# Patient Record
Sex: Female | Born: 1945 | Race: White | Hispanic: No | Marital: Married | Smoking: Never smoker
Health system: Southern US, Community
[De-identification: ages and names within clinical notes are randomized; demographics above are authoritative.]

## PROBLEM LIST (undated history)

## (undated) DIAGNOSIS — M1990 Unspecified osteoarthritis, unspecified site: Secondary | ICD-10-CM

## (undated) DIAGNOSIS — E049 Nontoxic goiter, unspecified: Secondary | ICD-10-CM

## (undated) DIAGNOSIS — E039 Hypothyroidism, unspecified: Secondary | ICD-10-CM

## (undated) DIAGNOSIS — E7800 Pure hypercholesterolemia, unspecified: Secondary | ICD-10-CM

## (undated) DIAGNOSIS — C4491 Basal cell carcinoma of skin, unspecified: Secondary | ICD-10-CM

## (undated) DIAGNOSIS — F32A Depression, unspecified: Secondary | ICD-10-CM

## (undated) DIAGNOSIS — M8580 Other specified disorders of bone density and structure, unspecified site: Secondary | ICD-10-CM

## (undated) DIAGNOSIS — F319 Bipolar disorder, unspecified: Secondary | ICD-10-CM

## (undated) DIAGNOSIS — F329 Major depressive disorder, single episode, unspecified: Secondary | ICD-10-CM

## (undated) HISTORY — DX: Basal cell carcinoma of skin, unspecified: C44.91

## (undated) HISTORY — DX: Bipolar disorder, unspecified: F31.9

## (undated) HISTORY — DX: Nontoxic goiter, unspecified: E04.9

## (undated) HISTORY — PX: TUBAL LIGATION: SHX77

## (undated) HISTORY — DX: Depression, unspecified: F32.A

## (undated) HISTORY — PX: JOINT REPLACEMENT: SHX530

## (undated) HISTORY — DX: Other specified disorders of bone density and structure, unspecified site: M85.80

## (undated) HISTORY — DX: Pure hypercholesterolemia, unspecified: E78.00

## (undated) HISTORY — DX: Major depressive disorder, single episode, unspecified: F32.9

---

## 2004-01-22 ENCOUNTER — Ambulatory Visit: Payer: Self-pay | Admitting: Family Medicine

## 2005-03-28 ENCOUNTER — Ambulatory Visit: Payer: Self-pay | Admitting: Family Medicine

## 2006-04-03 ENCOUNTER — Ambulatory Visit: Payer: Self-pay | Admitting: Family Medicine

## 2006-04-10 ENCOUNTER — Ambulatory Visit: Payer: Self-pay | Admitting: Urology

## 2007-05-06 ENCOUNTER — Ambulatory Visit: Payer: Self-pay

## 2008-01-18 ENCOUNTER — Ambulatory Visit: Payer: Self-pay | Admitting: Otolaryngology

## 2008-03-17 HISTORY — PX: THYROID SURGERY: SHX805

## 2008-05-08 ENCOUNTER — Ambulatory Visit: Payer: Self-pay | Admitting: Family Medicine

## 2008-08-03 ENCOUNTER — Ambulatory Visit: Payer: Self-pay | Admitting: Otolaryngology

## 2009-05-09 ENCOUNTER — Ambulatory Visit: Payer: Self-pay | Admitting: Family Medicine

## 2009-08-21 ENCOUNTER — Ambulatory Visit: Payer: Self-pay | Admitting: Otolaryngology

## 2009-09-10 ENCOUNTER — Ambulatory Visit: Payer: Self-pay | Admitting: Otolaryngology

## 2009-09-13 ENCOUNTER — Ambulatory Visit: Payer: Self-pay | Admitting: Otolaryngology

## 2009-09-24 ENCOUNTER — Ambulatory Visit: Payer: Self-pay | Admitting: Otolaryngology

## 2009-09-25 LAB — PATHOLOGY REPORT

## 2010-05-10 ENCOUNTER — Ambulatory Visit: Payer: Self-pay | Admitting: Family Medicine

## 2011-06-06 ENCOUNTER — Ambulatory Visit: Payer: Self-pay | Admitting: Family Medicine

## 2012-06-07 ENCOUNTER — Ambulatory Visit: Payer: Self-pay | Admitting: Family Medicine

## 2012-06-08 ENCOUNTER — Ambulatory Visit: Payer: Self-pay | Admitting: Family Medicine

## 2013-05-17 ENCOUNTER — Ambulatory Visit: Payer: Self-pay | Admitting: Family Medicine

## 2013-06-08 ENCOUNTER — Ambulatory Visit: Payer: Self-pay | Admitting: Family Medicine

## 2014-03-29 ENCOUNTER — Ambulatory Visit: Payer: Self-pay | Admitting: Psychology

## 2014-03-31 ENCOUNTER — Ambulatory Visit: Payer: Self-pay | Admitting: Family Medicine

## 2014-03-31 DIAGNOSIS — J449 Chronic obstructive pulmonary disease, unspecified: Secondary | ICD-10-CM | POA: Diagnosis not present

## 2014-03-31 DIAGNOSIS — J439 Emphysema, unspecified: Secondary | ICD-10-CM | POA: Diagnosis not present

## 2014-03-31 DIAGNOSIS — J40 Bronchitis, not specified as acute or chronic: Secondary | ICD-10-CM | POA: Diagnosis not present

## 2014-03-31 DIAGNOSIS — J309 Allergic rhinitis, unspecified: Secondary | ICD-10-CM | POA: Diagnosis not present

## 2014-03-31 DIAGNOSIS — R05 Cough: Secondary | ICD-10-CM | POA: Diagnosis not present

## 2014-03-31 DIAGNOSIS — J4520 Mild intermittent asthma, uncomplicated: Secondary | ICD-10-CM | POA: Diagnosis not present

## 2014-04-28 DIAGNOSIS — F319 Bipolar disorder, unspecified: Secondary | ICD-10-CM | POA: Diagnosis not present

## 2014-04-28 DIAGNOSIS — Z0000 Encounter for general adult medical examination without abnormal findings: Secondary | ICD-10-CM | POA: Diagnosis not present

## 2014-04-28 DIAGNOSIS — E780 Pure hypercholesterolemia: Secondary | ICD-10-CM | POA: Diagnosis not present

## 2014-05-04 ENCOUNTER — Ambulatory Visit: Payer: Self-pay | Admitting: Family Medicine

## 2014-05-04 DIAGNOSIS — J449 Chronic obstructive pulmonary disease, unspecified: Secondary | ICD-10-CM | POA: Diagnosis not present

## 2014-05-04 DIAGNOSIS — F319 Bipolar disorder, unspecified: Secondary | ICD-10-CM | POA: Diagnosis not present

## 2014-05-04 DIAGNOSIS — R05 Cough: Secondary | ICD-10-CM | POA: Diagnosis not present

## 2014-05-04 DIAGNOSIS — E780 Pure hypercholesterolemia: Secondary | ICD-10-CM | POA: Diagnosis not present

## 2014-05-09 DIAGNOSIS — Z0000 Encounter for general adult medical examination without abnormal findings: Secondary | ICD-10-CM | POA: Diagnosis not present

## 2014-05-09 DIAGNOSIS — F319 Bipolar disorder, unspecified: Secondary | ICD-10-CM | POA: Diagnosis not present

## 2014-05-09 DIAGNOSIS — R05 Cough: Secondary | ICD-10-CM | POA: Diagnosis not present

## 2014-05-23 ENCOUNTER — Encounter: Payer: Self-pay | Admitting: Internal Medicine

## 2014-05-23 ENCOUNTER — Ambulatory Visit (INDEPENDENT_AMBULATORY_CARE_PROVIDER_SITE_OTHER): Payer: Commercial Managed Care - HMO | Admitting: Internal Medicine

## 2014-05-23 VITALS — BP 132/74 | HR 74 | Temp 98.2°F | Ht 66.5 in | Wt 166.0 lb

## 2014-05-23 DIAGNOSIS — R05 Cough: Secondary | ICD-10-CM

## 2014-05-23 DIAGNOSIS — J449 Chronic obstructive pulmonary disease, unspecified: Secondary | ICD-10-CM | POA: Insufficient documentation

## 2014-05-23 DIAGNOSIS — R059 Cough, unspecified: Secondary | ICD-10-CM | POA: Insufficient documentation

## 2014-05-23 DIAGNOSIS — J439 Emphysema, unspecified: Secondary | ICD-10-CM | POA: Diagnosis not present

## 2014-05-23 NOTE — Patient Instructions (Signed)
Follow up with Dr. Stevenson Clinch in 1 month - pulmonary function testing and 6 minute walk test prior to follow up - continue with flonase and singulair.

## 2014-05-24 ENCOUNTER — Encounter: Payer: Self-pay | Admitting: Internal Medicine

## 2014-05-24 NOTE — Progress Notes (Signed)
Date: 05/24/2014  MRN# [id number] Felicia Ross [birth year]  Referring Physician: Dr. Margarita Rana  Felicia Ross is a 69 y.o. old female seen in consultation for cough  CC: "cough for the past 1-2 years."  Chief Complaint  Patient presents with  . Advice Only    pt has a longstanding cough. She gets choked frequently after thyroid surgery (2011). Pt denies chest tightness, wheezing or mucus.    HPI:  Patient is a pleasant 69 year old female past medical history of cough, hematuria, hypercholesterolemia, thyroid goiter status post resection being evaluated for a cough over the past 23 years. Patient describes the cough as "tickle in throat" it's been more prominent over the past one year. She saw her primary care physician who obtained chest x-rays that showed hyperinflation and findings consistent with COPD. Over the past one to 2 months she's been started on Flonase, Singulair, and states that this has provided relief. Patient states that she has to chew hard candy (peppermint) which alleviates the cough and Susan throat. She cannot identify any factors that exacerbate her cough will make her cough better besides the peppermint. She does not endorse any cough relation to seasonal changes or environmental allergens. She is a never smoker, but does have secondhand smoke smoker for 19 years. 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 Cough is nonproductive he occur 3-5 minutes at a time and is intermittent. She states that after the thyroid surgery in 2011 the cough went away for a few months but then returned. No recent sick contacts or illnesses, no recent hospitalizations for bronchitis or any recurrent upper respiratory tract infections. Only symptoms patient endorses are cough, runny nose, and nasal congestion. She denies any dyspnea, wheezing, chills, fever, sore throat, pleuritic chest pain, vomiting, nausea.   PMHX:   Past Medical History  Diagnosis Date  . Allergic  bronchitis   . Bipolar disorder   . Depression   . Cough   . Edema   . Hematuria   . Hypercholesteremia   . Osteopenia   . SOB (shortness of breath)   . Thyroid goiter   . Allergic rhinitis    Surgical Hx:  Past Surgical History  Procedure Laterality Date  . Tubal ligation    . Thyroid surgery     Family Hx:  No family history on file. Social Hx:   History  Substance Use Topics  . Smoking status: Never Smoker   . Smokeless tobacco: Never Used  . Alcohol Use: No   Medication:   Current Outpatient Rx  Name  Route  Sig  Dispense  Refill  . cholecalciferol (VITAMIN D) 1000 UNITS tablet   Oral   Take 1,000 Units by mouth daily.         . fluticasone (FLONASE) 50 MCG/ACT nasal spray   Each Nare   Place 2 sprays into both nostrils daily.         Marland Kitchen levothyroxine (SYNTHROID, LEVOTHROID) 112 MCG tablet   Oral   Take 112 mcg by mouth daily.      1   . lithium carbonate (LITHOBID) 300 MG CR tablet   Oral   Take 300 mg by mouth 2 (two) times daily.         . mirtazapine (REMERON) 15 MG tablet   Oral   Take 15 mg by mouth at bedtime.         . montelukast (SINGULAIR) 10 MG tablet   Oral   Take 10 mg  by mouth daily.         . Multiple Vitamin (MULTIVITAMIN) tablet   Oral   Take 1 tablet by mouth daily.         . Omega-3 Fatty Acids (FISH OIL) 1200 MG CAPS   Oral   Take 1,200 mg by mouth daily.             Allergies:  Review of patient's allergies indicates no known allergies.  Review of Systems: Gen:  Denies  fever, sweats, chills HEENT: Denies blurred vision, double vision, ear pain, eye pain, hearing loss.  Admit to nasal congestion and runny nose Cvc:  No dizziness, chest pain or heaviness Resp:   cough Gi: Denies swallowing difficulty, stomach pain, nausea or vomiting, diarrhea, constipation, bowel incontinence Gu:  Denies bladder incontinence, burning urine Ext:   No Joint pain, stiffness or swelling Skin: No skin rash, easy bruising  or bleeding or hives Endoc:  No polyuria, polydipsia , polyphagia or weight change Psych: No depression, insomnia or hallucinations  Other:  All other systems negative  Physical Examination:   VS: BP 132/74 mmHg  Pulse 74  Temp(Src) 98.2 F (36.8 C) (Oral)  Ht 5' 6.5" (1.689 m)  Wt 166 lb (75.297 kg)  BMI 26.39 kg/m2  SpO2 97%  General Appearance: No distress  Neuro:without focal findings, mental status, speech normal, alert and oriented, cranial nerves 2-12 intact, reflexes normal and symmetric, sensation grossly normal  HEENT: PERRLA, EOM intact, no ptosis, no other lesions noticed; Mallampati  Pulmonary: normal breath sounds., diaphragmatic excursion normal.No wheezing, No rales;   Sputum Production:  None CardiovascularNormal S1,S2.  No m/r/g.  Abdominal aorta pulsation normal.    Abdomen: Benign, Soft, non-tender, No masses, hepatosplenomegaly, No lymphadenopathy Renal:  No costovertebral tenderness  GU:  No performed at this time. Endoc: No evident thyromegaly, no signs of acromegaly or Cushing features Skin:   warm, no rashes, no ecchymosis  Extremities: normal, no cyanosis, clubbing, no edema, warm with normal capillary refill. Other findings:   Rad results: (The following images and results were reviewed by Dr. Stevenson Clinch). CXR 05/04/14 CLINICAL DATA: History of COPD and chronic nonproductive cough for the past year  EXAM: CHEST 2 VIEW  COMPARISON: PA and lateral chest x-ray of March 31, 2014  FINDINGS: The lungs remain hyperinflated with mild hemidiaphragm flattening. There is no focal infiltrate. There is stable apical pleural thickening bilaterally. There is no pleural effusion. The heart and pulmonary vascularity are normal. The mediastinum is normal in width. There is gentle S-shaped thoracic scoliosis with mild degenerative disc disease at multiple levels.  IMPRESSION: COPD. There is no pneumonia nor other active cardiopulmonary disease.  03/31/14 CHEST 2  VIEW  COMPARISON: None.  FINDINGS: The lungs are hyperinflated. There is peribronchial thickening. No infiltrates or effusions. Slight irregular apical pleural thickening. Heart size and pulmonary vascularity are normal. No acute osseous abnormality.  IMPRESSION: Bronchitic changes with emphysema.     Assessment and Plan:69 year old female past medical history of hypothyroidism being evaluated for cough. Cough The standardized cough guidelines published in Chest by Lissa Morales in 2006 are still the best available and consist of a multiple step process (up to 12!) , not a single office visit,  and are intended  to address this problem logically,  with an alogrithm dependent on response to empiric treatment at  each progressive step  to determine a specific diagnosis with  minimal addtional testing needed. Therefore if adherence is an issue or can't be accurately  verified,  it's very unlikely the standard evaluation and treatment will be successful here.    Furthermore, response to therapy (other than acute cough suppression, which should only be used short term with avoidance of narcotic containing cough syrups if possible), can be a gradual process for which the patient may perceive immediate benefit.  Patient cough is likely secondary to allergies and COPD.  Plan: - See plan for COPD - continue with Flonase and Singulair    COPD (chronic obstructive pulmonary disease) Clinically only symptom is cough, however by imaging patient does have hyperinflated lungs and flattening of the diaphragms which will commonly seen in COPD patients. Patient does not have a smoking history only secondhand smoke exposure. While smoking is a common predisposition for COPD other factors include woodburning fuel, noxious airborne substances, secondhand smoke exposure.   Currently cough is not causing significant decrease in quality of life with daily activities therefore would not place patient on  maintenance daily therapy at this time.  Plan: -Pulmonary function testing and 6 minute walk test. -Continue supportive care.     Updated Medication List Outpatient Encounter Prescriptions as of 05/23/2014  Medication Sig  . cholecalciferol (VITAMIN D) 1000 UNITS tablet Take 1,000 Units by mouth daily.  . fluticasone (FLONASE) 50 MCG/ACT nasal spray Place 2 sprays into both nostrils daily.  Marland Kitchen levothyroxine (SYNTHROID, LEVOTHROID) 112 MCG tablet Take 112 mcg by mouth daily.  Marland Kitchen lithium carbonate (LITHOBID) 300 MG CR tablet Take 300 mg by mouth 2 (two) times daily.  . mirtazapine (REMERON) 15 MG tablet Take 15 mg by mouth at bedtime.  . montelukast (SINGULAIR) 10 MG tablet Take 10 mg by mouth daily.  . Multiple Vitamin (MULTIVITAMIN) tablet Take 1 tablet by mouth daily.  . Omega-3 Fatty Acids (FISH OIL) 1200 MG CAPS Take 1,200 mg by mouth daily.    Orders for this visit: Orders Placed This Encounter  Procedures  . Pulmonary function test    Standing Status: Future     Number of Occurrences:      Standing Expiration Date: 05/23/2015    Scheduling Instructions:     This has been scheduled for 06/27/14.    Order Specific Question:  Where should this test be performed?    Answer:  [facility]    Order Specific Question:  Full PFT: includes the following: basic spirometry, spirometry pre & post bronchodilator, diffusion capacity (DLCO), lung volumes    Answer:  Full PFT    Order Specific Question:  MIP/MEP    Answer:  No    Order Specific Question:  6 minute walk    Answer:  Yes    Order Specific Question:  ABG    Answer:  No    Order Specific Question:  Diffusion capacity (DLCO)    Answer:  Yes    Order Specific Question:  Lung volumes    Answer:  Yes    Order Specific Question:  Methacholine challenge    Answer:  No     Thank  you for the consultation and for allowing [facility] to assist in the care of your patient. Our recommendations are noted  above.  Please contact us if we can be of further service.   Vilinda Boehringer, MD [facility] Office Number: [phone]

## 2014-05-24 NOTE — Assessment & Plan Note (Signed)
Clinically only symptom is cough, however by imaging patient does have hyperinflated lungs and flattening of the diaphragms which will commonly seen in COPD patients. Patient does not have a smoking history only secondhand smoke exposure. While smoking is a common predisposition for COPD other factors include woodburning fuel, noxious airborne substances, secondhand smoke exposure.   Currently cough is not causing significant decrease in quality of life with daily activities therefore would not place patient on maintenance daily therapy at this time.  Plan: -Pulmonary function testing and 6 minute walk test. -Continue supportive care.

## 2014-05-24 NOTE — Assessment & Plan Note (Addendum)
The standardized cough guidelines published in Chest by Lissa Morales in 2006 are still the best available and consist of a multiple step process (up to 12!) , not a single office visit,  and are intended  to address this problem logically,  with an alogrithm dependent on response to empiric treatment at  each progressive step  to determine a specific diagnosis with  minimal addtional testing needed. Therefore if adherence is an issue or can't be accurately verified,  it's very unlikely the standard evaluation and treatment will be successful here.    Furthermore, response to therapy (other than acute cough suppression, which should only be used short term with avoidance of narcotic containing cough syrups if possible), can be a gradual process for which the patient may perceive immediate benefit.  Patient cough is likely secondary to allergies and COPD.  Plan: - See plan for COPD - continue with Flonase and Singulair

## 2014-06-12 ENCOUNTER — Ambulatory Visit: Payer: Self-pay | Admitting: Family Medicine

## 2014-06-12 DIAGNOSIS — Z1231 Encounter for screening mammogram for malignant neoplasm of breast: Secondary | ICD-10-CM | POA: Diagnosis not present

## 2014-06-20 DIAGNOSIS — E780 Pure hypercholesterolemia: Secondary | ICD-10-CM | POA: Diagnosis not present

## 2014-06-20 DIAGNOSIS — E049 Nontoxic goiter, unspecified: Secondary | ICD-10-CM | POA: Diagnosis not present

## 2014-06-27 ENCOUNTER — Ambulatory Visit: Payer: Commercial Managed Care - HMO

## 2014-06-28 ENCOUNTER — Ambulatory Visit: Payer: Commercial Managed Care - HMO | Admitting: Internal Medicine

## 2014-07-03 DIAGNOSIS — F319 Bipolar disorder, unspecified: Secondary | ICD-10-CM | POA: Diagnosis not present

## 2014-07-03 DIAGNOSIS — L237 Allergic contact dermatitis due to plants, except food: Secondary | ICD-10-CM | POA: Diagnosis not present

## 2014-07-03 DIAGNOSIS — Z0000 Encounter for general adult medical examination without abnormal findings: Secondary | ICD-10-CM | POA: Diagnosis not present

## 2014-07-04 DIAGNOSIS — L600 Ingrowing nail: Secondary | ICD-10-CM | POA: Diagnosis not present

## 2014-07-04 DIAGNOSIS — B351 Tinea unguium: Secondary | ICD-10-CM | POA: Diagnosis not present

## 2014-07-04 DIAGNOSIS — M79674 Pain in right toe(s): Secondary | ICD-10-CM | POA: Diagnosis not present

## 2014-07-04 DIAGNOSIS — M79675 Pain in left toe(s): Secondary | ICD-10-CM | POA: Diagnosis not present

## 2014-08-08 ENCOUNTER — Ambulatory Visit: Payer: Commercial Managed Care - HMO

## 2014-08-08 ENCOUNTER — Ambulatory Visit: Payer: Commercial Managed Care - HMO | Admitting: Internal Medicine

## 2014-08-09 ENCOUNTER — Ambulatory Visit: Payer: Commercial Managed Care - HMO | Admitting: Internal Medicine

## 2014-08-09 ENCOUNTER — Ambulatory Visit: Payer: Commercial Managed Care - HMO

## 2014-08-21 ENCOUNTER — Telehealth: Payer: Self-pay | Admitting: *Deleted

## 2014-08-21 NOTE — Telephone Encounter (Signed)
Called and spoke to patient's spouse.  Pt needs to come in at 8:30 instead of 9:00. Pts husband says he will relay the message.

## 2014-08-24 ENCOUNTER — Ambulatory Visit (INDEPENDENT_AMBULATORY_CARE_PROVIDER_SITE_OTHER): Payer: Commercial Managed Care - HMO | Admitting: Internal Medicine

## 2014-08-24 ENCOUNTER — Ambulatory Visit: Payer: Commercial Managed Care - HMO | Admitting: Internal Medicine

## 2014-08-24 ENCOUNTER — Encounter: Payer: Self-pay | Admitting: Internal Medicine

## 2014-08-24 VITALS — BP 134/82 | HR 72 | Temp 98.0°F | Ht 67.0 in | Wt 167.0 lb

## 2014-08-24 DIAGNOSIS — R05 Cough: Secondary | ICD-10-CM

## 2014-08-24 DIAGNOSIS — R059 Cough, unspecified: Secondary | ICD-10-CM

## 2014-08-24 DIAGNOSIS — J439 Emphysema, unspecified: Secondary | ICD-10-CM | POA: Diagnosis not present

## 2014-08-24 DIAGNOSIS — J449 Chronic obstructive pulmonary disease, unspecified: Secondary | ICD-10-CM

## 2014-08-24 LAB — PULMONARY FUNCTION TEST
DL/VA % pred: 94 %
DL/VA: 4.86 ml/min/mmHg/L
DLCO unc % pred: 123 %
DLCO unc: 34.98 ml/min/mmHg
FEF 25-75 Post: 1.38 L/sec
FEF 25-75 Pre: 1.09 L/sec
FEF2575-%Change-Post: 26 %
FEF2575-%Pred-Post: 62 %
FEF2575-%Pred-Pre: 49 %
FEV1-%Change-Post: 7 %
FEV1-%Pred-Post: 69 %
FEV1-%Pred-Pre: 65 %
FEV1-Post: 1.84 L
FEV1-Pre: 1.72 L
FEV1FVC-%Change-Post: 5 %
FEV1FVC-%Pred-Pre: 91 %
FEV6-%Change-Post: 1 %
FEV6-%Pred-Post: 75 %
FEV6-%Pred-Pre: 74 %
FEV6-Post: 2.51 L
FEV6-Pre: 2.48 L
FEV6FVC-%Change-Post: 0 %
FEV6FVC-%Pred-Post: 103 %
FEV6FVC-%Pred-Pre: 104 %
FVC-%Change-Post: 1 %
FVC-%Pred-Post: 73 %
FVC-%Pred-Pre: 71 %
FVC-Post: 2.52 L
Post FEV1/FVC ratio: 73 %
Post FEV6/FVC ratio: 100 %
Pre FEV1/FVC ratio: 69 %
Pre FEV6/FVC Ratio: 100 %

## 2014-08-24 MED ORDER — TIOTROPIUM BROMIDE MONOHYDRATE 2.5 MCG/ACT IN AERS: 2.0000 | INHALATION_SPRAY | Freq: Every day | RESPIRATORY_TRACT | Status: DC

## 2014-08-24 NOTE — Progress Notes (Signed)
SMW performed today. 

## 2014-08-24 NOTE — Progress Notes (Signed)
MRN# [id number] Felicia Ross [birth year]   CC: Chief Complaint  Patient presents with  . Follow-up    f/u PFT/SMW. Pt says she a hacking cough, throat gets dry. Pt has sl. chest tightness and sob but denies wheezing.      Brief History: [facility] March 2016 Patient is a pleasant 69 year old female past medical history of cough, hematuria, hypercholesterolemia, thyroid goiter status post resection being evaluated for a cough over the past 23 years. Patient describes the cough as "tickle in throat" it's been more prominent over the past one year. She saw her primary care physician who obtained chest x-rays that showed hyperinflation and findings consistent with COPD. Over the past one to 2 months she's been started on Flonase, Singulair, and states that this has provided relief. Patient states that she has to chew hard candy (peppermint) which alleviates the cough and Susan throat. She cannot identify any factors that exacerbate her cough will make her cough better besides the peppermint. She does not endorse any cough relation to seasonal changes or environmental allergens. She is a never smoker, but does have secondhand smoke smoker for 19 years. 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 Cough is nonproductive he occur 3-5 minutes at a time and is intermittent. She states that after the thyroid surgery in 2011 the cough went away for a few months but then returned. No recent sick contacts or illnesses, no recent hospitalizations for bronchitis or any recurrent upper respiratory tract infections. Only symptoms patient endorses are cough, runny nose, and nasal congestion. She denies any dyspnea, wheezing, chills, fever, sore throat, pleuritic chest pain, vomiting, nausea. Plan: Continue with Flonase and Singulair as directed, pulmonary function testing and 6 minute walk test prior to follow-up visit.  Events since last clinic visit:   Patient presents today for followup visit of her  cough. Her last visit she was advised to continue with Singulair and Flonase, currently she states that she is doing well she has a slight cough. She also had some questions about a lung cancer screening and if she would be an appropriate candidate. She denies any hemoptysis, fever, night sweats, rapid weight loss. Today she also had her pulmonary function testing and her 6 minute walk test performed.   Medication:   Current Outpatient Rx  Name  Route  Sig  Dispense  Refill  . cholecalciferol (VITAMIN D) 1000 UNITS tablet   Oral   Take 1,000 Units by mouth daily.         . fluticasone (FLONASE) 50 MCG/ACT nasal spray   Each Nare   Place 2 sprays into both nostrils daily.         Marland Kitchen levothyroxine (SYNTHROID, LEVOTHROID) 112 MCG tablet   Oral   Take 112 mcg by mouth daily.      1   . lithium carbonate (LITHOBID) 300 MG CR tablet   Oral   Take 300 mg by mouth 2 (two) times daily.         . mirtazapine (REMERON) 15 MG tablet   Oral   Take 15 mg by mouth at bedtime.         . montelukast (SINGULAIR) 10 MG tablet   Oral   Take 10 mg by mouth daily.         . Multiple Vitamin (MULTIVITAMIN) tablet   Oral   Take 1 tablet by mouth daily.         . Omega-3 Fatty Acids (FISH OIL) 1200 MG  CAPS   Oral   Take 1,200 mg by mouth daily.            Review of Systems: Gen:  Denies  fever, sweats, chills HEENT: Denies blurred vision, double vision, ear pain, eye pain, hearing loss, nose bleeds, sore throat Cvc:  No dizziness, chest pain or heaviness Resp:   Admits SH:FWYO cough, nonproductive Gi: Denies swallowing difficulty, stomach pain, nausea or vomiting, diarrhea, constipation, bowel incontinence Gu:  Denies bladder incontinence, burning urine Ext:   No Joint pain, stiffness or swelling Skin: No skin rash, easy bruising or bleeding or hives Endoc:  No polyuria, polydipsia , polyphagia or weight change Other:  All other systems negative  Allergies:  Review of  patient's allergies indicates no known allergies.  Physical Examination:  VS: BP 134/82 mmHg  Pulse 72  Temp(Src) 98 F (36.7 C) (Oral)  Ht 5\' 7"  (1.702 m)  Wt 167 lb (75.751 kg)  BMI 26.15 kg/m2  SpO2 95%  General Appearance: No distress  HEENT: PERRLA, no ptosis, no other lesions noticed Pulmonary:normal breath sounds., diaphragmatic excursion normal.No wheezing, No rales   Cardiovascular:  Normal S1,S2.  No m/r/g.     Abdomen:Exam: Benign, Soft, non-tender, No masses  Skin:   warm, no rashes, no ecchymosis  Extremities: normal, no cyanosis, clubbing, warm with normal capillary refill.     pulmonary function tests 08/24/2014 FEC 71% FEV1 65% FEV1/FVC 69% RV 100% TLC 86% RV/TLC 124% ERV 42% DLCO corrected 123% Impression: Moderate obstructive process, no significant bronchodilator response, significant drop in ERV. 6 minute walk test: 300 cm/1017 feet lowest saturation 95%, highest heart rate 77, no complaints after the test.  Assessment and Plan:69 year old female following up for cough and pulmonary function testing. COPD (chronic obstructive pulmonary disease) Clinically only symptom is cough, however by imaging patient does have hyperinflated lungs and flattening of the diaphragms which will commonly seen in COPD patients. Pulmonary function tests with moderate obstruction, however only symptom is cough. Patient is more likely mild, class B. COPD. Will give Spiriva trial. Patient does not have a smoking history only secondhand smoke exposure, low-risk candidate for lung cancer screening Cough is resolving well at this time.   Plan: -Continue with Flonase and Singulair as directed -Continue to avoid any forms of secondhand smoking -Spiriva sample, this is a trial basis for 2 weeks-Spiriva 2.47mcg (2 puffs in the morning, gargle and rinse after each use)-if she had improvement in cough with Spiriva then will call in a prescription for it.     Updated Medication  List Outpatient Encounter Prescriptions as of 08/24/2014  Medication Sig  . cholecalciferol (VITAMIN D) 1000 UNITS tablet Take 1,000 Units by mouth daily.  . fluticasone (FLONASE) 50 MCG/ACT nasal spray Place 2 sprays into both nostrils daily.  Marland Kitchen levothyroxine (SYNTHROID, LEVOTHROID) 112 MCG tablet Take 112 mcg by mouth daily.  Marland Kitchen lithium carbonate (LITHOBID) 300 MG CR tablet Take 300 mg by mouth 2 (two) times daily.  . mirtazapine (REMERON) 15 MG tablet Take 15 mg by mouth at bedtime.  . montelukast (SINGULAIR) 10 MG tablet Take 10 mg by mouth daily.  . Multiple Vitamin (MULTIVITAMIN) tablet Take 1 tablet by mouth daily.  . Omega-3 Fatty Acids (FISH OIL) 1200 MG CAPS Take 1,200 mg by mouth daily.   No facility-administered encounter medications on file as of 08/24/2014.    Orders for this visit: No orders of the defined types were placed in this encounter.    Thank  you for  the visitation and for allowing  [facility] to assist in the care of your patient. Our recommendations are noted above.  Please contact us if we can be of further service.  Vilinda Boehringer, MD [facility] Office Number: [phone]

## 2014-08-24 NOTE — Progress Notes (Signed)
PFT performed today. 

## 2014-08-24 NOTE — Patient Instructions (Signed)
Follow-up with Dr. Stevenson Clinch in 6 months. -Continue with Flonase and Singulair as directed -Continue to avoid any forms of secondhand smoking -We will give you a Spiriva sample, this is a trial basis for 2 weeks-Spiriva 2.8mcg (2 puffs in the morning, gargle and rinse after each use)-if he have improvement in your cough with Spiriva please inform us, and we will call in a prescription for it.

## 2014-08-25 ENCOUNTER — Telehealth: Payer: Self-pay | Admitting: Internal Medicine

## 2014-08-25 NOTE — Telephone Encounter (Signed)
Patient says that she received the Spiriva Respimat yesterday and she is not sure how to use it.  Walked patient through step by step instructions on how to use Respimat.  Patient says that she thinks she has the hang of it. Nothing further needed.

## 2014-08-30 NOTE — Assessment & Plan Note (Signed)
Clinically only symptom is cough, however by imaging patient does have hyperinflated lungs and flattening of the diaphragms which will commonly seen in COPD patients. Pulmonary function tests with moderate obstruction, however only symptom is cough. Patient is more likely mild, class B. COPD. Will give Spiriva trial. Patient does not have a smoking history only secondhand smoke exposure, low-risk candidate for lung cancer screening Cough is resolving well at this time.   Plan: -Continue with Flonase and Singulair as directed -Continue to avoid any forms of secondhand smoking -Spiriva sample, this is a trial basis for 2 weeks-Spiriva 2.96mcg (2 puffs in the morning, gargle and rinse after each use)-if she had improvement in cough with Spiriva then will call in a prescription for it.

## 2014-09-11 ENCOUNTER — Telehealth: Payer: Self-pay | Admitting: Internal Medicine

## 2014-09-11 NOTE — Telephone Encounter (Signed)
lmtcb

## 2014-09-11 NOTE — Telephone Encounter (Signed)
Advised patient to give Spiriva a 3 full weeks. Also, reiterate proper technique. Her last visit was 08/24/14, therefore I would like her to use spiriva (with proper technique) throughout the first week of July, if no benefit at that time, then stop.

## 2014-09-11 NOTE — Telephone Encounter (Signed)
Called patient back, she is having trouble with the Spiriva Respimat.  Spoke with Davy Pique, advised patient to come by office to have Sonya show her how to use the Respimat properly.  Patient thinks there is something wrong with the sample given.  She will bring it with her so Davy Pique can check on it.   FYI to [employer]

## 2014-09-11 NOTE — Telephone Encounter (Signed)
Spoke with pt. States that she was given Spiriva Respimat at her last OV. This did not help her cough or throat dryness. Was instructed to let us know whether she noticed any improvement or not.  Dr. Stevenson Clinch - please advise. Thanks.

## 2014-09-11 NOTE — Telephone Encounter (Signed)
[phone], pt cb

## 2014-09-11 NOTE — Telephone Encounter (Signed)
[phone], pt cb

## 2014-09-11 NOTE — Telephone Encounter (Signed)
Discussed technique with patient: Apparently patient is having trouble with respimat, she releases the medication before she puts in her mouth.  I went through step by step procedure over the phone advising patient to avoid gray button that dispenses medication.  She finds it hard to avoid pressing the gray button when she turns the device.  Patient has not been getting the medication into her lungs at all, she has been inhaling after medication has been dispensed into the air.  Patient tried again while I was on the phone with her and she said she thinks she has it figured out, she said she will call us back and let us know if it is working in July as per VM's recommendations.  FYI to VM

## 2014-09-14 NOTE — Telephone Encounter (Signed)
Pt stopped by the office to get another demonstration of how the [place] works. Pt was not using inhaler correctly therefore medication was being wasted. I demonstrated how to properly use inhaler and gave her another inhaler.

## 2014-09-14 NOTE — Telephone Encounter (Signed)
Patient reported to Sharyn Lull that she would not be able to come by office until today Thursday 09/14/14. Will leave message open until issue resolved.

## 2014-09-14 NOTE — Telephone Encounter (Signed)
Sonya please advise if this message can be closed.  Thanks!

## 2014-09-15 ENCOUNTER — Ambulatory Visit (INDEPENDENT_AMBULATORY_CARE_PROVIDER_SITE_OTHER): Payer: Commercial Managed Care - HMO | Admitting: Family Medicine

## 2014-09-15 ENCOUNTER — Encounter: Payer: Self-pay | Admitting: Family Medicine

## 2014-09-15 VITALS — BP 128/68 | HR 80 | Temp 98.1°F | Resp 16 | Ht 67.0 in | Wt 167.0 lb

## 2014-09-15 DIAGNOSIS — M8580 Other specified disorders of bone density and structure, unspecified site: Secondary | ICD-10-CM | POA: Insufficient documentation

## 2014-09-15 DIAGNOSIS — M949 Disorder of cartilage, unspecified: Secondary | ICD-10-CM

## 2014-09-15 DIAGNOSIS — R319 Hematuria, unspecified: Secondary | ICD-10-CM | POA: Insufficient documentation

## 2014-09-15 DIAGNOSIS — F329 Major depressive disorder, single episode, unspecified: Secondary | ICD-10-CM | POA: Insufficient documentation

## 2014-09-15 DIAGNOSIS — M899 Disorder of bone, unspecified: Secondary | ICD-10-CM | POA: Insufficient documentation

## 2014-09-15 DIAGNOSIS — L03031 Cellulitis of right toe: Secondary | ICD-10-CM | POA: Insufficient documentation

## 2014-09-15 DIAGNOSIS — E010 Iodine-deficiency related diffuse (endemic) goiter: Secondary | ICD-10-CM | POA: Insufficient documentation

## 2014-09-15 DIAGNOSIS — R609 Edema, unspecified: Secondary | ICD-10-CM | POA: Insufficient documentation

## 2014-09-15 DIAGNOSIS — IMO0002 Reserved for concepts with insufficient information to code with codable children: Secondary | ICD-10-CM | POA: Insufficient documentation

## 2014-09-15 DIAGNOSIS — J309 Allergic rhinitis, unspecified: Secondary | ICD-10-CM | POA: Insufficient documentation

## 2014-09-15 DIAGNOSIS — F32A Depression, unspecified: Secondary | ICD-10-CM | POA: Insufficient documentation

## 2014-09-15 DIAGNOSIS — E7800 Pure hypercholesterolemia, unspecified: Secondary | ICD-10-CM | POA: Insufficient documentation

## 2014-09-15 DIAGNOSIS — F319 Bipolar disorder, unspecified: Secondary | ICD-10-CM | POA: Insufficient documentation

## 2014-09-15 MED ORDER — DOXYCYCLINE HYCLATE 100 MG PO TABS: 100.0000 mg | ORAL_TABLET | Freq: Two times a day (BID) | ORAL | Status: DC

## 2014-09-15 NOTE — Progress Notes (Signed)
Subjective:    Patient ID: Felicia Ross, female    DOB: Jan 07, 1946, 69 y.o.   MRN: [id number]  Toe Pain  The incident occurred more than 1 week ago (Started about three weeks ago.). There was no injury mechanism. The pain is present in the right toes. The quality of the pain is described as stabbing. The pain is at a severity of 10/10. The pain is severe. The pain has been worsening since onset. Pertinent negatives include no inability to bear weight (Can walk; but is very painful), loss of motion, loss of sensation, muscle weakness, numbness or tingling. It is unknown if a foreign body is present. The symptoms are aggravated by weight bearing, palpation and movement. Treatments tried: peroxide and neosporin. The treatment provided no relief.   Patient Active Problem List   Diagnosis Date Noted  . Shadow 09/15/2014  . Affective bipolar disorder 09/15/2014  . Clinical depression 09/15/2014  . Accumulation of fluid in tissues 09/15/2014  . Blood in the urine 09/15/2014  . Hypercholesteremia 09/15/2014  . Osteopenia 09/15/2014  . Big thyroid 09/15/2014  . Allergic rhinitis 09/15/2014  . Paronychia of great toe, right 09/15/2014  . Cough 05/23/2014  . COPD (chronic obstructive pulmonary disease) 05/23/2014   Family History  Problem Relation Age of Onset  . Ovarian cancer Mother   . Hypertension Mother   . Heart attack Father   . Hypertension Brother   . Breast cancer Maternal Aunt   . Lung cancer Paternal Aunt   . Hypertension Other   . Breast cancer Other     Runs on Paternal and Maternal side   History   Social History  . Marital Status: Married    Spouse Name: Lewis   . Number of Children: 3  . Years of Education: College   Occupational History  . Part-time    Social History Main Topics  . Smoking status: Never Smoker   . Smokeless tobacco: Never Used  . Alcohol Use: No  . Drug Use: No  . Sexual Activity: Not on file   Other Topics Concern  . Not on file    Social History Narrative   Past Surgical History  Procedure Laterality Date  . Tubal ligation    . Thyroid surgery     No Known Allergies Previous Medications   CHOLECALCIFEROL (VITAMIN D) 1000 UNITS TABLET    Take 1,000 Units by mouth daily.   FLUTICASONE (FLONASE) 50 MCG/ACT NASAL SPRAY    Place 2 sprays into both nostrils daily.   LEVOTHYROXINE (SYNTHROID, LEVOTHROID) 112 MCG TABLET    Take 112 mcg by mouth daily.   LITHIUM CARBONATE (LITHOBID) 300 MG CR TABLET    Take 300 mg by mouth 2 (two) times daily.   MIRTAZAPINE (REMERON) 15 MG TABLET    Take 15 mg by mouth at bedtime.   MONTELUKAST (SINGULAIR) 10 MG TABLET    Take 10 mg by mouth daily.   MULTIPLE VITAMIN (MULTIVITAMIN) TABLET    Take 1 tablet by mouth daily.   OMEGA-3 FATTY ACIDS (FISH OIL) 1200 MG CAPS    Take 1,200 mg by mouth daily.   TIOTROPIUM BROMIDE MONOHYDRATE (SPIRIVA RESPIMAT) 2.5 MCG/ACT AERS    Inhale 2 puffs into the lungs daily.   BP 128/68 mmHg  Pulse 80  Temp(Src) 98.1 F (36.7 C) (Oral)  Resp 16  Ht 5\' 7"  (1.702 m)  Wt 167 lb (75.751 kg)  BMI 26.15 kg/m2    Review of Systems  Constitutional:  Negative for fever, chills, diaphoresis, activity change, appetite change, fatigue and unexpected weight change.  Musculoskeletal: Positive for myalgias and arthralgias. Negative for back pain, joint swelling, gait problem, neck pain and neck stiffness.  Neurological: Negative for tingling and numbness.       Objective:   Physical Exam  Constitutional: She is oriented to person, place, and time. She appears well-developed and well-nourished.  Neurological: She is alert and oriented to person, place, and time.  Skin: Skin is warm.  Flesh colored firm growth at the top of right great toe, with some erythema and peeling skin around it. Tender.      Assessment & Plan:   1. Paronychia of great toe, right  Worsening Will start medication below and refer to podiatry for more definite treatment. Call if  worsens before sees podiatry, especially redness and fever.  - doxycycline (VIBRA-TABS) 100 MG tablet; Take 1 tablet (100 mg total) by mouth 2 (two) times daily.  Dispense: 20 tablet; Refill: 0 - Ambulatory referral to Podiatry  Margarita Rana, MD

## 2014-09-20 ENCOUNTER — Other Ambulatory Visit: Payer: Self-pay | Admitting: Internal Medicine

## 2014-09-20 MED ORDER — TIOTROPIUM BROMIDE MONOHYDRATE 2.5 MCG/ACT IN AERS: 2.0000 | INHALATION_SPRAY | Freq: Every day | RESPIRATORY_TRACT | Status: DC

## 2014-09-20 NOTE — Telephone Encounter (Signed)
Pt education of Spiriva Respimat 2.5 mcg Pt was not using inhaler correctly. She was twisting while the cap was open therefore medication was expressing. Pt was re-instructed on proper technique and given a 2nd sample. Nothing further needed.

## 2014-09-27 DIAGNOSIS — L03011 Cellulitis of right finger: Secondary | ICD-10-CM | POA: Diagnosis not present

## 2014-09-27 DIAGNOSIS — L600 Ingrowing nail: Secondary | ICD-10-CM | POA: Diagnosis not present

## 2014-10-11 DIAGNOSIS — L03011 Cellulitis of right finger: Secondary | ICD-10-CM | POA: Diagnosis not present

## 2014-10-11 DIAGNOSIS — L600 Ingrowing nail: Secondary | ICD-10-CM | POA: Diagnosis not present

## 2015-01-30 ENCOUNTER — Other Ambulatory Visit: Payer: Self-pay | Admitting: Family Medicine

## 2015-01-30 DIAGNOSIS — F3170 Bipolar disorder, currently in remission, most recent episode unspecified: Secondary | ICD-10-CM

## 2015-01-30 DIAGNOSIS — E039 Hypothyroidism, unspecified: Secondary | ICD-10-CM

## 2015-04-06 ENCOUNTER — Other Ambulatory Visit: Payer: Self-pay | Admitting: Family Medicine

## 2015-04-06 DIAGNOSIS — Z1231 Encounter for screening mammogram for malignant neoplasm of breast: Secondary | ICD-10-CM

## 2015-04-16 ENCOUNTER — Encounter: Payer: Self-pay | Admitting: Internal Medicine

## 2015-04-16 ENCOUNTER — Ambulatory Visit (INDEPENDENT_AMBULATORY_CARE_PROVIDER_SITE_OTHER): Payer: PPO | Admitting: Internal Medicine

## 2015-04-16 VITALS — BP 130/68 | HR 69 | Ht 67.0 in | Wt 164.4 lb

## 2015-04-16 DIAGNOSIS — R05 Cough: Secondary | ICD-10-CM | POA: Diagnosis not present

## 2015-04-16 DIAGNOSIS — R059 Cough, unspecified: Secondary | ICD-10-CM

## 2015-04-16 DIAGNOSIS — J439 Emphysema, unspecified: Secondary | ICD-10-CM | POA: Diagnosis not present

## 2015-04-16 MED ORDER — UMECLIDINIUM BROMIDE 62.5 MCG/INH IN AEPB: 1.0000 | INHALATION_SPRAY | Freq: Every day | RESPIRATORY_TRACT | Status: AC

## 2015-04-16 NOTE — Progress Notes (Signed)
MRN# [id number] Felicia Ross 09-21-1945   CC: Chief Complaint  Patient presents with  . Follow-up    pt. states breathing is baseline. dry cough. denies SOB, wheezing or chest pain/tightness.      Brief History: [facility] March 2016 Patient is a pleasant 70 year old female past medical history of cough, hematuria, hypercholesterolemia, thyroid goiter status post resection being evaluated for a cough over the past 23 years. Patient describes the cough as "tickle in throat" it's been more prominent over the past one year. She saw her primary care physician who obtained chest x-rays that showed hyperinflation and findings consistent with COPD. Over the past one to 2 months she's been started on Flonase, Singulair, and states that this has provided relief. Patient states that she has to chew hard candy (peppermint) which alleviates the cough and Susan throat. She cannot identify any factors that exacerbate her cough will make her cough better besides the peppermint. She does not endorse any cough relation to seasonal changes or environmental allergens. She is a never smoker, but does have secondhand smoke smoker for 19 years. 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 Cough is nonproductive he occur 3-5 minutes at a time and is intermittent. She states that after the thyroid surgery in 2011 the cough went away for a few months but then returned. No recent sick contacts or illnesses, no recent hospitalizations for bronchitis or any recurrent upper respiratory tract infections. Only symptoms patient endorses are cough, runny nose, and nasal congestion. She denies any dyspnea, wheezing, chills, fever, sore throat, pleuritic chest pain, vomiting, nausea. Plan: Continue with Flonase and Singulair as directed, pulmonary function testing and 6 minute walk test prior to follow-up visit.  ROV 08/24/14:  Patient presents today for followup visit of her cough. Her last visit she was advised to continue  with Singulair and Flonase, currently she states that she is doing well she has a slight cough. She also had some questions about a lung cancer screening and if she would be an appropriate candidate. She denies any hemoptysis, fever, night sweats, rapid weight loss. Today she also had her pulmonary function testing and her 6 minute walk test performed. Plan: -Continue with Flonase and Singulair as directed -Continue to avoid any forms of secondhand smoking -Spiriva sample, this is a trial basis for 2 weeks-Spiriva 2.107mcg (2 puffs in the morning, gargle and rinse after each use)-if she had improvement in cough with Spiriva then will call in a prescription for it.  Events since last clinic visit: She presents today for follow-up visit of her cough. At her last visit she was started on a trial of Spiriva, she stated provided no benefit and she stopped the medication. She still having dry intermittent cough but no significant shortness of breath or dyspnea on exertion. She denies any fever, chills, or malaise.  Medication:   Current Outpatient Rx  Name  Route  Sig  Dispense  Refill  . cholecalciferol (VITAMIN D) 1000 UNITS tablet   Oral   Take 1,000 Units by mouth daily.         . fluticasone (FLONASE) 50 MCG/ACT nasal spray   Each Nare   Place 2 sprays into both nostrils daily.         Marland Kitchen levothyroxine (SYNTHROID, LEVOTHROID) 112 MCG tablet      TAKE 1 (ONE) TABLET DAILY   90 tablet   3   . lithium carbonate (LITHOBID) 300 MG CR tablet  TAKE 1 TABLET TWICE DAILY   180 tablet   3   . mirtazapine (REMERON) 15 MG tablet   Oral   Take 15 mg by mouth at bedtime.         . montelukast (SINGULAIR) 10 MG tablet   Oral   Take 10 mg by mouth daily.         . Multiple Vitamin (MULTIVITAMIN) tablet   Oral   Take 1 tablet by mouth daily.         . Omega-3 Fatty Acids (FISH OIL) 1200 MG CAPS   Oral   Take 1,200 mg by mouth daily.         Marland Kitchen Umeclidinium Bromide  (INCRUSE ELLIPTA) 62.5 MCG/INH AEPB   Inhalation   Inhale 1 puff into the lungs daily.   14 each   0      Review of Systems: Gen:  Denies  fever, sweats, chills HEENT: Denies blurred vision, double vision, ear pain, eye pain, hearing loss, nose bleeds, sore throat Cvc:  No dizziness, chest pain or heaviness Resp:   Admits [id number] cough, nonproductive Gi: Denies swallowing difficulty, stomach pain, nausea or vomiting, diarrhea, constipation, bowel incontinence Gu:  Denies bladder incontinence, burning urine Ext:   No Joint pain, stiffness or swelling Skin: No skin rash, easy bruising or bleeding or hives Endoc:  No polyuria, polydipsia , polyphagia or weight change Other:  All other systems negative  Allergies:  Review of patient's allergies indicates no known allergies.  Physical Examination:  VS: BP 130/68 mmHg  Pulse 69  Ht 5\' 7"  (1.702 m)  Wt 164 lb 6.4 oz (74.571 kg)  BMI 25.74 kg/m2  SpO2 96%  General Appearance: No distress  HEENT: PERRLA, no ptosis, no other lesions noticed Pulmonary:normal breath sounds., diaphragmatic excursion normal.No wheezing, No rales   Cardiovascular:  Normal S1,S2.  No m/r/g.     Abdomen:Exam: Benign, Soft, non-tender, No masses  Skin:   warm, no rashes, no ecchymosis  Extremities: normal, no cyanosis, clubbing, warm with normal capillary refill.     pulmonary function tests 08/24/2014 FEC 71% FEV1 65% FEV1/FVC 69% RV 100% TLC 86% RV/TLC 124% ERV 42% DLCO corrected 123% Impression: Moderate obstructive process, no significant bronchodilator response, significant drop in ERV. 6 minute walk test: 300 cm/1017 feet lowest saturation 95%, highest heart rate 77, no complaints after the test.  Assessment and Plan:30 70 year old female moderate obstructive pulmonary process, presenting for follow-up of chronic cough.  COPD (chronic obstructive pulmonary disease) Clinically only symptom is cough, however by imaging patient does have  hyperinflated lungs and flattening of the diaphragms which will commonly seen in COPD patients. Pulmonary function tests with moderate obstruction, however only symptom is cough. Patient is more likely mild, class B. COPD. No benefit from Spiriva trial in the past. Will give 2 week trial of Incruse.  Patient does not have a smoking history only secondhand smoke exposure, low-risk candidate for lung cancer screening Cough is stable at this time.    Plan: -Continue with Flonase and Singulair as directed -Continue to avoid any forms of secondhand smoking -Incruse sample, this is a trial basis for 2 weeks-if she has improvement in cough with Incruse then will call in a prescription for it.     Cough The standardized cough guidelines published in Chest by Lissa Morales in 2006 are still the best available and consist of a multiple step process (up to 12!) , not a single office visit,  and are intended  to address this problem logically,  with an alogrithm dependent on response to empiric treatment at  each progressive step  to determine a specific diagnosis with  minimal addtional testing needed. Therefore if adherence is an issue or can't be accurately verified,  it's very unlikely the standard evaluation and treatment will be successful here.    Furthermore, response to therapy (other than acute cough suppression, which should only be used short term with avoidance of narcotic containing cough syrups if possible), can be a gradual process for which the patient may perceive immediate benefit.  Patient cough is likely secondary to allergies and COPD.  Plan: - See plan for COPD - continue with Flonase and Singulair       Updated Medication List Outpatient Encounter Prescriptions as of 04/16/2015  Medication Sig  . cholecalciferol (VITAMIN D) 1000 UNITS tablet Take 1,000 Units by mouth daily.  . fluticasone (FLONASE) 50 MCG/ACT nasal spray Place 2 sprays into both nostrils daily.  Marland Kitchen  levothyroxine (SYNTHROID, LEVOTHROID) 112 MCG tablet TAKE 1 (ONE) TABLET DAILY  . lithium carbonate (LITHOBID) 300 MG CR tablet TAKE 1 TABLET TWICE DAILY  . mirtazapine (REMERON) 15 MG tablet Take 15 mg by mouth at bedtime.  . montelukast (SINGULAIR) 10 MG tablet Take 10 mg by mouth daily.  . Multiple Vitamin (MULTIVITAMIN) tablet Take 1 tablet by mouth daily.  . Omega-3 Fatty Acids (FISH OIL) 1200 MG CAPS Take 1,200 mg by mouth daily.  Marland Kitchen Umeclidinium Bromide (INCRUSE ELLIPTA) 62.5 MCG/INH AEPB Inhale 1 puff into the lungs daily.  . [DISCONTINUED] doxycycline (VIBRA-TABS) 100 MG tablet Take 1 tablet (100 mg total) by mouth 2 (two) times daily. (Patient not taking: Reported on 04/16/2015)  . [DISCONTINUED] Tiotropium Bromide Monohydrate (SPIRIVA RESPIMAT) 2.5 MCG/ACT AERS Inhale 2 puffs into the lungs daily.   No facility-administered encounter medications on file as of 04/16/2015.    Orders for this visit: No orders of the defined types were placed in this encounter.    Thank  you for the visitation and for allowing  [facility] to assist in the care of your patient. Our recommendations are noted above.  Please contact us if we can be of further service.  Vilinda Boehringer, MD [facility] Office Number: [phone]

## 2015-04-16 NOTE — Assessment & Plan Note (Signed)
Clinically only symptom is cough, however by imaging patient does have hyperinflated lungs and flattening of the diaphragms which will commonly seen in COPD patients. Pulmonary function tests with moderate obstruction, however only symptom is cough. Patient is more likely mild, class B. COPD. No benefit from Spiriva trial in the past. Will give 2 week trial of Incruse.  Patient does not have a smoking history only secondhand smoke exposure, low-risk candidate for lung cancer screening Cough is stable at this time.    Plan: -Continue with Flonase and Singulair as directed -Continue to avoid any forms of secondhand smoking -Incruse sample, this is a trial basis for 2 weeks-if she has improvement in cough with Incruse then will call in a prescription for it.

## 2015-04-16 NOTE — Assessment & Plan Note (Signed)
The standardized cough guidelines published in Chest by Richard Irwin in 2006 are still the best available and consist of a multiple step process (up to 12!) , not a single office visit,  and are intended  to address this problem logically,  with an alogrithm dependent on response to empiric treatment at  each progressive step  to determine a specific diagnosis with  minimal addtional testing needed. Therefore if adherence is an issue or can't be accurately verified,  it's very unlikely the standard evaluation and treatment will be successful here.    Furthermore, response to therapy (other than acute cough suppression, which should only be used short term with avoidance of narcotic containing cough syrups if possible), can be a gradual process for which the patient may perceive immediate benefit.  Patient cough is likely secondary to allergies and COPD.  Plan: - See plan for COPD - continue with Flonase and Singulair  

## 2015-04-16 NOTE — Patient Instructions (Signed)
Follow up with Dr. Stevenson Clinch in: 6 months - stop spiriva - we will give you a trial Incruse  - 1 puff daily for 2 weeks, if you are getting improvement then call us back to get a prescription - cont with allergy avoidance.

## 2015-04-27 ENCOUNTER — Telehealth: Payer: Self-pay

## 2015-04-27 NOTE — Telephone Encounter (Signed)
Pt informed. Nothing further needed. 

## 2015-04-27 NOTE — Telephone Encounter (Signed)
Pt states the trial Incruse did not help. Please call.

## 2015-04-27 NOTE — Telephone Encounter (Signed)
Complete 2 week trial, if no benefit then stop.  Nothing further. Cont with allergy control.  Thank you

## 2015-04-27 NOTE — Telephone Encounter (Signed)
Pt states she doesn't feel the trial of Incruse has made any difference. Please advise on next step.

## 2015-04-27 NOTE — Telephone Encounter (Signed)
Called pt. No answer. Unable to leave message due to no voicemail.

## 2015-05-02 ENCOUNTER — Encounter: Payer: Self-pay | Admitting: Family Medicine

## 2015-05-02 ENCOUNTER — Ambulatory Visit (INDEPENDENT_AMBULATORY_CARE_PROVIDER_SITE_OTHER): Payer: PPO | Admitting: Family Medicine

## 2015-05-02 VITALS — BP 120/72 | HR 64 | Temp 98.1°F | Resp 16 | Ht 66.0 in | Wt 162.0 lb

## 2015-05-02 DIAGNOSIS — E7800 Pure hypercholesterolemia, unspecified: Secondary | ICD-10-CM | POA: Diagnosis not present

## 2015-05-02 DIAGNOSIS — F319 Bipolar disorder, unspecified: Secondary | ICD-10-CM | POA: Diagnosis not present

## 2015-05-02 DIAGNOSIS — Z1211 Encounter for screening for malignant neoplasm of colon: Secondary | ICD-10-CM | POA: Diagnosis not present

## 2015-05-02 DIAGNOSIS — Z8041 Family history of malignant neoplasm of ovary: Secondary | ICD-10-CM | POA: Diagnosis not present

## 2015-05-02 DIAGNOSIS — Z0000 Encounter for general adult medical examination without abnormal findings: Secondary | ICD-10-CM | POA: Diagnosis not present

## 2015-05-02 DIAGNOSIS — E039 Hypothyroidism, unspecified: Secondary | ICD-10-CM

## 2015-05-02 DIAGNOSIS — F3110 Bipolar disorder, current episode manic without psychotic features, unspecified: Secondary | ICD-10-CM

## 2015-05-02 LAB — IFOBT (OCCULT BLOOD): IFOBT: NEGATIVE

## 2015-05-02 NOTE — Progress Notes (Signed)
Patient ID: Felicia Ross, female   DOB: [birth year], 70 y.o.   MRN: [id number]       Patient: Felicia Ross, Female    DOB: 22-Mar-1945, 70 y.o.   MRN: [id number] Visit Date: 05/02/2015  Today's Provider: Margarita Rana, MD   Chief Complaint  Patient presents with  . Medicare Wellness   Subjective:    Annual wellness visit Felicia Ross is a 70 y.o. female. She feels well. She reports exercising 5 days a week. She reports she is sleeping fairly well.  04/28/14 CPE 06/12/14 Mammo 03/30/09 Colon-Normal 05/23/13 BMD-normal -----------------------------------------------------------   Review of Systems  Constitutional: Negative.   HENT: Positive for rhinorrhea.   Eyes: Negative.   Respiratory: Positive for cough.   Cardiovascular: Negative.   Gastrointestinal: Negative.   Endocrine: Negative.   Genitourinary: Negative.   Musculoskeletal: Negative.   Skin: Negative.   Allergic/Immunologic: Negative.   Neurological: Negative.   Hematological: Negative.   Psychiatric/Behavioral: Negative.     Social History   Social History  . Marital Status: Married    Spouse Name: Lewis   . Number of Children: 3  . Years of Education: College   Occupational History  . Part-time    Social History Main Topics  . Smoking status: Never Smoker   . Smokeless tobacco: Never Used  . Alcohol Use: No  . Drug Use: No  . Sexual Activity: Not on file   Other Topics Concern  . Not on file   Social History Narrative    Past Medical History  Diagnosis Date  . Allergic bronchitis   . Bipolar disorder ([place])   . Depression   . Cough   . Edema   . Hematuria   . Hypercholesteremia   . Osteopenia   . SOB (shortness of breath)   . Thyroid goiter   . Allergic rhinitis      Patient Active Problem List   Diagnosis Date Noted  . Hypothyroidism 01/30/2015  . Shadow 09/15/2014  . Affective bipolar disorder ([place]) 09/15/2014  . Clinical depression 09/15/2014  . Accumulation of  fluid in tissues 09/15/2014  . Blood in the urine 09/15/2014  . Hypercholesteremia 09/15/2014  . Osteopenia 09/15/2014  . Big thyroid 09/15/2014  . Allergic rhinitis 09/15/2014  . Paronychia of great toe, right 09/15/2014  . Bone/cartilage disorder 09/15/2014  . Edema 09/15/2014  . Major depressive disorder with single episode ([place]) 09/15/2014  . Cough 05/23/2014  . COPD (chronic obstructive pulmonary disease) ([place]) 05/23/2014    Past Surgical History  Procedure Laterality Date  . Tubal ligation    . Thyroid surgery  2010    Her family history includes Breast cancer in her maternal aunt and other; Heart attack in her father; Hypertension in her brother, mother, and other; Lung cancer in her paternal aunt; Ovarian cancer in her mother.    Previous Medications   CHOLECALCIFEROL (VITAMIN D) 1000 UNITS TABLET    Take 1,000 Units by mouth daily.   FLUTICASONE (FLONASE) 50 MCG/ACT NASAL SPRAY    Place 2 sprays into both nostrils as needed.    LEVOTHYROXINE (SYNTHROID, LEVOTHROID) 112 MCG TABLET    TAKE 1 (ONE) TABLET DAILY   LITHIUM CARBONATE (LITHOBID) 300 MG CR TABLET    TAKE 1 TABLET TWICE DAILY   MIRTAZAPINE (REMERON) 15 MG TABLET    Take 15 mg by mouth at bedtime.   MULTIPLE VITAMIN (MULTIVITAMIN) TABLET    Take 1 tablet by mouth daily.   OMEGA-3 FATTY ACIDS (  FISH OIL) 1200 MG CAPS    Take 1,200 mg by mouth daily.    Patient Care Team: Margarita Rana, MD as PCP - General (Family Medicine)     Objective:   Vitals: BP 120/72 mmHg  Pulse 64  Temp(Src) 98.1 F (36.7 C) (Oral)  Resp 16  Ht 5\' 6"  (1.676 m)  Wt 162 lb (73.483 kg)  BMI 26.16 kg/m2  SpO2 96%  Physical Exam  Constitutional: She is oriented to person, place, and time. She appears well-developed and well-nourished.  HENT:  Head: Normocephalic and atraumatic.  Right Ear: Tympanic membrane, external ear and ear canal normal.  Left Ear: Tympanic membrane, external ear and ear canal normal.  Nose: Nose normal.    Mouth/Throat: Uvula is midline, oropharynx is clear and moist and mucous membranes are normal.  Eyes: Conjunctivae, EOM and lids are normal. Pupils are equal, round, and reactive to light.  Neck: Trachea normal and normal range of motion. Neck supple. Carotid bruit is not present. No thyroid mass and no thyromegaly present.  Cardiovascular: Normal rate, regular rhythm and normal heart sounds.   Pulmonary/Chest: Effort normal and breath sounds normal.  Abdominal: Soft. Normal appearance and bowel sounds are normal. There is no hepatosplenomegaly. There is no tenderness.  Genitourinary: Rectum normal. No breast swelling, tenderness or discharge.  Musculoskeletal: Normal range of motion.  Lymphadenopathy:    She has no cervical adenopathy.    She has no axillary adenopathy.  Neurological: She is alert and oriented to person, place, and time. She has normal strength. No cranial nerve deficit.  Skin: Skin is warm, dry and intact.  Psychiatric: She has a normal mood and affect. Her speech is normal and behavior is normal. Judgment and thought content normal. Cognition and memory are normal.    Activities of Daily Living In your present state of health, do you have any difficulty performing the following activities: 05/02/2015  Hearing? N  Vision? N  Difficulty concentrating or making decisions? N  Walking or climbing stairs? N  Dressing or bathing? N  Doing errands, shopping? N    Fall Risk Assessment Fall Risk  05/02/2015  Falls in the past year? No     Depression Screen PHQ 2/9 Scores 05/02/2015  PHQ - 2 Score 0    Cognitive Testing - 6-CIT  Correct? Score   What year is it? yes 0 0 or 4  What month is it? yes 0 0 or 3  Memorize:    Felicia Ross,  43,  [address]  [place],      What time is it? (within 1 hour) yes 0 0 or 3  Count backwards from 20 yes 0 0, 2, or 4  Name the months of the year yes 0 0, 2, or 4  Repeat name & address above no 4 0, 2, 4, 6, 8, or 10       TOTAL  SCORE  4/28   Interpretation:  Normal  Normal (0-7) Abnormal (8-28)       Assessment & Plan:     Annual Wellness Visit  Reviewed patient's Family Medical History Reviewed and updated list of patient's medical providers Assessment of cognitive impairment was done Assessed patient's functional ability Established a written schedule for health screening [place] Completed and Reviewed  Exercise Activities and Dietary recommendations Goals    None      Immunization History  Administered Date(s) Administered  . Influenza Split 01/16/2015  . Influenza-Unspecified 12/22/2013  .  Pneumococcal Conjugate-13 05/30/2013  . Pneumococcal Polysaccharide-23 04/21/2012  . Pneumococcal-Unspecified 01/05/2013  . Tdap 01/20/2005  . Zoster 04/02/2007       1. Medicare annual wellness visit, subsequent Stable. Patient advised to continue eating healthy and exercise daily. Will check labs and send medication to mail order when labs are back.   2. Hypothyroidism, unspecified hypothyroidism type - CBC with Differential/Platelet - TSH  3. Hypercholesteremia - Comprehensive metabolic panel - Lipid Panel With LDL/HDL Ratio  4. Colon cancer screening - IFOBT POC (occult bld, rslt in office)  5. Bipolar affective disorder, current episode manic, current episode severity unspecified ([place]) - Lithium level     Patient seen and examined by Dr. Jerrell Belfast, and note scribed by Philbert Riser. Dim[place], CMA.  I have reviewed the document for accuracy and completeness and I agree with above. Jerrell Belfast, MD   Margarita Rana, MD   ------------------------------------------------------------------------------------------------------------

## 2015-05-03 ENCOUNTER — Telehealth: Payer: Self-pay

## 2015-05-03 DIAGNOSIS — F3170 Bipolar disorder, currently in remission, most recent episode unspecified: Secondary | ICD-10-CM

## 2015-05-03 DIAGNOSIS — E039 Hypothyroidism, unspecified: Secondary | ICD-10-CM

## 2015-05-03 LAB — COMPREHENSIVE METABOLIC PANEL
ALT: 15 IU/L (ref 0–32)
AST: 17 IU/L (ref 0–40)
Albumin/Globulin Ratio: 1.8 (ref 1.1–2.5)
Albumin: 4.8 g/dL (ref 3.6–4.8)
Alkaline Phosphatase: 98 IU/L (ref 39–117)
BUN/Creatinine Ratio: 23 (ref 11–26)
BUN: 21 mg/dL (ref 8–27)
Bilirubin Total: 0.8 mg/dL (ref 0.0–1.2)
CO2: 26 mmol/L (ref 18–29)
Calcium: 10.3 mg/dL (ref 8.7–10.3)
Chloride: 100 mmol/L (ref 96–106)
Creatinine, Ser: 0.9 mg/dL (ref 0.57–1.00)
GFR calc Af Amer: 76 mL/min/{1.73_m2} (ref >59)
GFR calc non Af Amer: 66 mL/min/{1.73_m2} (ref >59)
Globulin, Total: 2.6 g/dL (ref 1.5–4.5)
Glucose: 102 mg/dL — ABNORMAL HIGH (ref 65–99)
Potassium: 5 mmol/L (ref 3.5–5.2)
Sodium: 142 mmol/L (ref 134–144)
Total Protein: 7.4 g/dL (ref 6.0–8.5)

## 2015-05-03 LAB — CBC WITH DIFFERENTIAL/PLATELET
Basophils Absolute: 0 10*3/uL (ref 0.0–0.2)
Basos: 1 %
EOS (ABSOLUTE): 0.1 10*3/uL (ref 0.0–0.4)
Eos: 1 %
Hematocrit: 44.7 % (ref 34.0–46.6)
Hemoglobin: 14.9 g/dL (ref 11.1–15.9)
Immature Grans (Abs): 0 10*3/uL (ref 0.0–0.1)
Immature Granulocytes: 0 %
Lymphocytes Absolute: 2.5 10*3/uL (ref 0.7–3.1)
Lymphs: 34 %
MCH: 30.1 pg (ref 26.6–33.0)
MCHC: 33.3 g/dL (ref 31.5–35.7)
MCV: 90 fL (ref 79–97)
Monocytes Absolute: 0.5 10*3/uL (ref 0.1–0.9)
Monocytes: 7 %
Neutrophils Absolute: 4.3 10*3/uL (ref 1.4–7.0)
Neutrophils: 57 %
Platelets: 209 10*3/uL (ref 150–379)
RBC: 4.95 x10E6/uL (ref 3.77–5.28)
RDW: 13.7 % (ref 12.3–15.4)
WBC: 7.5 10*3/uL (ref 3.4–10.8)

## 2015-05-03 LAB — LIPID PANEL WITH LDL/HDL RATIO
Cholesterol, Total: 254 mg/dL — ABNORMAL HIGH (ref 100–199)
HDL: 62 mg/dL (ref >39)
LDL Calculated: 160 mg/dL — ABNORMAL HIGH (ref 0–99)
LDl/HDL Ratio: 2.6 ratio units (ref 0.0–3.2)
Triglycerides: 159 mg/dL — ABNORMAL HIGH (ref 0–149)
VLDL Cholesterol Cal: 32 mg/dL (ref 5–40)

## 2015-05-03 LAB — TSH: TSH: 0.248 u[IU]/mL — ABNORMAL LOW (ref 0.450–4.500)

## 2015-05-03 LAB — LITHIUM LEVEL: Lithium Lvl: 0.4 mmol/L — ABNORMAL LOW (ref 0.6–1.4)

## 2015-05-03 MED ORDER — LITHIUM CARBONATE ER 300 MG PO TBCR: 300.0000 mg | EXTENDED_RELEASE_TABLET | Freq: Two times a day (BID) | ORAL | Status: DC

## 2015-05-03 MED ORDER — MIRTAZAPINE 15 MG PO TABS: 15.0000 mg | ORAL_TABLET | Freq: Every day | ORAL | Status: DC

## 2015-05-03 MED ORDER — LEVOTHYROXINE SODIUM 100 MCG PO TABS: ORAL_TABLET | ORAL | Status: DC

## 2015-05-03 NOTE — Telephone Encounter (Signed)
-----   Message from Margarita Rana, MD sent at 05/03/2015  7:12 AM EST ----- Thyroid slightly overcorrected. Would recommend decrease Levothyroxine to 100 mcg and recheck level in 6 weeks.   Cholesterol is elevated.10 year risk of heart disease is at 7 percent. Right below threshold for recommending starting a medication. Can start a medication if has strong family history or concern, or can work on diet and exercise.  Also, glucose is very mildly elevated at 102. Diet and exercise for that and recheck in annually. Thanks.

## 2015-05-03 NOTE — Telephone Encounter (Signed)
Pt advised as below. Patient reports she will work on diet and exercise.

## 2015-05-09 ENCOUNTER — Telehealth: Payer: Self-pay | Admitting: Family Medicine

## 2015-05-09 DIAGNOSIS — F3170 Bipolar disorder, currently in remission, most recent episode unspecified: Secondary | ICD-10-CM

## 2015-05-09 MED ORDER — MIRTAZAPINE 15 MG PO TABS: 15.0000 mg | ORAL_TABLET | Freq: Every day | ORAL | Status: DC

## 2015-05-09 NOTE — Telephone Encounter (Signed)
Reordered Mirtazapine with 3 refills to [facility]. Also, contacted [employer] for pre-authorization form for CA125 lab. Is faxing form. Will complete when fax is sent. Renaldo Fiddler, CMA

## 2015-05-09 NOTE — Telephone Encounter (Signed)
Faxed form. It was faxed back stating the CA 125 test did not need pre-authorization. Informed pt to proceed with lab draw. Renaldo Fiddler, CMA

## 2015-05-09 NOTE — Telephone Encounter (Signed)
Pt called saying there is a mix up on the amounts of her medication that was sent in to [facility]. She had several issues going on with her prescriptions.    Can you please call her back at [phone]  Thanks Con Memos

## 2015-05-15 ENCOUNTER — Telehealth: Payer: Self-pay

## 2015-05-15 LAB — CA 125: CA 125: 9.1 U/mL (ref 0.0–38.1)

## 2015-05-15 NOTE — Telephone Encounter (Signed)
-----   Message from Margarita Rana, MD sent at 05/15/2015  7:05 AM EST ----- CA125 normal. Thanks.

## 2015-05-15 NOTE — Telephone Encounter (Signed)
Advised pt of lab results. Pt verbally acknowledges understanding. Reade Trefz Drozdowski, CMA   

## 2015-06-13 ENCOUNTER — Ambulatory Visit
Admission: RE | Admit: 2015-06-13 | Discharge: 2015-06-13 | Disposition: A | Discharge status: Home / Self Care | Payer: PPO | Source: Ambulatory Visit | Attending: Family Medicine | Admitting: Family Medicine

## 2015-06-13 DIAGNOSIS — Z1231 Encounter for screening mammogram for malignant neoplasm of breast: Secondary | ICD-10-CM

## 2015-06-25 ENCOUNTER — Telehealth: Payer: Self-pay | Admitting: Family Medicine

## 2015-06-25 DIAGNOSIS — E039 Hypothyroidism, unspecified: Secondary | ICD-10-CM

## 2015-06-25 NOTE — Telephone Encounter (Signed)
Ok to order. Thanks.   

## 2015-06-25 NOTE — Telephone Encounter (Signed)
Pt would like to pick up a lab slip tomorrow morning 06/26/15 to have her labs rechecked. Message in pt's chart from 05/03/15 that she was supposed to get labs rechecked in 6 weeks from then. Please advise. Thanks TNP

## 2015-06-25 NOTE — Telephone Encounter (Signed)
Looks like just TSH needs to be checked, please review-aa

## 2015-06-26 ENCOUNTER — Other Ambulatory Visit: Payer: Self-pay

## 2015-06-26 DIAGNOSIS — E039 Hypothyroidism, unspecified: Secondary | ICD-10-CM

## 2015-06-26 MED ORDER — LEVOTHYROXINE SODIUM 100 MCG PO TABS: ORAL_TABLET | ORAL | Status: DC

## 2015-06-26 NOTE — Telephone Encounter (Signed)
Pt advised lab slip up front-aa and RX refilled

## 2015-06-27 ENCOUNTER — Telehealth: Payer: Self-pay

## 2015-06-27 LAB — TSH: TSH: 0.935 u[IU]/mL (ref 0.450–4.500)

## 2015-06-27 NOTE — Telephone Encounter (Signed)
-----   Message from Margarita Rana, MD sent at 06/27/2015  9:13 AM EDT ----- Thyroid stable. Thanks.

## 2015-06-27 NOTE — Telephone Encounter (Signed)
Patient advised as directed below. 

## 2015-10-29 ENCOUNTER — Telehealth: Payer: Self-pay

## 2015-10-29 DIAGNOSIS — E039 Hypothyroidism, unspecified: Secondary | ICD-10-CM

## 2015-10-29 NOTE — Telephone Encounter (Signed)
This is a Dr. [name] with hypothyroidism c/o excessive sweating. Has recently changed pharmacies. Was reading that Levothyroxine could cause the sweating. Is requesting to check TSH. Is this okay to order? Renaldo Fiddler, CMA

## 2015-10-29 NOTE — Telephone Encounter (Signed)
Please advise 

## 2015-10-29 NOTE — Telephone Encounter (Signed)
Left detailed message on pt's vm

## 2015-10-29 NOTE — Telephone Encounter (Signed)
OK to order TSH

## 2015-10-30 ENCOUNTER — Telehealth: Payer: Self-pay

## 2015-10-30 LAB — TSH: TSH: 0.896 u[IU]/mL (ref 0.450–4.500)

## 2015-10-30 NOTE — Telephone Encounter (Signed)
Pt advised.   Thanks,   -Irby Fails  

## 2015-10-30 NOTE — Telephone Encounter (Signed)
-----   Message from Birdie Sons, MD sent at 10/30/2015  8:18 AM EDT ----- tsh is normal. Continue current dose of levothyroxine

## 2016-01-29 ENCOUNTER — Ambulatory Visit (INDEPENDENT_AMBULATORY_CARE_PROVIDER_SITE_OTHER): Payer: PPO | Admitting: Internal Medicine

## 2016-01-29 ENCOUNTER — Encounter: Payer: Self-pay | Admitting: Internal Medicine

## 2016-01-29 DIAGNOSIS — M8589 Other specified disorders of bone density and structure, multiple sites: Secondary | ICD-10-CM

## 2016-01-29 DIAGNOSIS — F319 Bipolar disorder, unspecified: Secondary | ICD-10-CM | POA: Diagnosis not present

## 2016-01-29 DIAGNOSIS — E890 Postprocedural hypothyroidism: Secondary | ICD-10-CM

## 2016-01-29 DIAGNOSIS — F3110 Bipolar disorder, current episode manic without psychotic features, unspecified: Secondary | ICD-10-CM

## 2016-01-29 DIAGNOSIS — E7800 Pure hypercholesterolemia, unspecified: Secondary | ICD-10-CM | POA: Diagnosis not present

## 2016-01-29 NOTE — Assessment & Plan Note (Signed)
Continue current dose of Synthroid Will check TSH and T4 at next visit

## 2016-01-29 NOTE — Assessment & Plan Note (Signed)
Continue Calcium and Vit D for now Repeat bone density in 2020

## 2016-01-29 NOTE — Assessment & Plan Note (Signed)
LDL high She does not want to start cholesterol lowering medication at this time Continue Fish Oil for now Will check CMET and Lipid profile at next visit

## 2016-01-29 NOTE — Assessment & Plan Note (Signed)
Controlled on Lithium and Mirtazapine Will check Lithium level and CMET at next visit

## 2016-01-29 NOTE — Patient Instructions (Signed)
Fat and Cholesterol Restricted Diet Introduction Getting too much fat and cholesterol in your diet may cause health problems. Following this diet helps keep your fat and cholesterol at normal levels. This can keep you from getting sick. What types of fat should I choose?  Choose monosaturated and polyunsaturated fats. These are found in foods such as olive oil, canola oil, flaxseeds, walnuts, almonds, and seeds.  Eat more omega-3 fats. Good choices include salmon, mackerel, sardines, tuna, flaxseed oil, and ground flaxseeds.  Limit saturated fats. These are in animal products such as meats, butter, and cream. They can also be in plant products such as palm oil, palm kernel oil, and coconut oil.  Avoid foods with partially hydrogenated oils in them. These contain trans fats. Examples of foods that have trans fats are stick margarine, some tub margarines, cookies, crackers, and other baked goods. What general guidelines do I need to follow?  Check food labels. Look for the words "trans fat" and "saturated fat."  When preparing a meal:  Fill half of your plate with vegetables and green salads.  Fill one fourth of your plate with whole grains. Look for the word "whole" as the first word in the ingredient list.  Fill one fourth of your plate with lean protein foods.  Eat more foods that have fiber, like apples, carrots, beans, peas, and barley.  Eat more home-cooked foods. Eat less at restaurants and buffets.  Limit or avoid alcohol.  Limit foods high in starch and sugar.  Limit fried foods.  Cook foods without frying them. Baking, boiling, grilling, and broiling are all great options.  Lose weight if you are overweight. Losing even a small amount of weight can help your overall health. It can also help prevent diseases such as diabetes and heart disease. What foods can I eat? Grains  Whole grains, such as whole wheat or whole grain breads, crackers, cereals, and pasta. Unsweetened  oatmeal, bulgur, barley, quinoa, or brown rice. Corn or whole wheat flour tortillas. Vegetables  Fresh or frozen vegetables (raw, steamed, roasted, or grilled). Green salads. Fruits  All fresh, canned (in natural juice), or frozen fruits. Meat and Other Protein Products  Ground beef (85% or leaner), grass-fed beef, or beef trimmed of fat. Skinless chicken or turkey. Ground chicken or turkey. Pork trimmed of fat. All fish and seafood. Eggs. Dried beans, peas, or lentils. Unsalted nuts or seeds. Unsalted canned or dry beans. Dairy  Low-fat dairy products, such as skim or 1% milk, 2% or reduced-fat cheeses, low-fat ricotta or cottage cheese, or plain low-fat yogurt. Fats and Oils  Tub margarines without trans fats. Light or reduced-fat mayonnaise and salad dressings. Avocado. Olive, canola, sesame, or safflower oils. Natural peanut or almond butter (choose ones without added sugar and oil). The items listed above may not be a complete list of recommended foods or beverages. Contact your dietitian for more options.  What foods are not recommended? Grains  White bread. White pasta. White rice. Cornbread. Bagels, pastries, and croissants. Crackers that contain trans fat. Vegetables  White potatoes. Corn. Creamed or fried vegetables. Vegetables in a cheese sauce. Fruits  Dried fruits. Canned fruit in light or heavy syrup. Fruit juice. Meat and Other Protein Products  Fatty cuts of meat. Ribs, chicken wings, bacon, sausage, bologna, salami, chitterlings, fatback, hot dogs, bratwurst, and packaged luncheon meats. Liver and organ meats. Dairy  Whole or 2% milk, cream, half-and-half, and cream cheese. Whole milk cheeses. Whole-fat or sweetened yogurt. Full-fat cheeses. Nondairy creamers and   whipped toppings. Processed cheese, cheese spreads, or cheese curds. Sweets and Desserts  Corn syrup, sugars, honey, and molasses. Candy. Jam and jelly. Syrup. Sweetened cereals. Cookies, pies, cakes, donuts,  muffins, and ice cream. Fats and Oils  Butter, stick margarine, lard, shortening, ghee, or bacon fat. Coconut, palm kernel, or palm oils. Beverages  Alcohol. Sweetened drinks (such as sodas, lemonade, and fruit drinks or punches). The items listed above may not be a complete list of foods and beverages to avoid. Contact your dietitian for more information.  This information is not intended to replace advice given to you by your health care provider. Make sure you discuss any questions you have with your health care provider. Document Released: 09/02/2011 Document Revised: 11/08/2015 Document Reviewed: 06/02/2013  2017 Elsevier  

## 2016-01-29 NOTE — Progress Notes (Signed)
HPI:  Pt presents to the clinic today to establish care and for management of the conditions listed below. She is transferring care from [facility].   Bipolar Depressive Disorder: She does not follow with psychiatry. She is taking Lithium and Mirtazapine as prescribed with good results. She denies SI/HI.  HLD: She does not take any cholesterol lowering medications, but does take Fish Oil daily. She thinks it was last checked 04/2015, but can not remember the results. Per EPIC, LDL was 160, and triglycerides were 159.  Osteopenia: Her last bone density exam was 05/2013. She takes a Calcium and Vit D supplement OTC.  Hypothyroidism: s/p thyroidectomy. She is taking Levothyroxine as prescribed. Her levels were were last checked 10/29/2015- normal.  Flu: 12/19/2015 Tetanus: 01/2005 Pneumovax: 12/2012 Prevnar: 05/2013 Zostovax: 03/2007 Pap Smear: > 5 years ago Mammogram: 05/2015 Bone Density: 05/2013 Colon Screening: 2011 [facility] Screening: as needed Dentist: biannually  Past Medical History:  Diagnosis Date  . Allergic bronchitis   . Allergic rhinitis   . Bipolar disorder ([place])   . Cough   . Depression   . Edema   . Hematuria   . Hypercholesteremia   . Osteopenia   . SOB (shortness of breath)   . Thyroid goiter     Current Outpatient Prescriptions  Medication Sig Dispense Refill  . cholecalciferol (VITAMIN D) 1000 UNITS tablet Take 1,000 Units by mouth daily.    Marland Kitchen levothyroxine (SYNTHROID, LEVOTHROID) 100 MCG tablet TAKE 1 (ONE) TABLET DAILY 90 tablet 3  . lithium carbonate (LITHOBID) 300 MG CR tablet Take 1 tablet (300 mg total) by mouth 2 (two) times daily. 180 tablet 3  . mirtazapine (REMERON) 15 MG tablet Take 1 tablet (15 mg total) by mouth at bedtime. 90 tablet 3  . Multiple Vitamin (MULTIVITAMIN) tablet Take 1 tablet by mouth daily.    . Omega-3 Fatty Acids (FISH OIL) 1200 MG CAPS Take 1,200 mg by mouth 2 (two) times daily.      No current  facility-administered medications for this visit.     Allergies  Allergen Reactions  . Fluzone [Flu Virus Vaccine] Hives    High dose only---hives, itchy    Family History  Problem Relation Age of Onset  . Ovarian cancer Mother   . Hypertension Mother   . Heart attack Father   . Hypertension Brother   . Breast cancer Maternal Aunt   . Lung cancer Paternal Aunt   . Breast cancer Paternal Aunt   . Hypertension Other   . Breast cancer Cousin     Social History   Social History  . Marital status: Married    Spouse name: Lewis   . Number of children: 3  . Years of education: College   Occupational History  . Part-time    Social History Main Topics  . Smoking status: Never Smoker  . Smokeless tobacco: Never Used  . Alcohol use No  . Drug use: No  . Sexual activity: Not on file   Other Topics Concern  . Not on file   Social History Narrative  . No narrative on file    ROS:  Constitutional: Denies fever, malaise, fatigue, headache or abrupt weight changes.  Respiratory: Denies difficulty breathing, shortness of breath, cough or sputum production.   Cardiovascular: Denies chest pain, chest tightness, palpitations or swelling in the hands or feet.  Musculoskeletal: Denies decrease in range of motion, difficulty with gait, muscle pain or joint pain and swelling.  Skin: Denies redness, rashes, lesions  or ulcercations.  Neurological: Denies dizziness, difficulty with memory, difficulty with speech or problems with balance and coordination.  Psych: Pt has history of depression. Denies anxiety, SI/HI.  No other specific complaints in a complete review of systems (except as listed in HPI above).  PE:  BP 122/74   Pulse 68   Temp 98.7 F (37.1 C) (Oral)   Ht 5\' 6"  (1.676 m)   Wt 169 lb (76.7 kg)   SpO2 98%   BMI 27.28 kg/m  Wt Readings from Last 3 Encounters:  01/29/16 169 lb (76.7 kg)  05/02/15 162 lb (73.5 kg)  04/16/15 164 lb 6.4 oz (74.6 kg)    General:  Appears her stated age, well developed, well nourished in NAD. Skin: Dry and intact. Cardiovascular: Normal rate and rhythm. S1,S2 noted.  No murmur, rubs or gallops noted. . Pulmonary/Chest: Normal effort and positive vesicular breath sounds. No respiratory distress. No wheezes, rales or ronchi noted.  Musculoskeletal:  No difficulty with gait.  Neurological: Alert and oriented.  Psychiatric: Mood and affect normal. Behavior is normal. Judgment and thought content normal.     BMET    Component Value Date/Time   NA 142 05/02/2015 1103   K 5.0 05/02/2015 1103   CL 100 05/02/2015 1103   CO2 26 05/02/2015 1103   GLUCOSE 102 (H) 05/02/2015 1103   BUN 21 05/02/2015 1103   CREATININE 0.90 05/02/2015 1103   CALCIUM 10.3 05/02/2015 1103   GFRNONAA 66 05/02/2015 1103   GFRAA 76 05/02/2015 1103    Lipid Panel     Component Value Date/Time   CHOL 254 (H) 05/02/2015 1103   TRIG 159 (H) 05/02/2015 1103   HDL 62 05/02/2015 1103   LDLCALC 160 (H) 05/02/2015 1103    CBC    Component Value Date/Time   WBC 7.5 05/02/2015 1103   RBC 4.95 05/02/2015 1103   HCT 44.7 05/02/2015 1103   PLT 209 05/02/2015 1103   MCV 90 05/02/2015 1103   MCH 30.1 05/02/2015 1103   MCHC 33.3 05/02/2015 1103   RDW 13.7 05/02/2015 1103   LYMPHSABS 2.5 05/02/2015 1103   EOSABS 0.1 05/02/2015 1103   BASOSABS 0.0 05/02/2015 1103    Hgb A1C No results found for: HGBA1C   Assessment and Plan:

## 2016-03-28 ENCOUNTER — Other Ambulatory Visit: Payer: Self-pay

## 2016-03-28 DIAGNOSIS — F3170 Bipolar disorder, currently in remission, most recent episode unspecified: Secondary | ICD-10-CM

## 2016-03-28 MED ORDER — MIRTAZAPINE 15 MG PO TABS: 15.0000 mg | ORAL_TABLET | Freq: Every day | ORAL | 0 refills | Status: DC

## 2016-03-28 MED ORDER — LEVOTHYROXINE SODIUM 100 MCG PO TABS: ORAL_TABLET | ORAL | 0 refills | Status: DC

## 2016-03-28 MED ORDER — LITHIUM CARBONATE ER 300 MG PO TBCR: 300.0000 mg | EXTENDED_RELEASE_TABLET | Freq: Two times a day (BID) | ORAL | 0 refills | Status: DC

## 2016-03-28 NOTE — Telephone Encounter (Signed)
Note reviewed, medications refilled

## 2016-03-28 NOTE — Telephone Encounter (Signed)
Please advise on refill for lithium and mirtazapine..the patient has an upcoming appt next month

## 2016-03-31 ENCOUNTER — Other Ambulatory Visit: Payer: Self-pay | Admitting: Internal Medicine

## 2016-03-31 DIAGNOSIS — Z1231 Encounter for screening mammogram for malignant neoplasm of breast: Secondary | ICD-10-CM

## 2016-05-01 DIAGNOSIS — C44712 Basal cell carcinoma of skin of right lower limb, including hip: Secondary | ICD-10-CM | POA: Diagnosis not present

## 2016-05-05 ENCOUNTER — Other Ambulatory Visit (HOSPITAL_COMMUNITY)
Admission: RE | Admit: 2016-05-05 | Discharge: 2016-05-05 | Disposition: A | Discharge status: Home / Self Care | Payer: Medicare HMO | Source: Ambulatory Visit | Attending: Family Medicine | Admitting: Family Medicine

## 2016-05-05 ENCOUNTER — Encounter: Payer: Self-pay | Admitting: Internal Medicine

## 2016-05-05 ENCOUNTER — Ambulatory Visit (INDEPENDENT_AMBULATORY_CARE_PROVIDER_SITE_OTHER): Payer: Medicare HMO | Admitting: Internal Medicine

## 2016-05-05 VITALS — BP 120/76 | HR 62 | Temp 98.2°F | Ht 66.0 in | Wt 172.8 lb

## 2016-05-05 DIAGNOSIS — E890 Postprocedural hypothyroidism: Secondary | ICD-10-CM | POA: Diagnosis not present

## 2016-05-05 DIAGNOSIS — R8290 Unspecified abnormal findings in urine: Secondary | ICD-10-CM | POA: Diagnosis not present

## 2016-05-05 DIAGNOSIS — F319 Bipolar disorder, unspecified: Secondary | ICD-10-CM | POA: Diagnosis not present

## 2016-05-05 DIAGNOSIS — E7800 Pure hypercholesterolemia, unspecified: Secondary | ICD-10-CM | POA: Diagnosis not present

## 2016-05-05 DIAGNOSIS — M8589 Other specified disorders of bone density and structure, multiple sites: Secondary | ICD-10-CM | POA: Diagnosis not present

## 2016-05-05 DIAGNOSIS — Z0000 Encounter for general adult medical examination without abnormal findings: Secondary | ICD-10-CM | POA: Diagnosis not present

## 2016-05-05 DIAGNOSIS — Z8041 Family history of malignant neoplasm of ovary: Secondary | ICD-10-CM | POA: Diagnosis not present

## 2016-05-05 DIAGNOSIS — Z124 Encounter for screening for malignant neoplasm of cervix: Secondary | ICD-10-CM | POA: Diagnosis not present

## 2016-05-05 DIAGNOSIS — N3000 Acute cystitis without hematuria: Secondary | ICD-10-CM

## 2016-05-05 DIAGNOSIS — F3110 Bipolar disorder, current episode manic without psychotic features, unspecified: Secondary | ICD-10-CM

## 2016-05-05 DIAGNOSIS — Z1159 Encounter for screening for other viral diseases: Secondary | ICD-10-CM

## 2016-05-05 DIAGNOSIS — Z1273 Encounter for screening for malignant neoplasm of ovary: Secondary | ICD-10-CM | POA: Diagnosis not present

## 2016-05-05 LAB — POC URINALSYSI DIPSTICK (AUTOMATED)
Bilirubin, UA: NEGATIVE
Glucose, UA: NEGATIVE
Ketones, UA: NEGATIVE
Protein, UA: NEGATIVE
Spec Grav, UA: 1.02
Urobilinogen, UA: NEGATIVE
pH, UA: 6

## 2016-05-05 LAB — COMPREHENSIVE METABOLIC PANEL
ALT: 14 U/L (ref 0–35)
AST: 15 U/L (ref 0–37)
Albumin: 4.2 g/dL (ref 3.5–5.2)
Alkaline Phosphatase: 74 U/L (ref 39–117)
BUN: 17 mg/dL (ref 6–23)
CO2: 31 mEq/L (ref 19–32)
Calcium: 9.6 mg/dL (ref 8.4–10.5)
Chloride: 104 mEq/L (ref 96–112)
Creatinine, Ser: 0.96 mg/dL (ref 0.40–1.20)
GFR: 61.2 mL/min (ref >60.00)
Glucose, Bld: 101 mg/dL — ABNORMAL HIGH (ref 70–99)
Potassium: 4.4 mEq/L (ref 3.5–5.1)
Sodium: 140 mEq/L (ref 135–145)
Total Bilirubin: 0.7 mg/dL (ref 0.2–1.2)
Total Protein: 7.2 g/dL (ref 6.0–8.3)

## 2016-05-05 LAB — CBC
HCT: 42.6 % (ref 36.0–46.0)
Hemoglobin: 14.1 g/dL (ref 12.0–15.0)
MCHC: 33.1 g/dL (ref 30.0–36.0)
MCV: 89.9 fl (ref 78.0–100.0)
Platelets: 236 10*3/uL (ref 150.0–400.0)
RBC: 4.74 Mil/uL (ref 3.87–5.11)
RDW: 14.1 % (ref 11.5–15.5)
WBC: 6.3 10*3/uL (ref 4.0–10.5)

## 2016-05-05 LAB — VITAMIN D 25 HYDROXY (VIT D DEFICIENCY, FRACTURES): VITD: 43.09 ng/mL (ref 30.00–100.00)

## 2016-05-05 LAB — T4, FREE: Free T4: 0.99 ng/dL (ref 0.60–1.60)

## 2016-05-05 LAB — LIPID PANEL
Cholesterol: 253 mg/dL — ABNORMAL HIGH (ref 0–200)
HDL: 50.7 mg/dL (ref >39.00)
NonHDL: 202.39
Total CHOL/HDL Ratio: 5
Triglycerides: 246 mg/dL — ABNORMAL HIGH (ref 0.0–149.0)
VLDL: 49.2 mg/dL — ABNORMAL HIGH (ref 0.0–40.0)

## 2016-05-05 LAB — LDL CHOLESTEROL, DIRECT: Direct LDL: 150 mg/dL

## 2016-05-05 LAB — TSH: TSH: 5.56 u[IU]/mL — ABNORMAL HIGH (ref 0.35–4.50)

## 2016-05-05 MED ORDER — CIPROFLOXACIN HCL 500 MG PO TABS: 500.0000 mg | ORAL_TABLET | Freq: Two times a day (BID) | ORAL | 0 refills | Status: DC

## 2016-05-05 NOTE — Assessment & Plan Note (Signed)
TSH and T4 today Will adjust Synthroid based on labs She needs a refill, will wait until labs are back, the refill x 1 year

## 2016-05-05 NOTE — Patient Instructions (Signed)

## 2016-05-05 NOTE — Assessment & Plan Note (Addendum)
CMET and Lipid profile today Encouraged her to consume a low fat diet 

## 2016-05-05 NOTE — Assessment & Plan Note (Signed)
Vit D level checked today Encouraged her to get 30 minutes of weight bearing activity daily

## 2016-05-05 NOTE — Addendum Note (Signed)
Addended by: Lurlean Nanny on: 05/05/2016 11:23 AM   Modules accepted: Orders

## 2016-05-05 NOTE — Assessment & Plan Note (Addendum)
Controlled on Lithium, Remeron CMET today Medication refilled x 1 year

## 2016-05-05 NOTE — Progress Notes (Signed)
HPI:  Pt presents to the clinic today for her Medicare Wellness Exam. She is requesting refills of all of her medications today.  Past Medical History:  Diagnosis Date  . Bipolar disorder ([place])   . Depression   . Hypercholesteremia   . Osteopenia   . Thyroid goiter     Current Outpatient Prescriptions  Medication Sig Dispense Refill  . cholecalciferol (VITAMIN D) 1000 UNITS tablet Take 1,000 Units by mouth daily.    Marland Kitchen levothyroxine (SYNTHROID, LEVOTHROID) 100 MCG tablet TAKE 1 (ONE) TABLET DAILY 90 tablet 0  . lithium carbonate (LITHOBID) 300 MG CR tablet Take 1 tablet (300 mg total) by mouth 2 (two) times daily. 180 tablet 0  . mirtazapine (REMERON) 15 MG tablet Take 1 tablet (15 mg total) by mouth at bedtime. 90 tablet 0  . Multiple Vitamin (MULTIVITAMIN) tablet Take 1 tablet by mouth daily.    . Omega-3 Fatty Acids (FISH OIL) 1200 MG CAPS Take 1,200 mg by mouth 2 (two) times daily.      No current facility-administered medications for this visit.     Allergies  Allergen Reactions  . Fluzone [Flu Virus Vaccine] Hives    High dose only---hives, itchy    Family History  Problem Relation Age of Onset  . Ovarian cancer Mother   . Hypertension Mother   . Heart attack Father   . Hypertension Brother   . Breast cancer Maternal Aunt   . Lung cancer Paternal Aunt   . Breast cancer Paternal Aunt   . Hypertension Other   . Breast cancer Cousin     Social History   Social History  . Marital status: Married    Spouse name: Lewis   . Number of children: 3  . Years of education: College   Occupational History  . Part-time    Social History Main Topics  . Smoking status: Never Smoker  . Smokeless tobacco: Never Used  . Alcohol use No  . Drug use: No  . Sexual activity: No   Other Topics Concern  . Not on file   Social History Narrative  . No narrative on file    Hospitiliaztions: None  Health Maintenance:    Flu: 12/2015  Tetanus: 01/2005  Pneumovax:  04/2012  Prevnar: 12/2012  Zostavax: 03/2007  Mammogram: 05/2015, scheduled 05/2016  Pap Smear: > 5 years ago  Bone Density: 05/2013  Colon Screening: 03/2009  Eye Doctor: as needed  Dental Exam: biannually   Providers:   PCP: Webb Silversmith, NP-C  Dermatologist: Dr. Kellie Moor    I have personally reviewed and have noted:  1. The patient's medical and social history 2. Their use of alcohol, tobacco or illicit drugs 3. Their current medications and supplements 4. The patient's functional ability including ADL's, fall risks, home safety risks and hearing or visual impairment. 5. Diet and physical activities 6. Evidence for depression or mood disorder  Subjective:   Review of Systems:   Constitutional: Denies fever, malaise, fatigue, headache or abrupt weight changes.  HEENT: Denies eye pain, eye redness, ear pain, ringing in the ears, wax buildup, runny nose, nasal congestion, bloody nose, or sore throat. Respiratory: Denies difficulty breathing, shortness of breath, cough or sputum production.   Cardiovascular: Denies chest pain, chest tightness, palpitations or swelling in the hands or feet.  Gastrointestinal: Denies abdominal pain, bloating, constipation, diarrhea or blood in the stool.  GU: Pt reports urine odor. Denies urgency, frequency, pain with urination, burning sensation, blood in urine, or discharge. Musculoskeletal:  Denies decrease in range of motion, difficulty with gait, muscle pain or joint pain and swelling.  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 Denies redness, rashes or ulcercations.  Neurological: Denies dizziness, difficulty with memory, difficulty with speech or problems with balance and coordination.  Psych: Pt has history of depression. Denies anxiety, SI/HI.  No other specific complaints in a complete review of systems (except as listed in HPI above).  Objective:  PE:   BP 120/76   Pulse 62   Temp 98.2 F (36.8 C) (Oral)    Ht 5\' 6"  (1.676 m)   Wt 172 lb 12 oz (78.4 kg)   SpO2 98%   BMI 27.88 kg/m   Wt Readings from Last 3 Encounters:  01/29/16 169 lb (76.7 kg)  05/02/15 162 lb (73.5 kg)  04/16/15 164 lb 6.4 oz (74.6 kg)    General: Appears her stated age, well developed, well nourished in NAD. Skin: Warm, dry and intact.  HEENT: Head: normal shape and size; Eyes: sclera white, no icterus, conjunctiva pink, PERRLA and EOMs intact; Ears: Tm's gray and intact, normal light reflex; Throat/Mouth: Teeth present, mucosa pink and moist, no exudate, lesions or ulcerations noted.  Neck: Neck supple, trachea midline. No masses, lumps or thyromegaly present.  Cardiovascular: Normal rate and rhythm. S1,S2 noted.  No murmur, rubs or gallops noted. No JVD or BLE edema. No carotid bruits noted. Pulmonary/Chest: Normal effort and positive vesicular breath sounds. No respiratory distress. No wheezes, rales or ronchi noted.  Abdomen: Soft and nontender. Normal bowel sounds. No distention or masses noted. Liver, spleen and kidneys non palpable. Pelvic: Normal female anatomy. Cervix without changes. Adnexa non palpable. Musculoskeletal: Strength 5/5 BUE/BLE. No difficulty with gait. Neurological: Alert and oriented. Cranial nerves II-XII grossly intact. Coordination normal. Resting tremor noted. Psychiatric: Mood and affect normal. Behavior is normal. Judgment and thought content normal.    BMET    Component Value Date/Time   NA 142 05/02/2015 1103   K 5.0 05/02/2015 1103   CL 100 05/02/2015 1103   CO2 26 05/02/2015 1103   GLUCOSE 102 (H) 05/02/2015 1103   BUN 21 05/02/2015 1103   CREATININE 0.90 05/02/2015 1103   CALCIUM 10.3 05/02/2015 1103   GFRNONAA 66 05/02/2015 1103   GFRAA 76 05/02/2015 1103    Lipid Panel     Component Value Date/Time   CHOL 254 (H) 05/02/2015 1103   TRIG 159 (H) 05/02/2015 1103   HDL 62 05/02/2015 1103   LDLCALC 160 (H) 05/02/2015 1103    CBC    Component Value Date/Time   WBC  7.5 05/02/2015 1103   RBC 4.95 05/02/2015 1103   HCT 44.7 05/02/2015 1103   PLT 209 05/02/2015 1103   MCV 90 05/02/2015 1103   MCH 30.1 05/02/2015 1103   MCHC 33.3 05/02/2015 1103   RDW 13.7 05/02/2015 1103   LYMPHSABS 2.5 05/02/2015 1103   EOSABS 0.1 05/02/2015 1103   BASOSABS 0.0 05/02/2015 1103    Hgb A1C No results found for: HGBA1C    Assessment and Plan:   Medicare Annual Wellness Visit:  Diet: She does eat lean meat. She consumes fruits and veggies daily. She tries to avoid fried food. She drinks mostly water. 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 Depression/mood screen: Negative, on medication Hearing: Intact to whispered voice Visual acuity: Grossly normal ADLs: Capable Fall risk: None Home safety: Good Cognitive evaluation: Intact to orientation, naming, recall and  repetition EOL planning: Adv directives, full code/ I agree  Preventative Medicine: All immunizations are UTD. Pap smear today. Mammogram scheduled. Bone density due 2020. Colon Screening due 2021. Encouraged her to consume a balanced diet and exercise regimen. Advised her to see an eye doctor and dentist annually. Will check CBC, CMET, Lipid, TSH, T4, Hep C, CA 125 and Vit D level today.  Abnormal urin odor:  Urinalysis: 2+ leuks, pos nitrites, 1+ blood Will send urine culture eRx for Cipro 500 mg BID x 5 days Push fluids   Next appointment: 1 year, Medicare Wellness Exam   Webb Silversmith, NP

## 2016-05-06 ENCOUNTER — Other Ambulatory Visit: Payer: Self-pay | Admitting: Internal Medicine

## 2016-05-06 ENCOUNTER — Telehealth: Payer: Self-pay

## 2016-05-06 DIAGNOSIS — F3170 Bipolar disorder, currently in remission, most recent episode unspecified: Secondary | ICD-10-CM

## 2016-05-06 LAB — HEPATITIS C ANTIBODY: HCV Ab: NEGATIVE

## 2016-05-06 LAB — CYTOLOGY - PAP: Diagnosis: NEGATIVE

## 2016-05-06 LAB — CA 125: CA 125: 7 U/mL (ref <35)

## 2016-05-06 MED ORDER — LEVOTHYROXINE SODIUM 112 MCG PO TABS: 112.0000 ug | ORAL_TABLET | Freq: Every day | ORAL | 1 refills | Status: DC

## 2016-05-06 MED ORDER — MIRTAZAPINE 15 MG PO TABS: 15.0000 mg | ORAL_TABLET | Freq: Every day | ORAL | 1 refills | Status: DC

## 2016-05-06 MED ORDER — MIRTAZAPINE 15 MG PO TABS
15.0000 mg | ORAL_TABLET | Freq: Every day | ORAL | 1 refills | Status: DC
Start: 2016-05-06 — End: 2016-10-23

## 2016-05-06 MED ORDER — LITHIUM CARBONATE ER 300 MG PO TBCR: 300.0000 mg | EXTENDED_RELEASE_TABLET | Freq: Two times a day (BID) | ORAL | 1 refills | Status: DC

## 2016-05-06 NOTE — Telephone Encounter (Signed)
Pt seen for annual on 05/05/16 and levothyroxine,lithium and mirtazapine were sent to [facility];pt wants meds to go to [facility] mail order. Advised pt done. [facility] refills cancelled; spoke with [place].

## 2016-05-07 LAB — URINE CULTURE

## 2016-05-13 ENCOUNTER — Telehealth: Payer: Self-pay | Admitting: Internal Medicine

## 2016-05-13 NOTE — Telephone Encounter (Signed)
Pt returned call about labs pleased call [phone] Thanks

## 2016-05-13 NOTE — Telephone Encounter (Signed)
I spoke to about her lab results on 05/08/16---also said to her that if I did not call her back about the urine culture, then she is on the appropriate antibiotics and finished current Rx

## 2016-05-20 ENCOUNTER — Telehealth: Payer: Self-pay

## 2016-05-20 DIAGNOSIS — E7800 Pure hypercholesterolemia, unspecified: Secondary | ICD-10-CM

## 2016-05-20 NOTE — Telephone Encounter (Signed)
Her LDL is 150, should be < 100. We can wait and recheck in 3 months to see if she can get it down with diet but if it is still elevated at that time, I would recommend she start medication. Let me know what she would like to do.

## 2016-05-20 NOTE — Telephone Encounter (Signed)
Pt received copy of 05/05/16 lab report; for 6 months pt has been eating 23 almonds every morning to help lower cholesterol. Pt tries to eat a low cholesterol diet. Pt wants to know if Avie Echevaria NP thinks pt should continue eating the almonds to help lower cholesterol and does R Baity NP think that she absolutely needs to start a cholesterol med. I advised pt from lab result note that Avie Echevaria NP strongly recommends that she start a cholesterol med but pt wants Rollene Fare asked again to be sure it is necessary. Pt request cb with Avie Echevaria NP determination. [facility] pharmacy.

## 2016-05-21 MED ORDER — SIMVASTATIN 10 MG PO TABS: 10.0000 mg | ORAL_TABLET | Freq: Every day | ORAL | 1 refills | Status: DC

## 2016-05-21 NOTE — Addendum Note (Signed)
Addended by: Jearld Fenton on: 05/21/2016 11:38 AM   Modules accepted: Orders

## 2016-05-21 NOTE — Telephone Encounter (Signed)
Pt is aware as instructed---lab appt scheduled

## 2016-05-21 NOTE — Telephone Encounter (Signed)
Patient called and said she'd like to start taking medication for her cholesterol.  Patient uses [facility].  Please call patient when rx is sent to pharmacy.

## 2016-05-21 NOTE — Telephone Encounter (Signed)
Zocor 10 mg sent to pharmacy. 3 month labs orders entered. She should schedule lab only appt.

## 2016-06-04 ENCOUNTER — Other Ambulatory Visit: Payer: Self-pay | Admitting: Internal Medicine

## 2016-06-11 ENCOUNTER — Telehealth: Payer: Self-pay

## 2016-06-11 NOTE — Telephone Encounter (Signed)
Advise her to start taking Mirilax daily

## 2016-06-11 NOTE — Telephone Encounter (Signed)
Pt left v/m pt started taking zocor on 05/24/16; pt is not having any leg cramping but pt is having side effect of constipation and pt wants to know what to do about constipation.Please advise. [facility].

## 2016-06-12 ENCOUNTER — Telehealth: Payer: Self-pay | Admitting: *Deleted

## 2016-06-12 NOTE — Telephone Encounter (Signed)
Spoke to pt who states since starting zocor she has been experiencing constipation. She has increased her fiber but has had no relief. She is requesting a call back discuss what she may able to take that will not interfere with her other medications.

## 2016-06-12 NOTE — Telephone Encounter (Signed)
She should start taking Mirilax daily

## 2016-06-12 NOTE — Telephone Encounter (Signed)
Pt is aware as instructed 

## 2016-06-13 ENCOUNTER — Ambulatory Visit
Admission: RE | Admit: 2016-06-13 | Discharge: 2016-06-13 | Disposition: A | Discharge status: Home / Self Care | Payer: Medicare HMO | Source: Ambulatory Visit | Attending: Internal Medicine | Admitting: Internal Medicine

## 2016-06-13 DIAGNOSIS — Z1231 Encounter for screening mammogram for malignant neoplasm of breast: Secondary | ICD-10-CM | POA: Diagnosis not present

## 2016-08-15 DIAGNOSIS — L821 Other seborrheic keratosis: Secondary | ICD-10-CM | POA: Diagnosis not present

## 2016-08-15 DIAGNOSIS — Z08 Encounter for follow-up examination after completed treatment for malignant neoplasm: Secondary | ICD-10-CM | POA: Diagnosis not present

## 2016-08-15 DIAGNOSIS — Z85828 Personal history of other malignant neoplasm of skin: Secondary | ICD-10-CM | POA: Diagnosis not present

## 2016-08-26 ENCOUNTER — Other Ambulatory Visit (INDEPENDENT_AMBULATORY_CARE_PROVIDER_SITE_OTHER): Payer: Medicare HMO

## 2016-08-26 ENCOUNTER — Telehealth: Payer: Self-pay | Admitting: Internal Medicine

## 2016-08-26 DIAGNOSIS — E890 Postprocedural hypothyroidism: Secondary | ICD-10-CM | POA: Diagnosis not present

## 2016-08-26 DIAGNOSIS — E7800 Pure hypercholesterolemia, unspecified: Secondary | ICD-10-CM

## 2016-08-26 DIAGNOSIS — E039 Hypothyroidism, unspecified: Secondary | ICD-10-CM

## 2016-08-26 LAB — COMPREHENSIVE METABOLIC PANEL
ALT: 13 U/L (ref 0–35)
AST: 15 U/L (ref 0–37)
Albumin: 4.3 g/dL (ref 3.5–5.2)
Alkaline Phosphatase: 79 U/L (ref 39–117)
BUN: 18 mg/dL (ref 6–23)
CO2: 33 mEq/L — ABNORMAL HIGH (ref 19–32)
Calcium: 9.9 mg/dL (ref 8.4–10.5)
Chloride: 105 mEq/L (ref 96–112)
Creatinine, Ser: 0.93 mg/dL (ref 0.40–1.20)
GFR: 63.43 mL/min (ref >60.00)
Glucose, Bld: 103 mg/dL — ABNORMAL HIGH (ref 70–99)
Potassium: 4.4 mEq/L (ref 3.5–5.1)
Sodium: 141 mEq/L (ref 135–145)
Total Bilirubin: 0.9 mg/dL (ref 0.2–1.2)
Total Protein: 7.3 g/dL (ref 6.0–8.3)

## 2016-08-26 LAB — LIPID PANEL
Cholesterol: 168 mg/dL (ref 0–200)
HDL: 51 mg/dL (ref >39.00)
LDL Cholesterol: 83 mg/dL (ref 0–99)
NonHDL: 116.61
Total CHOL/HDL Ratio: 3
Triglycerides: 169 mg/dL — ABNORMAL HIGH (ref 0.0–149.0)
VLDL: 33.8 mg/dL (ref 0.0–40.0)

## 2016-08-26 LAB — TSH: TSH: 0.13 u[IU]/mL — ABNORMAL LOW (ref 0.35–4.50)

## 2016-08-26 MED ORDER — LEVOTHYROXINE SODIUM 75 MCG PO TABS: 75.0000 ug | ORAL_TABLET | Freq: Every day | ORAL | 0 refills | Status: DC

## 2016-08-26 MED ORDER — SIMVASTATIN 10 MG PO TABS: 10.0000 mg | ORAL_TABLET | Freq: Every day | ORAL | 1 refills | Status: DC

## 2016-08-26 NOTE — Telephone Encounter (Signed)
[place] Self [phone]  Mackensi come into get her labs and she dropped off this paper that has the information on it to fax  her Biometric Screening to [facility]. Fax# [phone]

## 2016-08-26 NOTE — Addendum Note (Signed)
Addended by: Lurlean Nanny on: 08/26/2016 05:32 PM   Modules accepted: Orders

## 2016-08-27 NOTE — Telephone Encounter (Signed)
Pt left v/m requesting cb about bio metric screening form.

## 2016-08-27 NOTE — Telephone Encounter (Signed)
I spoke to pt and she states [employer] will mail her form needed to be filled out and she will bring into office once she receives it

## 2016-09-09 ENCOUNTER — Telehealth: Payer: Self-pay | Admitting: Internal Medicine

## 2016-09-09 NOTE — Telephone Encounter (Signed)
Pt dropped off biometric form to be completed by [facility]. Form left in rx tower to be picked up by [facility].

## 2016-09-10 DIAGNOSIS — Z7689 Persons encountering health services in other specified circumstances: Secondary | ICD-10-CM

## 2016-09-10 NOTE — Telephone Encounter (Signed)
Left detailed msg on VM per HIPAA requesting patient give me her waist size as the form requires it

## 2016-09-15 NOTE — Telephone Encounter (Signed)
33"... Form faxed original mailed to pt, copy sent to be scanned... Pt is aware

## 2016-10-01 ENCOUNTER — Other Ambulatory Visit (INDEPENDENT_AMBULATORY_CARE_PROVIDER_SITE_OTHER): Payer: Medicare HMO

## 2016-10-01 DIAGNOSIS — E039 Hypothyroidism, unspecified: Secondary | ICD-10-CM

## 2016-10-01 LAB — TSH: TSH: 6.88 u[IU]/mL — ABNORMAL HIGH (ref 0.35–4.50)

## 2016-10-03 ENCOUNTER — Other Ambulatory Visit: Payer: Self-pay | Admitting: Internal Medicine

## 2016-10-03 DIAGNOSIS — E039 Hypothyroidism, unspecified: Secondary | ICD-10-CM

## 2016-10-17 IMAGING — CR DG CHEST 2V
1 series · 2 of 2 positions shown · non-contrast
Comparison: None.

CLINICAL DATA: Cough and shortness of breath

EXAM:
CHEST  2 VIEW

[Series 1: kdxr chest pa (or ap) and lat · 0.14mm/px · 2 of 2 slices shown]
[im 1/2]
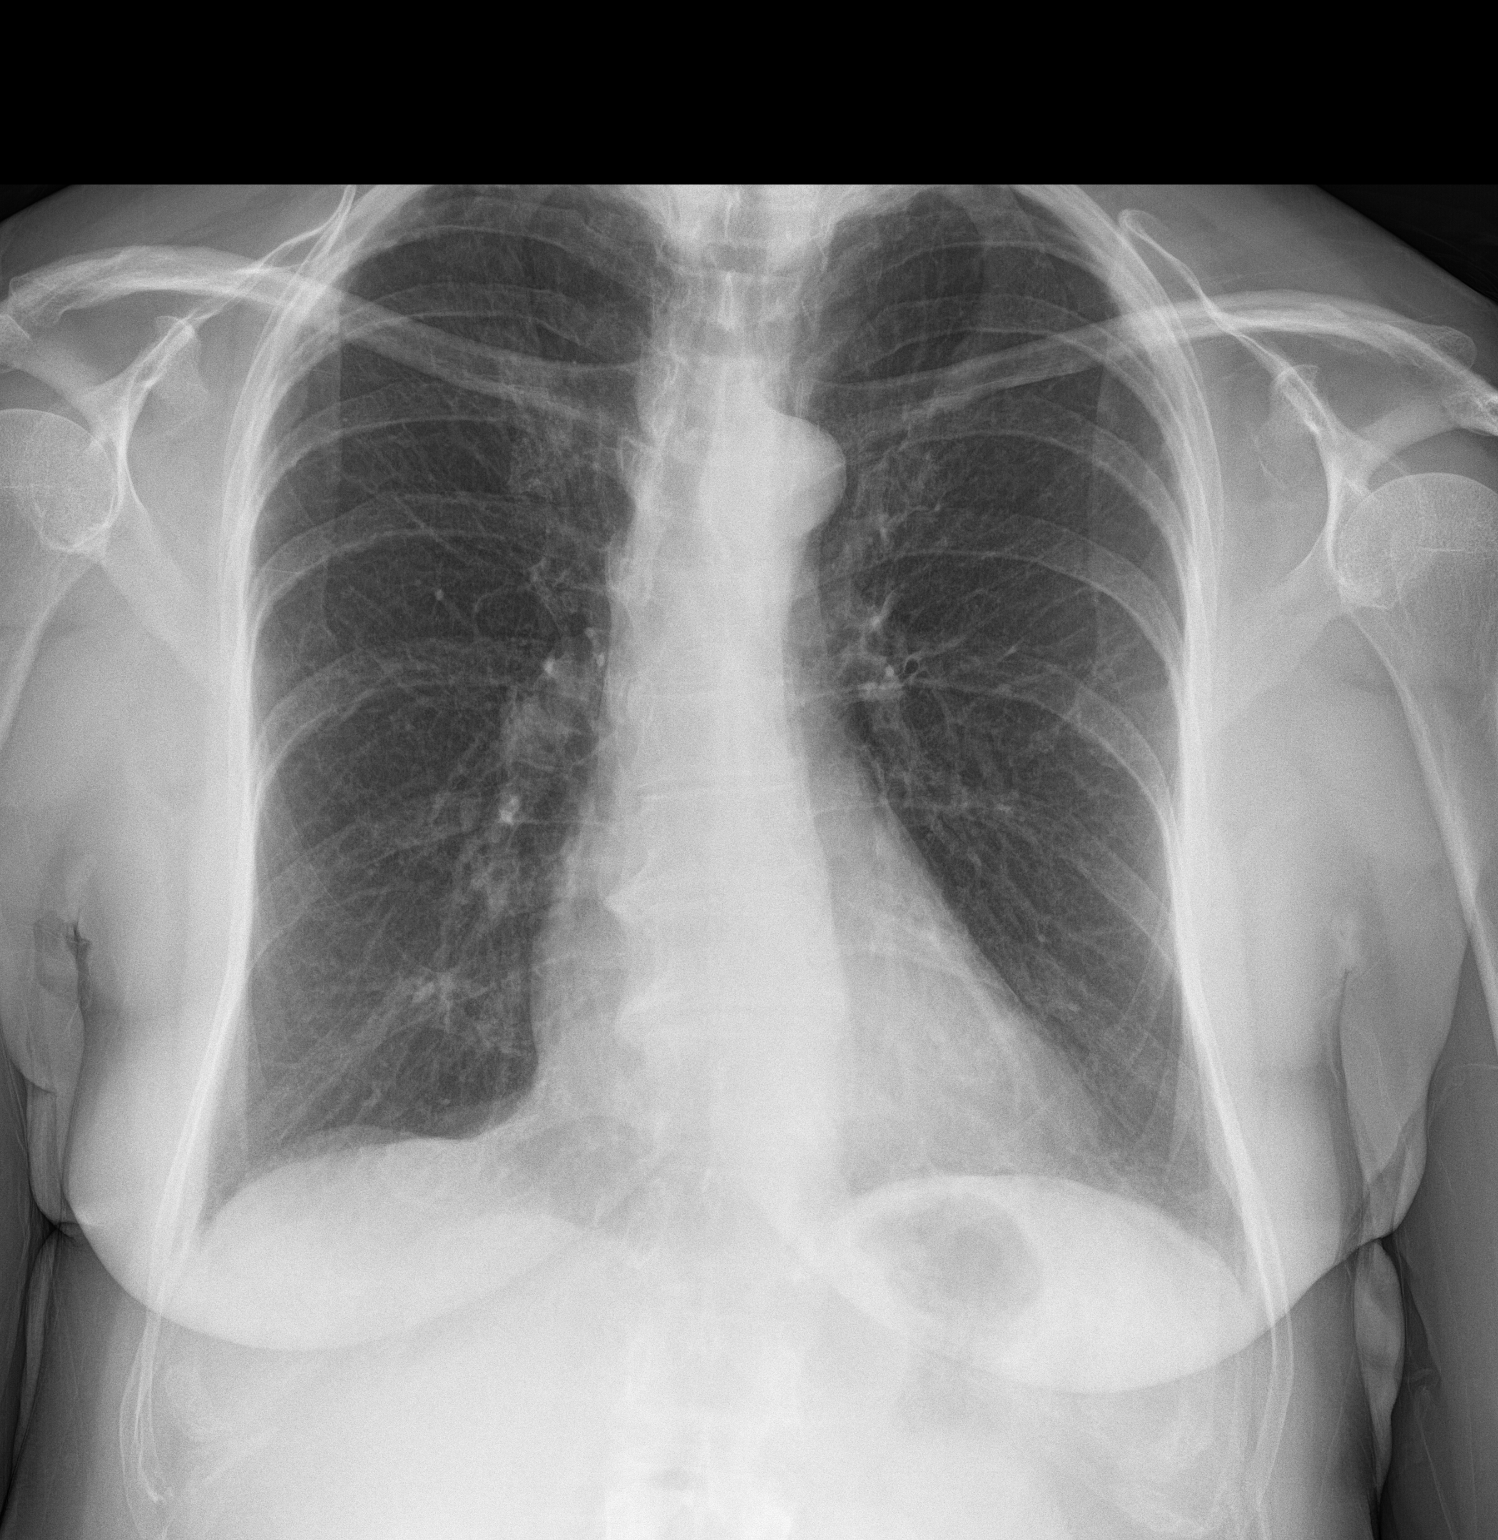
[im 2/2]
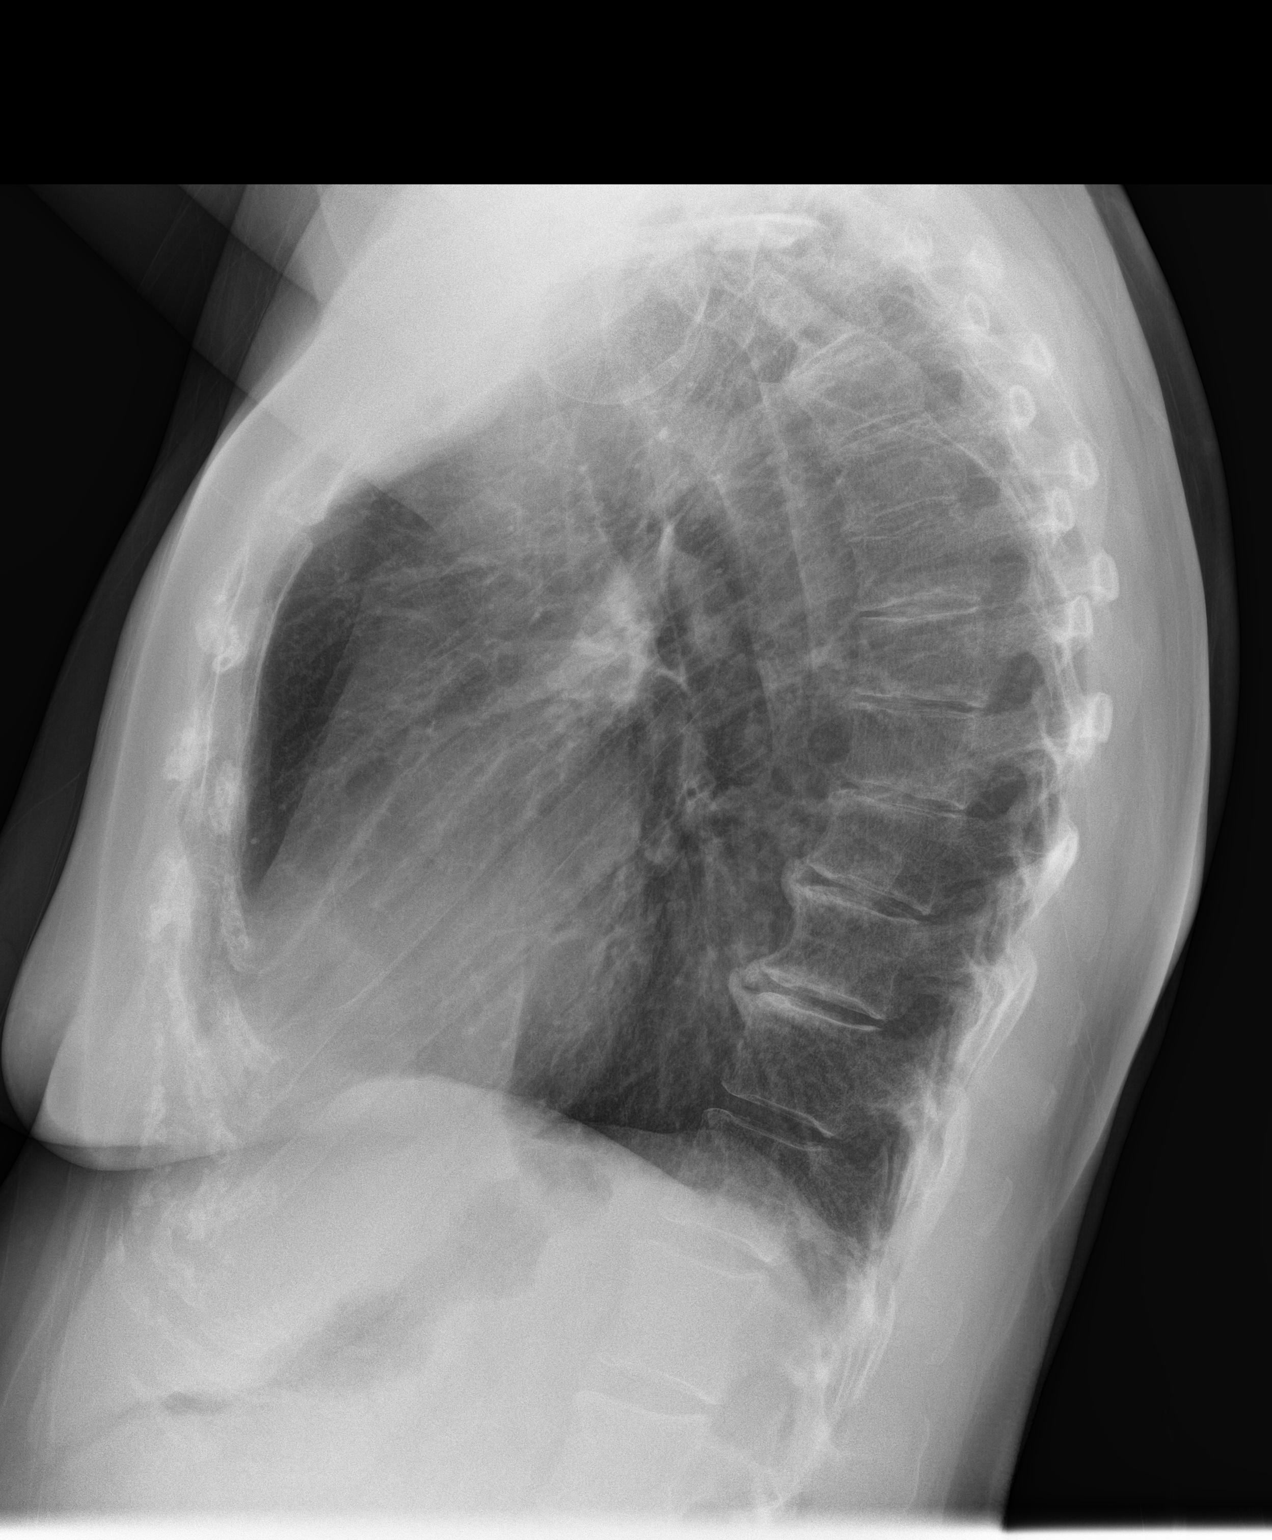

[2 of 2 positions shown; findings below may reference images not displayed]

FINDINGS: The lungs are hyperinflated. There is peribronchial thickening. No
infiltrates or effusions. Slight irregular apical pleural
thickening. Heart size and pulmonary vascularity are normal. No
acute osseous abnormality.
IMPRESSION: Bronchitic changes with emphysema.

## 2016-10-23 ENCOUNTER — Other Ambulatory Visit: Payer: Self-pay | Admitting: Internal Medicine

## 2016-10-23 DIAGNOSIS — F3170 Bipolar disorder, currently in remission, most recent episode unspecified: Secondary | ICD-10-CM

## 2016-10-28 ENCOUNTER — Telehealth: Payer: Self-pay

## 2016-10-28 MED ORDER — LEVOTHYROXINE SODIUM 100 MCG PO TABS: 100.0000 ug | ORAL_TABLET | Freq: Every day | ORAL | 0 refills | Status: DC

## 2016-10-28 NOTE — Telephone Encounter (Signed)
100 mcg is fine for now, ok to send to [facility]. I can not get her a sooner appt. She can call and see if there have been any cancellations.

## 2016-10-28 NOTE — Addendum Note (Signed)
Addended by: Lurlean Nanny on: 10/28/2016 05:13 PM   Modules accepted: Orders

## 2016-10-28 NOTE — Telephone Encounter (Signed)
Pt got appt at [facility] on 01/07/17; pt thinks she needs to be seen sooner and request our office to do referral to get sooner appt; pt also request refill of thyroid med sent to [facility]. Please seen 10/01/16 lab result note; ? Dose of thyroid med. Pt decided herself to take levothyroxine 100 mcg because 75 mcg and 112 mcg did not work. Pt request cb.

## 2016-10-29 NOTE — Telephone Encounter (Signed)
Pt is aware as instructed and expressed understanding 

## 2016-12-28 DIAGNOSIS — Z23 Encounter for immunization: Secondary | ICD-10-CM | POA: Diagnosis not present

## 2017-02-02 ENCOUNTER — Other Ambulatory Visit: Payer: Self-pay | Admitting: Internal Medicine

## 2017-02-02 DIAGNOSIS — E7800 Pure hypercholesterolemia, unspecified: Secondary | ICD-10-CM

## 2017-03-03 DIAGNOSIS — R5383 Other fatigue: Secondary | ICD-10-CM | POA: Diagnosis not present

## 2017-03-03 DIAGNOSIS — E039 Hypothyroidism, unspecified: Secondary | ICD-10-CM | POA: Diagnosis not present

## 2017-03-06 ENCOUNTER — Other Ambulatory Visit: Payer: Self-pay | Admitting: Internal Medicine

## 2017-03-06 DIAGNOSIS — Z1231 Encounter for screening mammogram for malignant neoplasm of breast: Secondary | ICD-10-CM

## 2017-03-17 ENCOUNTER — Other Ambulatory Visit: Payer: Self-pay | Admitting: Internal Medicine

## 2017-03-17 DIAGNOSIS — F3170 Bipolar disorder, currently in remission, most recent episode unspecified: Secondary | ICD-10-CM

## 2017-03-18 NOTE — Telephone Encounter (Signed)
Last filled 10/2016 90 days with 1 refill.. Pt has appt scheduled for 04/2017... Please advise

## 2017-04-06 ENCOUNTER — Other Ambulatory Visit: Payer: Self-pay | Admitting: Internal Medicine

## 2017-04-06 DIAGNOSIS — E7800 Pure hypercholesterolemia, unspecified: Secondary | ICD-10-CM

## 2017-05-07 ENCOUNTER — Ambulatory Visit (INDEPENDENT_AMBULATORY_CARE_PROVIDER_SITE_OTHER): Payer: Medicare HMO | Admitting: Internal Medicine

## 2017-05-07 ENCOUNTER — Encounter: Payer: Self-pay | Admitting: Internal Medicine

## 2017-05-07 VITALS — BP 120/80 | HR 60 | Temp 97.9°F | Ht 65.75 in | Wt 167.0 lb

## 2017-05-07 DIAGNOSIS — Z0000 Encounter for general adult medical examination without abnormal findings: Secondary | ICD-10-CM

## 2017-05-07 DIAGNOSIS — E7800 Pure hypercholesterolemia, unspecified: Secondary | ICD-10-CM | POA: Diagnosis not present

## 2017-05-07 DIAGNOSIS — F319 Bipolar disorder, unspecified: Secondary | ICD-10-CM

## 2017-05-07 DIAGNOSIS — E039 Hypothyroidism, unspecified: Secondary | ICD-10-CM

## 2017-05-07 DIAGNOSIS — Z8041 Family history of malignant neoplasm of ovary: Secondary | ICD-10-CM | POA: Diagnosis not present

## 2017-05-07 DIAGNOSIS — R350 Frequency of micturition: Secondary | ICD-10-CM | POA: Diagnosis not present

## 2017-05-07 DIAGNOSIS — F3110 Bipolar disorder, current episode manic without psychotic features, unspecified: Secondary | ICD-10-CM

## 2017-05-07 DIAGNOSIS — Z1502 Genetic susceptibility to malignant neoplasm of ovary: Secondary | ICD-10-CM | POA: Diagnosis not present

## 2017-05-07 LAB — CBC
HCT: 44.1 % (ref 36.0–46.0)
Hemoglobin: 14.6 g/dL (ref 12.0–15.0)
MCHC: 33.1 g/dL (ref 30.0–36.0)
MCV: 90.2 fl (ref 78.0–100.0)
Platelets: 202 10*3/uL (ref 150.0–400.0)
RBC: 4.89 Mil/uL (ref 3.87–5.11)
RDW: 13.5 % (ref 11.5–15.5)
WBC: 6.5 10*3/uL (ref 4.0–10.5)

## 2017-05-07 LAB — COMPREHENSIVE METABOLIC PANEL
ALT: 15 U/L (ref 0–35)
AST: 16 U/L (ref 0–37)
Albumin: 4.3 g/dL (ref 3.5–5.2)
Alkaline Phosphatase: 80 U/L (ref 39–117)
BUN: 16 mg/dL (ref 6–23)
CO2: 30 mEq/L (ref 19–32)
Calcium: 9.9 mg/dL (ref 8.4–10.5)
Chloride: 102 mEq/L (ref 96–112)
Creatinine, Ser: 0.98 mg/dL (ref 0.40–1.20)
GFR: 59.59 mL/min — ABNORMAL LOW (ref >60.00)
Glucose, Bld: 95 mg/dL (ref 70–99)
Potassium: 4.5 mEq/L (ref 3.5–5.1)
Sodium: 139 mEq/L (ref 135–145)
Total Bilirubin: 0.9 mg/dL (ref 0.2–1.2)
Total Protein: 7.5 g/dL (ref 6.0–8.3)

## 2017-05-07 LAB — LIPID PANEL
Cholesterol: 167 mg/dL (ref 0–200)
HDL: 57.4 mg/dL (ref >39.00)
LDL Cholesterol: 79 mg/dL (ref 0–99)
NonHDL: 109.8
Total CHOL/HDL Ratio: 3
Triglycerides: 154 mg/dL — ABNORMAL HIGH (ref 0.0–149.0)
VLDL: 30.8 mg/dL (ref 0.0–40.0)

## 2017-05-07 LAB — POC URINALSYSI DIPSTICK (AUTOMATED)
Bilirubin, UA: NEGATIVE
Glucose, UA: NEGATIVE
Ketones, UA: NEGATIVE
Nitrite, UA: NEGATIVE
Protein, UA: NEGATIVE
Spec Grav, UA: 1.02 (ref 1.010–1.025)
Urobilinogen, UA: 0.2 E.U./dL
pH, UA: 6 (ref 5.0–8.0)

## 2017-05-07 LAB — T4, FREE: Free T4: 0.92 ng/dL (ref 0.60–1.60)

## 2017-05-07 LAB — VITAMIN D 25 HYDROXY (VIT D DEFICIENCY, FRACTURES): VITD: 57.81 ng/mL (ref 30.00–100.00)

## 2017-05-07 LAB — TSH: TSH: 1.17 u[IU]/mL (ref 0.35–4.50)

## 2017-05-07 NOTE — Assessment & Plan Note (Signed)
CMET and Lipid profile today Encouraged her to consume a low fat diet Continue Simvastatin, will adjust if needed based on labs 

## 2017-05-07 NOTE — Patient Instructions (Signed)
Health Maintenance for Postmenopausal Women Menopause is a normal process in which your reproductive ability comes to an end. This process happens gradually over a span of months to years, usually between the ages of 22 and 9. Menopause is complete when you have missed 12 consecutive menstrual periods. It is important to talk with your health care provider about some of the most common conditions that affect postmenopausal women, such as heart disease, cancer, and bone loss (osteoporosis). Adopting a healthy lifestyle and getting preventive care can help to promote your health and wellness. Those actions can also lower your chances of developing some of these common conditions. What should I know about menopause? During menopause, you may experience a number of symptoms, such as:  Moderate-to-severe hot flashes.  Night sweats.  Decrease in sex drive.  Mood swings.  Headaches.  Tiredness.  Irritability.  Memory problems.  Insomnia.  Choosing to treat or not to treat menopausal changes is an individual decision that you make with your health care provider. What should I know about hormone replacement therapy and supplements? Hormone therapy products are effective for treating symptoms that are associated with menopause, such as hot flashes and night sweats. Hormone replacement carries certain risks, especially as you become older. If you are thinking about using estrogen or estrogen with progestin treatments, discuss the benefits and risks with your health care provider. What should I know about heart disease and stroke? Heart disease, heart attack, and stroke become more likely as you age. This may be due, in part, to the hormonal changes that your body experiences during menopause. These can affect how your body processes dietary fats, triglycerides, and cholesterol. Heart attack and stroke are both medical emergencies. There are many things that you can do to help prevent heart disease  and stroke:  Have your blood pressure checked at least every 1-2 years. High blood pressure causes heart disease and increases the risk of stroke.  If you are 53-22 years old, ask your health care provider if you should take aspirin to prevent a heart attack or a stroke.  Do not use any tobacco products, including cigarettes, chewing tobacco, or electronic cigarettes. If you need help quitting, ask your health care provider.  It is important to eat a healthy diet and maintain a healthy weight. ? Be sure to include plenty of vegetables, fruits, low-fat dairy products, and lean protein. ? Avoid eating foods that are high in solid fats, added sugars, or salt (sodium).  Get regular exercise. This is one of the most important things that you can do for your health. ? Try to exercise for at least 150 minutes each week. The type of exercise that you do should increase your heart rate and make you sweat. This is known as moderate-intensity exercise. ? Try to do strengthening exercises at least twice each week. Do these in addition to the moderate-intensity exercise.  Know your numbers.Ask your health care provider to check your cholesterol and your blood glucose. Continue to have your blood tested as directed by your health care provider.  What should I know about cancer screening? There are several types of cancer. Take the following steps to reduce your risk and to catch any cancer development as early as possible. Breast Cancer  Practice breast self-awareness. ? This means understanding how your breasts normally appear and feel. ? It also means doing regular breast self-exams. Let your health care provider know about any changes, no matter how small.  If you are 40  or older, have a clinician do a breast exam (clinical breast exam or CBE) every year. Depending on your age, family history, and medical history, it may be recommended that you also have a yearly breast X-ray (mammogram).  If you  have a family history of breast cancer, talk with your health care provider about genetic screening.  If you are at high risk for breast cancer, talk with your health care provider about having an MRI and a mammogram every year.  Breast cancer (BRCA) gene test is recommended for women who have family members with BRCA-related cancers. Results of the assessment will determine the need for genetic counseling and BRCA1 and for BRCA2 testing. BRCA-related cancers include these types: ? Breast. This occurs in males or females. ? Ovarian. ? Tubal. This may also be called fallopian tube cancer. ? Cancer of the abdominal or pelvic lining (peritoneal cancer). ? Prostate. ? Pancreatic.  Cervical, Uterine, and Ovarian Cancer Your health care provider may recommend that you be screened regularly for cancer of the pelvic organs. These include your ovaries, uterus, and vagina. This screening involves a pelvic exam, which includes checking for microscopic changes to the surface of your cervix (Pap test).  For women ages 21-65, health care providers may recommend a pelvic exam and a Pap test every three years. For women ages 79-65, they may recommend the Pap test and pelvic exam, combined with testing for human papilloma virus (HPV), every five years. Some types of HPV increase your risk of cervical cancer. Testing for HPV may also be done on women of any age who have unclear Pap test results.  Other health care providers may not recommend any screening for nonpregnant women who are considered low risk for pelvic cancer and have no symptoms. Ask your health care provider if a screening pelvic exam is right for you.  If you have had past treatment for cervical cancer or a condition that could lead to cancer, you need Pap tests and screening for cancer for at least 20 years after your treatment. If Pap tests have been discontinued for you, your risk factors (such as having a new sexual partner) need to be  reassessed to determine if you should start having screenings again. Some women have medical problems that increase the chance of getting cervical cancer. In these cases, your health care provider may recommend that you have screening and Pap tests more often.  If you have a family history of uterine cancer or ovarian cancer, talk with your health care provider about genetic screening.  If you have vaginal bleeding after reaching menopause, tell your health care provider.  There are currently no reliable tests available to screen for ovarian cancer.  Lung Cancer Lung cancer screening is recommended for adults 69-62 years old who are at high risk for lung cancer because of a history of smoking. A yearly low-dose CT scan of the lungs is recommended if you:  Currently smoke.  Have a history of at least 30 pack-years of smoking and you currently smoke or have quit within the past 15 years. A pack-year is smoking an average of one pack of cigarettes per day for one year.  Yearly screening should:  Continue until it has been 15 years since you quit.  Stop if you develop a health problem that would prevent you from having lung cancer treatment.  Colorectal Cancer  This type of cancer can be detected and can often be prevented.  Routine colorectal cancer screening usually begins at  age 42 and continues through age 45.  If you have risk factors for colon cancer, your health care provider may recommend that you be screened at an earlier age.  If you have a family history of colorectal cancer, talk with your health care provider about genetic screening.  Your health care provider may also recommend using home test kits to check for hidden blood in your stool.  A small camera at the end of a tube can be used to examine your colon directly (sigmoidoscopy or colonoscopy). This is done to check for the earliest forms of colorectal cancer.  Direct examination of the colon should be repeated every  5-10 years until age 71. However, if early forms of precancerous polyps or small growths are found or if you have a family history or genetic risk for colorectal cancer, you may need to be screened more often.  Skin Cancer  Check your skin from head to toe regularly.  Monitor any moles. Be sure to tell your health care provider: ? About any new moles or changes in moles, especially if there is a change in a mole's shape or color. ? If you have a mole that is larger than the size of a pencil eraser.  If any of your family members has a history of skin cancer, especially at a young age, talk with your health care provider about genetic screening.  Always use sunscreen. Apply sunscreen liberally and repeatedly throughout the day.  Whenever you are outside, protect yourself by wearing long sleeves, pants, a wide-brimmed hat, and sunglasses.  What should I know about osteoporosis? Osteoporosis is a condition in which bone destruction happens more quickly than new bone creation. After menopause, you may be at an increased risk for osteoporosis. To help prevent osteoporosis or the bone fractures that can happen because of osteoporosis, the following is recommended:  If you are 46-71 years old, get at least 1,000 mg of calcium and at least 600 mg of vitamin D per day.  If you are older than age 55 but younger than age 65, get at least 1,200 mg of calcium and at least 600 mg of vitamin D per day.  If you are older than age 54, get at least 1,200 mg of calcium and at least 800 mg of vitamin D per day.  Smoking and excessive alcohol intake increase the risk of osteoporosis. Eat foods that are rich in calcium and vitamin D, and do weight-bearing exercises several times each week as directed by your health care provider. What should I know about how menopause affects my mental health? Depression may occur at any age, but it is more common as you become older. Common symptoms of depression  include:  Low or sad mood.  Changes in sleep patterns.  Changes in appetite or eating patterns.  Feeling an overall lack of motivation or enjoyment of activities that you previously enjoyed.  Frequent crying spells.  Talk with your health care provider if you think that you are experiencing depression. What should I know about immunizations? It is important that you get and maintain your immunizations. These include:  Tetanus, diphtheria, and pertussis (Tdap) booster vaccine.  Influenza every year before the flu season begins.  Pneumonia vaccine.  Shingles vaccine.  Your health care provider may also recommend other immunizations. This information is not intended to replace advice given to you by your health care provider. Make sure you discuss any questions you have with your health care provider. Document Released: 04/25/2005  Document Revised: 09/21/2015 Document Reviewed: 12/05/2014 Elsevier Interactive Patient Education  2018 Elsevier Inc.  

## 2017-05-07 NOTE — Addendum Note (Signed)
Addended by: Lurlean Nanny on: 05/07/2017 11:37 AM   Modules accepted: Orders

## 2017-05-07 NOTE — Assessment & Plan Note (Signed)
TSH and Free T4 today Will adjust Synthroid if needed based on labs 

## 2017-05-07 NOTE — Progress Notes (Addendum)
HPI:  Pt presents to the clinic today for her Medicare Wellness Exam. She is also due to follow up chronic conditions.  Bipolar Depression: Chronic but stable on Lithium and Remeron. She is having some urinary frequency. She no longer follows with psychiatry.  HLD: Her last LDL was 83, 08/2016. She is taking Simvastatin as prescribed. She denies myalgias. She tries to consume a low fat diet.  Hypothyroidism: She denies any issues on her current dose of Synthroid. She is due to have her levels repeated today.  Past Medical History:  Diagnosis Date  . Basal cell carcinoma   . Bipolar disorder ([place])   . Depression   . Hypercholesteremia   . Osteopenia   . Thyroid goiter     Current Outpatient Medications  Medication Sig Dispense Refill  . cholecalciferol (VITAMIN D) 1000 UNITS tablet Take 1,000 Units by mouth daily.    . ciprofloxacin (CIPRO) 500 MG tablet Take 1 tablet (500 mg total) by mouth 2 (two) times daily. 10 tablet 0  . levothyroxine (SYNTHROID, LEVOTHROID) 100 MCG tablet Take 1 tablet (100 mcg total) by mouth daily. 90 tablet 0  . lithium carbonate (LITHOBID) 300 MG CR tablet TAKE 1 TABLET TWICE DAILY 180 tablet 0  . mirtazapine (REMERON) 15 MG tablet TAKE 1 TABLET AT BEDTIME 90 tablet 0  . Multiple Vitamin (MULTIVITAMIN) tablet Take 1 tablet by mouth daily.    . Omega-3 Fatty Acids (FISH OIL) 1200 MG CAPS Take 1,200 mg by mouth 2 (two) times daily.     . simvastatin (ZOCOR) 10 MG tablet TAKE 1 TABLET AT BEDTIME 90 tablet 0   No current facility-administered medications for this visit.     Allergies  Allergen Reactions  . Fluzone [Flu Virus Vaccine] Hives    High dose only---hives, itchy    Family History  Problem Relation Age of Onset  . Ovarian cancer Mother   . Hypertension Mother   . Heart attack Father   . Hypertension Brother   . Breast cancer Maternal Aunt   . Lung cancer Paternal Aunt   . Breast cancer Paternal Aunt   . Hypertension Other   . Breast  cancer Cousin     Social History   Socioeconomic History  . Marital status: Married    Spouse name: Lewis   . Number of children: 3  . Years of education: College  . Highest education level: Not on file  Social Needs  . Financial resource strain: Not on file  . Food insecurity - worry: Not on file  . Food insecurity - inability: Not on file  . Transportation needs - medical: Not on file  . Transportation needs - non-medical: Not on file  Occupational History  . Occupation: Part-time  Tobacco Use  . Smoking status: Never Smoker  . Smokeless tobacco: Never Used  Substance and Sexual Activity  . Alcohol use: No    Alcohol/week: 0.0 oz  . Drug use: No  . Sexual activity: No  Other Topics Concern  . Not on file  Social History Narrative  . Not on file    Hospitiliaztions: None  Health Maintenance:    Flu: 12/2016  Tetanus: 01/2005  Pneumovax: 04/2012  Prevnar: 05/2013  Zostavax: 03/2007  Mammogram: 05/2016, scheduled  Pap Smear: 04/2016  Bone Density: 05/2013  Colon Screening: 03/2009  Eye Doctor: as needed  Dental Exam: biannually   Providers:   PCP: Webb Silversmith, NP-C   I have personally reviewed and have noted:  1. The  patient's medical and social history 2. Their use of alcohol, tobacco or illicit drugs 3. Their current medications and supplements 4. The patient's functional ability including ADL's, fall risks, home safety risks and hearing or visual impairment. 5. Diet and physical activities 6. Evidence for depression or mood disorder  Subjective:   Review of Systems:   Constitutional: Denies fever, malaise, fatigue, headache or abrupt weight changes.  HEENT: Denies eye pain, eye redness, ear pain, ringing in the ears, wax buildup, runny nose, nasal congestion, bloody nose, or sore throat. Respiratory: Denies difficulty breathing, shortness of breath, cough or sputum production.   Cardiovascular: Denies chest pain, chest tightness, palpitations or  swelling in the hands or feet.  Gastrointestinal: Pt reports intermittent constipation. Denies abdominal pain, bloating, diarrhea or blood in the stool.  GU: Pt reports urinary frequency. Denies urgency, pain with urination, burning sensation, blood in urine, odor or discharge. Musculoskeletal: Denies decrease in range of motion, difficulty with gait, muscle pain or joint pain and swelling.  Skin: Denies redness, rashes, lesions or ulcercations.  Neurological: Denies dizziness, difficulty with memory, difficulty with speech or problems with balance and coordination.  Psych: Pt has a history of depression. Denies anxiety, SI/HI.  No other specific complaints in a complete review of systems (except as listed in HPI above).  Objective:  PE:   BP 120/80   Pulse 60   Temp 97.9 F (36.6 C) (Oral)   Ht 5' 5.75" (1.67 m)   Wt 167 lb (75.8 kg)   SpO2 98%   BMI 27.16 kg/m   Wt Readings from Last 3 Encounters:  05/05/16 172 lb 12 oz (78.4 kg)  01/29/16 169 lb (76.7 kg)  05/02/15 162 lb (73.5 kg)    General: Appears her stated age, well developed, well nourished in NAD. Skin: Warm, dry and intact.  HEENT: Head: normal shape and size; Eyes: sclera white, no icterus, conjunctiva pink, PERRLA and EOMs intact;  Neck: Neck supple, trachea midline. No masses, lumps present. Surgical scar noted over anterior neck.  Cardiovascular: Normal rate and rhythm. S1,S2 noted.  No murmur, rubs or gallops noted. No JVD or BLE edema. No carotid bruits noted. Pulmonary/Chest: Normal effort and positive vesicular breath sounds. No respiratory distress. No wheezes, rales or ronchi noted.  Abdomen: Soft and nontender. Normal bowel sounds. No distention or masses noted. Liver, spleen and kidneys non palpable. Musculoskeletal:  Strength 5/5 BUE/BLE. No signs of joint swelling.  Neurological: Alert and oriented. Cranial nerves II-XII grossly intact. Coordination normal.  Psychiatric: Mood and affect normal.  Behavior is normal. Judgment and thought content normal.   BMET    Component Value Date/Time   NA 141 08/26/2016 0845   NA 142 05/02/2015 1103   K 4.4 08/26/2016 0845   CL 105 08/26/2016 0845   CO2 33 (H) 08/26/2016 0845   GLUCOSE 103 (H) 08/26/2016 0845   BUN 18 08/26/2016 0845   BUN 21 05/02/2015 1103   CREATININE 0.93 08/26/2016 0845   CALCIUM 9.9 08/26/2016 0845   GFRNONAA 66 05/02/2015 1103   GFRAA 76 05/02/2015 1103    Lipid Panel     Component Value Date/Time   CHOL 168 08/26/2016 0845   CHOL 254 (H) 05/02/2015 1103   TRIG 169.0 (H) 08/26/2016 0845   HDL 51.00 08/26/2016 0845   HDL 62 05/02/2015 1103   CHOLHDL 3 08/26/2016 0845   VLDL 33.8 08/26/2016 0845   LDLCALC 83 08/26/2016 0845   LDLCALC 160 (H) 05/02/2015 1103  CBC    Component Value Date/Time   WBC 6.3 05/05/2016 0859   RBC 4.74 05/05/2016 0859   HGB 14.1 05/05/2016 0859   HGB 14.9 05/02/2015 1103   HCT 42.6 05/05/2016 0859   HCT 44.7 05/02/2015 1103   PLT 236.0 05/05/2016 0859   PLT 209 05/02/2015 1103   MCV 89.9 05/05/2016 0859   MCV 90 05/02/2015 1103   MCH 30.1 05/02/2015 1103   MCHC 33.1 05/05/2016 0859   RDW 14.1 05/05/2016 0859   RDW 13.7 05/02/2015 1103   LYMPHSABS 2.5 05/02/2015 1103   EOSABS 0.1 05/02/2015 1103   BASOSABS 0.0 05/02/2015 1103    Hgb A1C No results found for: HGBA1C    Assessment and Plan:   Medicare Annual Wellness Visit:  Diet: She does eat lean meat. She consumes fruits and veggies daily. She tries to avoid fried foods. She drinks mostly water. Physical activity: She uses hand weights 4 x week Depression/mood screen: Chronic, on meds. Hearing: Intact to whispered voice Visual acuity: Grossly normal ADLs: Capable Fall risk: None Home safety: Good Cognitive evaluation: Intact to orientation, naming, recall and repetition EOL planning: Adv directives, full code/ I agree  Preventative Medicine: Flu, pneumovax, prevnar and zostovax UTD. She declines  tetanus and shingrix. Pap smear, mammogram, colonoscopy UTD. She declines bone density at this time. Encouraged her to consume a balanced diet and exercise regimen. Advised her to see an eye doctor and dentist annually. Will check CBC, CMET, Lipid, Vit D, TSH, Free T4 and CA125.  Urinary Frequency:  Likely due to Lithium Urinalysis: 1+ blood, 1+ leuks Will send urine culture abx only if positive  Next appointment: 1 year, Medicare Wellness Exam   Webb Silversmith, NP

## 2017-05-07 NOTE — Assessment & Plan Note (Signed)
Stable on Lithium and Remeron Support offered today Will monitor

## 2017-05-08 LAB — CA 125: CA 125: 6 U/mL (ref <35)

## 2017-05-10 LAB — URINE CULTURE
MICRO NUMBER:: 90230655
SPECIMEN QUALITY:: ADEQUATE

## 2017-05-11 ENCOUNTER — Other Ambulatory Visit: Payer: Self-pay | Admitting: Internal Medicine

## 2017-05-11 MED ORDER — NITROFURANTOIN MONOHYD MACRO 100 MG PO CAPS: 100.0000 mg | ORAL_CAPSULE | Freq: Two times a day (BID) | ORAL | 0 refills | Status: DC

## 2017-05-13 ENCOUNTER — Encounter: Payer: Medicare HMO | Admitting: Internal Medicine

## 2017-05-30 ENCOUNTER — Other Ambulatory Visit: Payer: Self-pay | Admitting: Internal Medicine

## 2017-05-30 DIAGNOSIS — F3170 Bipolar disorder, currently in remission, most recent episode unspecified: Secondary | ICD-10-CM

## 2017-06-01 NOTE — Telephone Encounter (Signed)
Last filled 03/18/17.Marland Kitchen Please advise

## 2017-06-16 ENCOUNTER — Ambulatory Visit
Admission: RE | Admit: 2017-06-16 | Discharge: 2017-06-16 | Disposition: A | Discharge status: Home / Self Care | Payer: Medicare HMO | Source: Ambulatory Visit | Attending: Internal Medicine | Admitting: Internal Medicine

## 2017-06-16 DIAGNOSIS — Z1231 Encounter for screening mammogram for malignant neoplasm of breast: Secondary | ICD-10-CM | POA: Diagnosis not present

## 2017-06-16 DIAGNOSIS — E039 Hypothyroidism, unspecified: Secondary | ICD-10-CM | POA: Diagnosis not present

## 2017-06-24 DIAGNOSIS — E039 Hypothyroidism, unspecified: Secondary | ICD-10-CM | POA: Diagnosis not present

## 2017-06-24 DIAGNOSIS — R5383 Other fatigue: Secondary | ICD-10-CM | POA: Diagnosis not present

## 2017-07-14 ENCOUNTER — Other Ambulatory Visit: Payer: Self-pay | Admitting: Internal Medicine

## 2017-07-14 DIAGNOSIS — E7800 Pure hypercholesterolemia, unspecified: Secondary | ICD-10-CM

## 2017-08-19 DIAGNOSIS — Z08 Encounter for follow-up examination after completed treatment for malignant neoplasm: Secondary | ICD-10-CM | POA: Diagnosis not present

## 2017-08-19 DIAGNOSIS — L821 Other seborrheic keratosis: Secondary | ICD-10-CM | POA: Diagnosis not present

## 2017-08-19 DIAGNOSIS — Z85828 Personal history of other malignant neoplasm of skin: Secondary | ICD-10-CM | POA: Diagnosis not present

## 2017-10-25 ENCOUNTER — Other Ambulatory Visit: Payer: Self-pay | Admitting: Internal Medicine

## 2017-10-25 DIAGNOSIS — F3170 Bipolar disorder, currently in remission, most recent episode unspecified: Secondary | ICD-10-CM

## 2017-10-27 NOTE — Telephone Encounter (Signed)
Last filled 06/02/17 90 day supply with 1 refill... Last OV 04/2017 AWV... Please advise

## 2017-12-01 ENCOUNTER — Other Ambulatory Visit: Payer: Self-pay | Admitting: Internal Medicine

## 2017-12-01 DIAGNOSIS — E7800 Pure hypercholesterolemia, unspecified: Secondary | ICD-10-CM

## 2017-12-30 DIAGNOSIS — E039 Hypothyroidism, unspecified: Secondary | ICD-10-CM | POA: Diagnosis not present

## 2018-01-06 DIAGNOSIS — R5383 Other fatigue: Secondary | ICD-10-CM | POA: Diagnosis not present

## 2018-01-06 DIAGNOSIS — E039 Hypothyroidism, unspecified: Secondary | ICD-10-CM | POA: Diagnosis not present

## 2018-02-03 ENCOUNTER — Other Ambulatory Visit: Payer: Self-pay | Admitting: Internal Medicine

## 2018-02-03 DIAGNOSIS — E7800 Pure hypercholesterolemia, unspecified: Secondary | ICD-10-CM

## 2018-03-15 ENCOUNTER — Other Ambulatory Visit: Payer: Self-pay | Admitting: Internal Medicine

## 2018-03-15 DIAGNOSIS — F3170 Bipolar disorder, currently in remission, most recent episode unspecified: Secondary | ICD-10-CM

## 2018-03-15 NOTE — Telephone Encounter (Signed)
Last filled 10/27/17 90 day with 1 refill, has upcoming appt Feb 2020

## 2018-03-22 ENCOUNTER — Other Ambulatory Visit: Payer: Self-pay | Admitting: Family Medicine

## 2018-03-22 ENCOUNTER — Other Ambulatory Visit: Payer: Self-pay | Admitting: Internal Medicine

## 2018-03-22 DIAGNOSIS — Z1231 Encounter for screening mammogram for malignant neoplasm of breast: Secondary | ICD-10-CM

## 2018-04-12 ENCOUNTER — Other Ambulatory Visit: Payer: Self-pay | Admitting: Internal Medicine

## 2018-04-12 DIAGNOSIS — E7800 Pure hypercholesterolemia, unspecified: Secondary | ICD-10-CM

## 2018-04-21 ENCOUNTER — Telehealth: Payer: Self-pay

## 2018-04-21 ENCOUNTER — Encounter: Payer: Self-pay | Admitting: Family Medicine

## 2018-04-21 ENCOUNTER — Ambulatory Visit (INDEPENDENT_AMBULATORY_CARE_PROVIDER_SITE_OTHER): Payer: Medicare HMO | Admitting: Family Medicine

## 2018-04-21 VITALS — BP 128/62 | HR 100 | Temp 98.4°F | Resp 14 | Ht 65.75 in | Wt 166.5 lb

## 2018-04-21 DIAGNOSIS — R197 Diarrhea, unspecified: Secondary | ICD-10-CM

## 2018-04-21 NOTE — Telephone Encounter (Signed)
Patient Name: Felicia Ross Gender: Female DOB: 23-Aug-1945 Age: 73 Y 2 M 17 D Return Phone Number: [phone] (Primary) Address: City/State/Zip: [place] [address] Client [facility] - Client Client Site [place] Physician Webb Silversmith - NP Contact Type Call Who Is Calling Patient / Member / Family / Caregiver Call Type Triage / Clinical Relationship To Patient Self Return Phone Number [phone] (Primary) Chief Complaint Abdominal Pain Reason for Call Symptomatic / Request for Health Information Initial Comment Caller states c/o diarrhea, weakness and abdominal pain. Translation No Nurse Assessment Nurse: Josiah Lobo, RN, Bethena Roys Date/Time (Eastern Time): 04/21/2018 7:37:24 AM Confirm and document reason for call. If symptomatic, describe symptoms. ---Caller states she is having diarrhea. She is feeling weak all over and abdominal pain. She has had diarrhea 12-15 times. Does the patient have any new or worsening symptoms? ---Yes Will a triage be completed? ---Yes Related visit to physician within the last 2 weeks? ---No Does the PT have any chronic conditions? (i.e. diabetes, asthma, this includes High risk factors for pregnancy, etc.) ---Yes List chronic conditions. ---hypothyroid Is this a behavioral health or substance abuse call? ---No Guidelines Guideline Title Affirmed Question Affirmed Notes Nurse Date/Time (Eastern Time) Diarrhea [1] SEVERE diarrhea (e.g., 7 or more times / day more than normal) AND [2] age > 80 years Susie Cassette 04/21/2018 7:38:11 AM Disp. Time Eilene Ghazi Time) Disposition Final User 04/21/2018 7:51:45 AM See HCP within 4 Hours (or PCP triage) Yes Josiah Lobo, RN, Derinda Late Disagree/Comply Comply Caller Understands Yes PLEASE NOTE: All timestamps contained within this report are represented as [place] Standard Time. CONFIDENTIALTY NOTICE: This fax transmission is intended only for  the addressee. It contains information that is legally privileged, confidential or otherwise protected from use or disclosure. If you are not the intended recipient, you are strictly prohibited from reviewing, disclosing, copying using or disseminating any of this information or taking any action in reliance on or regarding this information. If you have received this fax in error, please notify us immediately by telephone so that we can arrange for its return to [place]. Phone: [phone], Toll-Free: [phone], Fax: [phone] Page: 2 of 2 Call Id: [id number] PreDisposition Did not know what to do Care Advice Given Per Guideline SEE HCP WITHIN 4 HOURS (OR PCP TRIAGE): * IF OFFICE WILL BE OPEN: You need to be seen within the next 3 or 4 hours. Call your doctor (or NP/PA) now or as soon as the office opens. CALL BACK IF: * You become worse. CARE ADVICE given per Diarrhea (Adult) guideline. Comments User: Genene Churn, RN Date/Time Eilene Ghazi Time): 04/21/2018 7:42:53 AM pain level 5 or 6 out of 10 User: Genene Churn, RN Date/Time Eilene Ghazi Time): 04/21/2018 7:51:42 AM warm transferred to Shirlean Mylar in the office and appointment for 8:20 am was made Referrals REFERRED TO PCP OFFICE REFERRED TO PCP OFFICE

## 2018-04-21 NOTE — Progress Notes (Signed)
Subjective:     Felicia Ross is a 73 y.o. female presenting for Abdominal Pain (ate out last night at 6 pm and had a chicken/vegetable dish and it had green bell pepper in it and that upsets patient's stomach and before when she had this a long time ago she had diarrhea. She did not realize it had green pepper in it. Started with diarrhea last night around 8:30 pm. Has had diarrhea 12 to 15 times since that time through today. Feels nauseas. No vomitng so far. )     Diarrhea   This is a new problem. The current episode started yesterday. The problem occurs more than 10 times per day. The problem has been gradually worsening. Diarrhea characteristics: no blood, loose stools and now watery. The patient states that diarrhea awakens her from sleep. Associated symptoms include abdominal pain and chills. Pertinent negatives include no arthralgias, coughing, fever, headaches, increased  flatus, myalgias, sweats, URI or vomiting. Exacerbated by: green peppers. Risk factors include suspect food intake. She has tried nothing for the symptoms. There is no history of bowel resection, irritable bowel syndrome or a recent abdominal surgery.   Raw green peppers have caused diarrhea in the past. Had this last night.  In the past improved in the morning No sick contact  Tried warm jello this morning but had another BM Sips of water last night  Endorses still urinating with diarrhea - so unsure what color   Review of Systems  Constitutional: Positive for chills. Negative for fever.  Respiratory: Negative for cough.   Gastrointestinal: Positive for abdominal pain, diarrhea and nausea. Negative for blood in stool, flatus and vomiting.  Musculoskeletal: Negative for arthralgias and myalgias.  Neurological: Negative for headaches.     Social History   Tobacco Use  Smoking Status Never Smoker  Smokeless Tobacco Never Used        Objective:    BP Readings from Last 3 Encounters:  04/21/18  128/62  05/07/17 120/80  05/05/16 120/76   Wt Readings from Last 3 Encounters:  04/21/18 166 lb 8 oz (75.5 kg)  05/07/17 167 lb (75.8 kg)  05/05/16 172 lb 12 oz (78.4 kg)    BP 128/62   Pulse 100   Temp 98.4 F (36.9 C)   Resp 14   Ht 5' 5.75" (1.67 m)   Wt 166 lb 8 oz (75.5 kg)   SpO2 94%   BMI 27.08 kg/m    Physical Exam Constitutional:      General: She is not in acute distress.    Appearance: She is well-developed. She is not diaphoretic.  HENT:     Head: Normocephalic and atraumatic.     Right Ear: External ear normal.     Left Ear: External ear normal.     Nose: Nose normal.     Mouth/Throat:     Comments: Dry mucus membranes Eyes:     General: No scleral icterus.    Conjunctiva/sclera: Conjunctivae normal.  Neck:     Musculoskeletal: Neck supple.  Cardiovascular:     Rate and Rhythm: Regular rhythm. Tachycardia present.     Heart sounds: No murmur.  Pulmonary:     Effort: Pulmonary effort is normal. No respiratory distress.     Breath sounds: Normal breath sounds. No wheezing.  Abdominal:     General: Abdomen is flat. Bowel sounds are normal. There is no distension.     Palpations: Abdomen is soft. There is no hepatomegaly or mass.  Tenderness: There is abdominal tenderness in the periumbilical area and left lower quadrant. There is no right CVA tenderness, left CVA tenderness, guarding or rebound.  Skin:    General: Skin is warm and dry.     Capillary Refill: Capillary refill takes less than 2 seconds.  Neurological:     General: No focal deficit present.     Mental Status: She is alert. Mental status is at baseline.  Psychiatric:        Mood and Affect: Mood normal.        Behavior: Behavior normal.           Assessment & Plan:   Problem List Items Addressed This Visit    None    Visit Diagnoses    Diarrhea, unspecified type    -  Primary     Suspect food related etiology given onset and no red flag symptoms.   Some abdominal pain  but no fevers. Strict return precautions discussed - fevers/worsening abdominal pain/lightheadedness >> ER  Given that this may be related to food, said it was OK to try imodium.   Emphasis on hydration - will get Gatorade and push fluids   Return if symptoms worsen or fail to improve.  Lesleigh Noe, MD

## 2018-04-21 NOTE — Patient Instructions (Addendum)
Diarrhea, Adult Diarrhea is when you pass loose and watery poop (stool) often. Diarrhea can make you feel weak and cause you to lose water in your body (get dehydrated). Losing water in your body can cause you to:  Feel tired and thirsty.  Have a dry mouth.  Go pee (urinate) less often. Diarrhea often lasts 2-3 days. However, it can last longer if it is a sign of something more serious. It is important to treat your diarrhea as told by your doctor. Follow these instructions at home: Eating and drinking     Follow these instructions as told by your doctor:  Take an ORS (oral rehydration solution). This is a drink that helps you replace fluids and minerals your body lost. It is sold at pharmacies and stores.  Drink plenty of fluids, such as: ? Water. ? Ice chips. ? Diluted fruit juice. ? Low-calorie sports drinks. ? Milk, if you want.  Avoid drinking fluids that have a lot of sugar or caffeine in them.  Eat bland, easy-to-digest foods in small amounts as you are able. These foods include: ? Bananas. ? Applesauce. ? Rice. ? Low-fat (lean) meats. ? Toast. ? Crackers.  Avoid alcohol.  Avoid spicy or fatty foods.  Medicines  Can try over the counter Imodium to help slow down the stool General instructions   Wash your hands often using soap and water. If soap and water are not available, use a hand sanitizer. Others in your home should wash their hands as well. Hands should be washed: ? After using the toilet or changing a diaper. ? Before preparing, cooking, or serving food. ? While caring for a sick person. ? While visiting someone in a hospital.  Drink enough fluid to keep your pee (urine) pale yellow.  Rest at home while you get better.  Watch your condition for any changes.  Take a warm bath to help with any burning or pain from having diarrhea.  Keep all follow-up visits as told by your doctor. This is important. Contact a doctor if:  You have a  fever.  Your diarrhea gets worse.  You have new symptoms.  You cannot keep fluids down.  You feel light-headed or dizzy.  You have a headache.  You have muscle cramps. Get help right away if:  You have chest pain.  You feel very weak or you pass out (faint).  You have bloody or black poop or poop that looks like tar.  You have very bad pain, cramping, or bloating in your belly (abdomen).  You have trouble breathing or you are breathing very quickly.  Your heart is beating very quickly.  Your skin feels cold and clammy.  You feel confused.  You have signs of losing too much water in your body, such as: ? Dark pee, very little pee, or no pee. ? Cracked lips. ? Dry mouth. ? Sunken eyes. ? Sleepiness. ? Weakness. Summary  Diarrhea is when you pass loose and watery poop (stool) often.  Diarrhea can make you feel weak and cause you to lose water in your body (get dehydrated).  Take an ORS (oral rehydration solution). This is a drink that is sold at pharmacies and stores.  Eat bland, easy-to-digest foods in small amounts as you are able.  Contact a doctor if your condition gets worse. Get help right away if you have signs that you have lost too much water in your body. This information is not intended to replace advice given to you by  your health care provider. Make sure you discuss any questions you have with your health care provider. Document Released: 08/20/2007 Document Revised: 08/07/2017 Document Reviewed: 08/07/2017 Elsevier Interactive Patient Education  2019 [employer].

## 2018-04-21 NOTE — Telephone Encounter (Signed)
Patient seen in office today by Avie Echevaria, NP

## 2018-05-10 ENCOUNTER — Encounter: Payer: Medicare HMO | Admitting: Internal Medicine

## 2018-05-11 ENCOUNTER — Ambulatory Visit (INDEPENDENT_AMBULATORY_CARE_PROVIDER_SITE_OTHER): Payer: Medicare HMO | Admitting: Internal Medicine

## 2018-05-11 ENCOUNTER — Encounter: Payer: Self-pay | Admitting: Internal Medicine

## 2018-05-11 VITALS — BP 124/78 | HR 71 | Temp 98.0°F | Ht 65.75 in | Wt 166.0 lb

## 2018-05-11 DIAGNOSIS — N289 Disorder of kidney and ureter, unspecified: Secondary | ICD-10-CM

## 2018-05-11 DIAGNOSIS — E890 Postprocedural hypothyroidism: Secondary | ICD-10-CM

## 2018-05-11 DIAGNOSIS — M8589 Other specified disorders of bone density and structure, multiple sites: Secondary | ICD-10-CM

## 2018-05-11 DIAGNOSIS — E7800 Pure hypercholesterolemia, unspecified: Secondary | ICD-10-CM

## 2018-05-11 DIAGNOSIS — Z0000 Encounter for general adult medical examination without abnormal findings: Secondary | ICD-10-CM | POA: Diagnosis not present

## 2018-05-11 DIAGNOSIS — F319 Bipolar disorder, unspecified: Secondary | ICD-10-CM | POA: Diagnosis not present

## 2018-05-11 DIAGNOSIS — F3110 Bipolar disorder, current episode manic without psychotic features, unspecified: Secondary | ICD-10-CM | POA: Diagnosis not present

## 2018-05-11 LAB — TSH: TSH: 8.62 u[IU]/mL — ABNORMAL HIGH (ref 0.35–4.50)

## 2018-05-11 LAB — COMPREHENSIVE METABOLIC PANEL
ALT: 20 U/L (ref 0–35)
AST: 21 U/L (ref 0–37)
Albumin: 4.5 g/dL (ref 3.5–5.2)
Alkaline Phosphatase: 83 U/L (ref 39–117)
BUN: 19 mg/dL (ref 6–23)
CO2: 31 mEq/L (ref 19–32)
Calcium: 10.1 mg/dL (ref 8.4–10.5)
Chloride: 100 mEq/L (ref 96–112)
Creatinine, Ser: 1.08 mg/dL (ref 0.40–1.20)
GFR: 49.97 mL/min — ABNORMAL LOW (ref >60.00)
Glucose, Bld: 93 mg/dL (ref 70–99)
Potassium: 4.9 mEq/L (ref 3.5–5.1)
Sodium: 139 mEq/L (ref 135–145)
Total Bilirubin: 0.9 mg/dL (ref 0.2–1.2)
Total Protein: 7.6 g/dL (ref 6.0–8.3)

## 2018-05-11 LAB — CBC
HCT: 45.1 % (ref 36.0–46.0)
Hemoglobin: 14.8 g/dL (ref 12.0–15.0)
MCHC: 32.9 g/dL (ref 30.0–36.0)
MCV: 89.8 fl (ref 78.0–100.0)
Platelets: 233 10*3/uL (ref 150.0–400.0)
RBC: 5.02 Mil/uL (ref 3.87–5.11)
RDW: 13.9 % (ref 11.5–15.5)
WBC: 7.3 10*3/uL (ref 4.0–10.5)

## 2018-05-11 LAB — LIPID PANEL
Cholesterol: 194 mg/dL (ref 0–200)
HDL: 55.7 mg/dL (ref >39.00)
NonHDL: 138.77
Total CHOL/HDL Ratio: 3
Triglycerides: 232 mg/dL — ABNORMAL HIGH (ref 0.0–149.0)
VLDL: 46.4 mg/dL — ABNORMAL HIGH (ref 0.0–40.0)

## 2018-05-11 LAB — VITAMIN D 25 HYDROXY (VIT D DEFICIENCY, FRACTURES): VITD: 72.31 ng/mL (ref 30.00–100.00)

## 2018-05-11 LAB — T4, FREE: Free T4: 0.99 ng/dL (ref 0.60–1.60)

## 2018-05-11 LAB — LDL CHOLESTEROL, DIRECT: Direct LDL: 112 mg/dL

## 2018-05-11 MED ORDER — ZOSTER VAC RECOMB ADJUVANTED 50 MCG/0.5ML IM SUSR: 0.5000 mL | Freq: Once | INTRAMUSCULAR | 1 refills | Status: AC

## 2018-05-11 NOTE — Assessment & Plan Note (Signed)
Vit D and bone density ordered today Continue Calcium and Vit D OTC Encouraged 30 minutes of weight bearing exercise daily

## 2018-05-11 NOTE — Assessment & Plan Note (Signed)
Continue Lithium and Remeron CMET today

## 2018-05-11 NOTE — Assessment & Plan Note (Signed)
CMET and Lipid profile today Encouraged her to consume a low fat diet Continue Simvastatin and Fish Oil for now

## 2018-05-11 NOTE — Patient Instructions (Signed)
Health Maintenance for Postmenopausal Women Menopause is a normal process in which your reproductive ability comes to an end. This process happens gradually over a span of months to years, usually between the ages of 62 and 89. Menopause is complete when you have missed 12 consecutive menstrual periods. It is important to talk with your health care provider about some of the most common conditions that affect postmenopausal women, such as heart disease, cancer, and bone loss (osteoporosis). Adopting a healthy lifestyle and getting preventive care can help to promote your health and wellness. Those actions can also lower your chances of developing some of these common conditions. What should I know about menopause? During menopause, you may experience a number of symptoms, such as:  Moderate-to-severe hot flashes.  Night sweats.  Decrease in sex drive.  Mood swings.  Headaches.  Tiredness.  Irritability.  Memory problems.  Insomnia. Choosing to treat or not to treat menopausal changes is an individual decision that you make with your health care provider. What should I know about hormone replacement therapy and supplements? Hormone therapy products are effective for treating symptoms that are associated with menopause, such as hot flashes and night sweats. Hormone replacement carries certain risks, especially as you become older. If you are thinking about using estrogen or estrogen with progestin treatments, discuss the benefits and risks with your health care provider. What should I know about heart disease and stroke? Heart disease, heart attack, and stroke become more likely as you age. This may be due, in part, to the hormonal changes that your body experiences during menopause. These can affect how your body processes dietary fats, triglycerides, and cholesterol. Heart attack and stroke are both medical emergencies. There are many things that you can do to help prevent heart disease  and stroke:  Have your blood pressure checked at least every 1-2 years. High blood pressure causes heart disease and increases the risk of stroke.  If you are 79-72 years old, ask your health care provider if you should take aspirin to prevent a heart attack or a stroke.  Do not use any tobacco products, including cigarettes, chewing tobacco, or electronic cigarettes. If you need help quitting, ask your health care provider.  It is important to eat a healthy diet and maintain a healthy weight. ? Be sure to include plenty of vegetables, fruits, low-fat dairy products, and lean protein. ? Avoid eating foods that are high in solid fats, added sugars, or salt (sodium).  Get regular exercise. This is one of the most important things that you can do for your health. ? Try to exercise for at least 150 minutes each week. The type of exercise that you do should increase your heart rate and make you sweat. This is known as moderate-intensity exercise. ? Try to do strengthening exercises at least twice each week. Do these in addition to the moderate-intensity exercise.  Know your numbers.Ask your health care provider to check your cholesterol and your blood glucose. Continue to have your blood tested as directed by your health care provider.  What should I know about cancer screening? There are several types of cancer. Take the following steps to reduce your risk and to catch any cancer development as early as possible. Breast Cancer  Practice breast self-awareness. ? This means understanding how your breasts normally appear and feel. ? It also means doing regular breast self-exams. Let your health care provider know about any changes, no matter how small.  If you are 40 or  older, have a clinician do a breast exam (clinical breast exam or CBE) every year. Depending on your age, family history, and medical history, it may be recommended that you also have a yearly breast X-ray (mammogram).  If you  have a family history of breast cancer, talk with your health care provider about genetic screening.  If you are at high risk for breast cancer, talk with your health care provider about having an MRI and a mammogram every year.  Breast cancer (BRCA) gene test is recommended for women who have family members with BRCA-related cancers. Results of the assessment will determine the need for genetic counseling and BRCA1 and for BRCA2 testing. BRCA-related cancers include these types: ? Breast. This occurs in males or females. ? Ovarian. ? Tubal. This may also be called fallopian tube cancer. ? Cancer of the abdominal or pelvic lining (peritoneal cancer). ? Prostate. ? Pancreatic. Cervical, Uterine, and Ovarian Cancer Your health care provider may recommend that you be screened regularly for cancer of the pelvic organs. These include your ovaries, uterus, and vagina. This screening involves a pelvic exam, which includes checking for microscopic changes to the surface of your cervix (Pap test).  For women ages 21-65, health care providers may recommend a pelvic exam and a Pap test every three years. For women ages 39-65, they may recommend the Pap test and pelvic exam, combined with testing for human papilloma virus (HPV), every five years. Some types of HPV increase your risk of cervical cancer. Testing for HPV may also be done on women of any age who have unclear Pap test results.  Other health care providers may not recommend any screening for nonpregnant women who are considered low risk for pelvic cancer and have no symptoms. Ask your health care provider if a screening pelvic exam is right for you.  If you have had past treatment for cervical cancer or a condition that could lead to cancer, you need Pap tests and screening for cancer for at least 20 years after your treatment. If Pap tests have been discontinued for you, your risk factors (such as having a new sexual partner) need to be reassessed  to determine if you should start having screenings again. Some women have medical problems that increase the chance of getting cervical cancer. In these cases, your health care provider may recommend that you have screening and Pap tests more often.  If you have a family history of uterine cancer or ovarian cancer, talk with your health care provider about genetic screening.  If you have vaginal bleeding after reaching menopause, tell your health care provider.  There are currently no reliable tests available to screen for ovarian cancer. Lung Cancer Lung cancer screening is recommended for adults 57-50 years old who are at high risk for lung cancer because of a history of smoking. A yearly low-dose CT scan of the lungs is recommended if you:  Currently smoke.  Have a history of at least 30 pack-years of smoking and you currently smoke or have quit within the past 15 years. A pack-year is smoking an average of one pack of cigarettes per day for one year. Yearly screening should:  Continue until it has been 15 years since you quit.  Stop if you develop a health problem that would prevent you from having lung cancer treatment. Colorectal Cancer  This type of cancer can be detected and can often be prevented.  Routine colorectal cancer screening usually begins at age 12 and continues through  age 75.  If you have risk factors for colon cancer, your health care provider may recommend that you be screened at an earlier age.  If you have a family history of colorectal cancer, talk with your health care provider about genetic screening.  Your health care provider may also recommend using home test kits to check for hidden blood in your stool.  A small camera at the end of a tube can be used to examine your colon directly (sigmoidoscopy or colonoscopy). This is done to check for the earliest forms of colorectal cancer.  Direct examination of the colon should be repeated every 5-10 years until  age 75. However, if early forms of precancerous polyps or small growths are found or if you have a family history or genetic risk for colorectal cancer, you may need to be screened more often. Skin Cancer  Check your skin from head to toe regularly.  Monitor any moles. Be sure to tell your health care provider: ? About any new moles or changes in moles, especially if there is a change in a mole's shape or color. ? If you have a mole that is larger than the size of a pencil eraser.  If any of your family members has a history of skin cancer, especially at a young age, talk with your health care provider about genetic screening.  Always use sunscreen. Apply sunscreen liberally and repeatedly throughout the day.  Whenever you are outside, protect yourself by wearing long sleeves, pants, a wide-brimmed hat, and sunglasses. What should I know about osteoporosis? Osteoporosis is a condition in which bone destruction happens more quickly than new bone creation. After menopause, you may be at an increased risk for osteoporosis. To help prevent osteoporosis or the bone fractures that can happen because of osteoporosis, the following is recommended:  If you are 19-50 years old, get at least 1,000 mg of calcium and at least 600 mg of vitamin D per day.  If you are older than age 50 but younger than age 70, get at least 1,200 mg of calcium and at least 600 mg of vitamin D per day.  If you are older than age 70, get at least 1,200 mg of calcium and at least 800 mg of vitamin D per day. Smoking and excessive alcohol intake increase the risk of osteoporosis. Eat foods that are rich in calcium and vitamin D, and do weight-bearing exercises several times each week as directed by your health care provider. What should I know about how menopause affects my mental health? Depression may occur at any age, but it is more common as you become older. Common symptoms of depression include:  Low or sad  mood.  Changes in sleep patterns.  Changes in appetite or eating patterns.  Feeling an overall lack of motivation or enjoyment of activities that you previously enjoyed.  Frequent crying spells. Talk with your health care provider if you think that you are experiencing depression. What should I know about immunizations? It is important that you get and maintain your immunizations. These include:  Tetanus, diphtheria, and pertussis (Tdap) booster vaccine.  Influenza every year before the flu season begins.  Pneumonia vaccine.  Shingles vaccine. Your health care provider may also recommend other immunizations. This information is not intended to replace advice given to you by your health care provider. Make sure you discuss any questions you have with your health care provider. Document Released: 04/25/2005 Document Revised: 09/21/2015 Document Reviewed: 12/05/2014 Elsevier Interactive Patient Education    2019 [place].

## 2018-05-11 NOTE — Progress Notes (Signed)
HPI:  Pt presents to the clinic today for her Medicare Wellness Exam. She is also due to follow up chronic conditions.  Bipolar Depression: Managed on Lithium and Remeron. She no longer follows with psychiatry. She denies SI/HI.  HLD: Her last LDL was 79, triglycerides 154, 04/2017. She denies myalgias on Simvastatin and Fish Oil. She tries to consume a low fat diet.  Osteopenia: Her last bone density was 05/2013. She takes Calcium and Vit D OTC. She tries to get 30 minutes of weight bearing exercise daily.  Hypothyroidism: She denies any issues on her current dose of Levothyroxine. She is due to have her labs repeated today.  Past Medical History:  Diagnosis Date  . Basal cell carcinoma   . Bipolar disorder ([place])   . Depression   . Hypercholesteremia   . Osteopenia   . Thyroid goiter     Current Outpatient Medications  Medication Sig Dispense Refill  . Cholecalciferol (VITAMIN D) 2000 units CAPS Take 1 capsule by mouth daily.    Marland Kitchen levothyroxine (SYNTHROID, LEVOTHROID) 100 MCG tablet Take 1 tablet (100 mcg total) by mouth daily. 90 tablet 0  . lithium carbonate (LITHOBID) 300 MG CR tablet TAKE 1 TABLET TWICE DAILY 180 tablet 1  . mirtazapine (REMERON) 15 MG tablet TAKE 1 TABLET AT BEDTIME 90 tablet 1  . Multiple Vitamin (MULTIVITAMIN) tablet Take 1 tablet by mouth daily.    . Omega-3 Fatty Acids (FISH OIL) 1200 MG CAPS Take 1,200 mg by mouth 2 (two) times daily.     . simvastatin (ZOCOR) 10 MG tablet TAKE 1 TABLET AT BEDTIME 90 tablet 0   No current facility-administered medications for this visit.     Allergies  Allergen Reactions  . Fluzone [Flu Virus Vaccine] Hives    High dose only---hives, itchy    Family History  Problem Relation Age of Onset  . Ovarian cancer Mother   . Hypertension Mother   . Heart attack Father   . Hypertension Brother   . Breast cancer Maternal Aunt   . Lung cancer Paternal Aunt   . Breast cancer Paternal Aunt   . Hypertension Other   .  Breast cancer Cousin     Social History   Socioeconomic History  . Marital status: Married    Spouse name: Lewis   . Number of children: 3  . Years of education: College  . Highest education level: Not on file  Occupational History  . Occupation: Part-time  Social Needs  . Financial resource strain: Not on file  . Food insecurity:    Worry: Not on file    Inability: Not on file  . Transportation needs:    Medical: Not on file    Non-medical: Not on file  Tobacco Use  . Smoking status: Never Smoker  . Smokeless tobacco: Never Used  Substance and Sexual Activity  . Alcohol use: No    Alcohol/week: 0.0 standard drinks  . Drug use: No  . Sexual activity: Never  Lifestyle  . Physical activity:    Days per week: Not on file    Minutes per session: Not on file  . Stress: Not on file  Relationships  . Social connections:    Talks on phone: Not on file    Gets together: Not on file    Attends religious service: Not on file    Active member of club or organization: Not on file    Attends meetings of clubs or organizations: Not on file  Relationship status: Not on file  . Intimate partner violence:    Fear of current or ex partner: Not on file    Emotionally abused: Not on file    Physically abused: Not on file    Forced sexual activity: Not on file  Other Topics Concern  . Not on file  Social History Narrative  . Not on file    Hospitiliaztions: None  Health Maintenance:    Flu: 12/2017  Tetanus: 01/2005  Pneumovax: 01/2013  Prevnar: 05/2013  Zostavax: 2009  Shingrix: never  Mammogram: 06/2017  Pap Smear: 04/2016  Bone Density: 05/2013  Colon Screening: 03/2009  Eye Doctor: annually  Dental Exam: biannually   Providers:   PCP: Webb Silversmith, NP-C    I have personally reviewed and have noted:  1. The patient's medical and social history 2. Their use of alcohol, tobacco or illicit drugs 3. Their current medications and supplements 4. The patient's  functional ability including ADL's, fall risks, home safety risks and hearing or visual impairment. 5. Diet and physical activities 6. Evidence for depression or mood disorder  Subjective:   Review of Systems:   Constitutional: Denies fever, malaise, fatigue, headache or abrupt weight changes.  HEENT: Denies eye pain, eye redness, ear pain, ringing in the ears, wax buildup, runny nose, nasal congestion, bloody nose, or sore throat. Respiratory: Denies difficulty breathing, shortness of breath, cough or sputum production.   Cardiovascular: Denies chest pain, chest tightness, palpitations or swelling in the hands or feet.  Gastrointestinal: Denies abdominal pain, bloating, constipation, diarrhea or blood in the stool.  GU: Denies urgency, frequency, pain with urination, burning sensation, blood in urine, odor or discharge. Musculoskeletal: Denies decrease in range of motion, difficulty with gait, muscle pain or joint pain and swelling.  Skin: Denies redness, rashes, lesions or ulcercations.  Neurological: Denies dizziness, difficulty with memory, difficulty with speech or problems with balance and coordination.  Psych: Pt has a history of depression. Denies anxiety, SI/HI.  No other specific complaints in a complete review of systems (except as listed in HPI above).  Objective:  PE:  BP 124/78   Pulse 71   Temp 98 F (36.7 C) (Oral)   Ht 5' 5.75" (1.67 m)   Wt 166 lb (75.3 kg)   SpO2 97%   BMI 27.00 kg/m   Wt Readings from Last 3 Encounters:  04/21/18 166 lb 8 oz (75.5 kg)  05/07/17 167 lb (75.8 kg)  05/05/16 172 lb 12 oz (78.4 kg)    General: Appears her stated age, in NAD. Skin: Warm, dry and intact. No ulcerations noted. HEENT: Head: normal shape and size; Eyes: sclera white, no icterus, conjunctiva pink, PERRLA and EOMs intact; Ears: Tm's gray and intact, normal light reflex; Throat/Mouth: Teeth present, mucosa pink and moist, no exudate, lesions or ulcerations noted.   Neck: Neck supple, trachea midline. No masses, lumps present.  Cardiovascular: Normal rate and rhythm. S1,S2 noted.  No murmur, rubs or gallops noted. No JVD or BLE edema. No carotid bruits noted. Pulmonary/Chest: Normal effort and positive vesicular breath sounds. No respiratory distress. No wheezes, rales or ronchi noted.  Abdomen: Soft and nontender. Normal bowel sounds. No distention or masses noted. Liver, spleen and kidneys non palpable. Musculoskeletal: Strength 5/5 BUE/BLE. No signs of joint swelling.  Neurological: Alert and oriented. Cranial nerves II-XII grossly intact. Coordination normal.  Psychiatric: Mood and affect normal. Behavior is normal. Judgment and thought content normal.    BMET    Component Value Date/Time  NA 139 05/07/2017 0957   NA 142 05/02/2015 1103   K 4.5 05/07/2017 0957   CL 102 05/07/2017 0957   CO2 30 05/07/2017 0957   GLUCOSE 95 05/07/2017 0957   BUN 16 05/07/2017 0957   BUN 21 05/02/2015 1103   CREATININE 0.98 05/07/2017 0957   CALCIUM 9.9 05/07/2017 0957   GFRNONAA 66 05/02/2015 1103   GFRAA 76 05/02/2015 1103    Lipid Panel     Component Value Date/Time   CHOL 167 05/07/2017 0957   CHOL 254 (H) 05/02/2015 1103   TRIG 154.0 (H) 05/07/2017 0957   HDL 57.40 05/07/2017 0957   HDL 62 05/02/2015 1103   CHOLHDL 3 05/07/2017 0957   VLDL 30.8 05/07/2017 0957   LDLCALC 79 05/07/2017 0957   LDLCALC 160 (H) 05/02/2015 1103    CBC    Component Value Date/Time   WBC 6.5 05/07/2017 0957   RBC 4.89 05/07/2017 0957   HGB 14.6 05/07/2017 0957   HGB 14.9 05/02/2015 1103   HCT 44.1 05/07/2017 0957   HCT 44.7 05/02/2015 1103   PLT 202.0 05/07/2017 0957   PLT 209 05/02/2015 1103   MCV 90.2 05/07/2017 0957   MCV 90 05/02/2015 1103   MCH 30.1 05/02/2015 1103   MCHC 33.1 05/07/2017 0957   RDW 13.5 05/07/2017 0957   RDW 13.7 05/02/2015 1103   LYMPHSABS 2.5 05/02/2015 1103   EOSABS 0.1 05/02/2015 1103   BASOSABS 0.0 05/02/2015 1103    Hgb  A1C No results found for: HGBA1C    Assessment and Plan:   Medicare Annual Wellness Visit:  Diet: She does eat meat. She consumes fruits and veggies daily. She tries to avoid fried foods. She drinks mostly water, tea, coffee. Physical activity: Walking Depression/mood screen: Chronic but stable on meds Hearing: Intact to whispered voice Visual acuity: Grossly normal, performs annual eye exam  ADLs: Capable Fall risk: None Home safety: Good Cognitive evaluation: Intact to orientation, naming, recall and repetition EOL planning: Adv directives, full code/ I agree  Preventative Medicine: Flu, pneumovax, prevnar and zostovax UTD. She declines tetanus for financial reasons. Shingrix RX sent to pharmacy. Pap smear UTD. Mammogram scheduled. Bone density ordered. Colon screening UTD. Encouraged her to consume a balanced diet and exercise regimen. Advised her to see an eye doctor and dentist annually. Will check CBC, CMET, Lipid, TSH, Free T4 and Vit D today.   Next appointment:  6 months follow up chronic conditions   Webb Silversmith, NP

## 2018-05-11 NOTE — Assessment & Plan Note (Signed)
TSH and Free T4 today Will adjust Levothyroxine if needed based on labs 

## 2018-05-17 MED ORDER — LEVOTHYROXINE SODIUM 112 MCG PO TABS: 112.0000 ug | ORAL_TABLET | Freq: Every day | ORAL | 0 refills | Status: DC

## 2018-05-17 NOTE — Addendum Note (Signed)
Addended by: Lurlean Nanny on: 05/17/2018 04:12 PM   Modules accepted: Orders

## 2018-06-08 ENCOUNTER — Telehealth: Payer: Self-pay

## 2018-06-08 NOTE — Telephone Encounter (Signed)
Patient called asking if she still needs to come in for her lab appointment on April 1st for recheck of TSH and BMET levels. Patient advised provider will review and get back to her later today after a meeting to address lab draws.

## 2018-06-08 NOTE — Telephone Encounter (Signed)
Yes, she needs to come for that. It looks like we will have an outdoor tent option.

## 2018-06-10 ENCOUNTER — Other Ambulatory Visit: Payer: Self-pay

## 2018-06-10 ENCOUNTER — Other Ambulatory Visit: Payer: Medicare HMO

## 2018-06-10 ENCOUNTER — Ambulatory Visit (INDEPENDENT_AMBULATORY_CARE_PROVIDER_SITE_OTHER): Payer: Medicare HMO | Admitting: Internal Medicine

## 2018-06-10 NOTE — Progress Notes (Deleted)
HPI  Pt presents to the clinic today with c/o urinary symptoms.   Review of Systems  Past Medical History:  Diagnosis Date  . Basal cell carcinoma   . Bipolar disorder ([place])   . Depression   . Hypercholesteremia   . Osteopenia   . Thyroid goiter     Family History  Problem Relation Age of Onset  . Ovarian cancer Mother   . Hypertension Mother   . Heart attack Father   . Hypertension Brother   . Breast cancer Maternal Aunt   . Lung cancer Paternal Aunt   . Breast cancer Paternal Aunt   . Hypertension Other   . Breast cancer Cousin     Social History   Socioeconomic History  . Marital status: Married    Spouse name: Lewis   . Number of children: 3  . Years of education: College  . Highest education level: Not on file  Occupational History  . Occupation: Part-time  Social Needs  . Financial resource strain: Not on file  . Food insecurity:    Worry: Not on file    Inability: Not on file  . Transportation needs:    Medical: Not on file    Non-medical: Not on file  Tobacco Use  . Smoking status: Never Smoker  . Smokeless tobacco: Never Used  Substance and Sexual Activity  . Alcohol use: No    Alcohol/week: 0.0 standard drinks  . Drug use: No  . Sexual activity: Never  Lifestyle  . Physical activity:    Days per week: Not on file    Minutes per session: Not on file  . Stress: Not on file  Relationships  . Social connections:    Talks on phone: Not on file    Gets together: Not on file    Attends religious service: Not on file    Active member of club or organization: Not on file    Attends meetings of clubs or organizations: Not on file    Relationship status: Not on file  . Intimate partner violence:    Fear of current or ex partner: Not on file    Emotionally abused: Not on file    Physically abused: Not on file    Forced sexual activity: Not on file  Other Topics Concern  . Not on file  Social History Narrative  . Not on file    Allergies   Allergen Reactions  . Fluzone [Flu Virus Vaccine] Hives    High dose only---hives, itchy     Constitutional: Denies fever, malaise, fatigue, headache or abrupt weight changes.   GU: Pt reports urgency, frequency and pain with urination. Denies burning sensation, blood in urine, odor or discharge. Skin: Denies redness, rashes, lesions or ulcercations.   No other specific complaints in a complete review of systems (except as listed in HPI above).    Objective:   Physical Exam  There were no vitals taken for this visit. Wt Readings from Last 3 Encounters:  05/11/18 166 lb (75.3 kg)  04/21/18 166 lb 8 oz (75.5 kg)  05/07/17 167 lb (75.8 kg)    General: Appears her stated age, well developed, well nourished in NAD. Cardiovascular: Normal rate and rhythm. S1,S2 noted.   Pulmonary/Chest: Normal effort and positive vesicular breath sounds. No respiratory distress. No wheezes, rales or ronchi noted.  Abdomen: Soft. Normal bowel sounds. No distention or masses noted.  Tender to palpation over the bladder area. No CVA tenderness.  Assessment & Plan:   Urgency, Frequency, Dysuria secondary to   Urinalysis: Will send urine culture eRx sent if for Macrobid 100 mg BID x 5 days OK to take AZO OTC Drink plenty of fluids  RTC as needed or if symptoms persist. Webb Silversmith, NP

## 2018-06-11 LAB — BASIC METABOLIC PANEL
BUN: 18 mg/dL (ref 6–23)
CO2: 30 mEq/L (ref 19–32)
Calcium: 9.4 mg/dL (ref 8.4–10.5)
Chloride: 98 mEq/L (ref 96–112)
Creatinine, Ser: 0.98 mg/dL (ref 0.40–1.20)
GFR: 55.89 mL/min — ABNORMAL LOW (ref >60.00)
Glucose, Bld: 93 mg/dL (ref 70–99)
Potassium: 4.5 mEq/L (ref 3.5–5.1)
Sodium: 135 mEq/L (ref 135–145)

## 2018-06-11 LAB — TSH: TSH: 1.24 u[IU]/mL (ref 0.35–4.50)

## 2018-06-16 ENCOUNTER — Other Ambulatory Visit: Payer: Medicare HMO

## 2018-06-16 ENCOUNTER — Telehealth: Payer: Self-pay

## 2018-06-16 ENCOUNTER — Other Ambulatory Visit: Payer: Self-pay

## 2018-06-16 MED ORDER — LEVOTHYROXINE SODIUM 112 MCG PO TABS: 112.0000 ug | ORAL_TABLET | Freq: Every day | ORAL | 0 refills | Status: DC

## 2018-06-16 NOTE — Telephone Encounter (Signed)
Copied from [place] [id number]. Topic: General - Other >> Jun 15, 2018  9:56 PM Percell Belt A wrote: Reason for CRM: Pt called in and left VM on general mailbox-  she would like someone call her about her results of the most resent labs.  She is would like to know what her thyroid is looking like

## 2018-06-18 ENCOUNTER — Other Ambulatory Visit: Payer: Self-pay | Admitting: Internal Medicine

## 2018-06-18 DIAGNOSIS — E7800 Pure hypercholesterolemia, unspecified: Secondary | ICD-10-CM

## 2018-06-24 NOTE — Telephone Encounter (Signed)
Pt stated she never received a copy of her labs and please resend it.

## 2018-06-24 NOTE — Telephone Encounter (Signed)
Pt has to give it time, it was not sent out until Monday. She is aware of results as I spoke to her and I explained it takes time as it is sent via the hospital.

## 2018-06-28 ENCOUNTER — Other Ambulatory Visit: Payer: Medicare HMO

## 2018-07-23 ENCOUNTER — Telehealth: Payer: Self-pay | Admitting: Internal Medicine

## 2018-07-23 DIAGNOSIS — F3170 Bipolar disorder, currently in remission, most recent episode unspecified: Secondary | ICD-10-CM

## 2018-07-25 NOTE — Telephone Encounter (Signed)
Last filled 02/2018, AWV 04/2018--- please advise

## 2018-07-28 NOTE — Telephone Encounter (Signed)
Best number [phone]  Pt called stating [facility] called her to say they have not received anything on these 2 refills.  Please  return fax to  [phone]

## 2018-07-28 NOTE — Telephone Encounter (Signed)
Please let pt know when this has been done

## 2018-07-28 NOTE — Telephone Encounter (Signed)
Pt is aware Rx had been sent 07/26/2018 and she should let us know after rechecking with [facility] if they state they did not receive it

## 2018-08-11 ENCOUNTER — Other Ambulatory Visit: Payer: Self-pay | Admitting: Internal Medicine

## 2018-08-11 DIAGNOSIS — E039 Hypothyroidism, unspecified: Secondary | ICD-10-CM | POA: Diagnosis not present

## 2018-08-11 DIAGNOSIS — E7800 Pure hypercholesterolemia, unspecified: Secondary | ICD-10-CM

## 2018-08-18 DIAGNOSIS — E039 Hypothyroidism, unspecified: Secondary | ICD-10-CM | POA: Diagnosis not present

## 2018-08-30 ENCOUNTER — Ambulatory Visit
Admission: RE | Admit: 2018-08-30 | Discharge: 2018-08-30 | Disposition: A | Discharge status: Home / Self Care | Payer: Medicare HMO | Source: Ambulatory Visit | Attending: Internal Medicine | Admitting: Internal Medicine

## 2018-08-30 ENCOUNTER — Ambulatory Visit
Admission: RE | Admit: 2018-08-30 | Discharge: 2018-08-30 | Disposition: A | Discharge status: Home / Self Care | Payer: Medicare HMO | Source: Ambulatory Visit | Attending: Family Medicine | Admitting: Family Medicine

## 2018-08-30 ENCOUNTER — Other Ambulatory Visit: Payer: Self-pay | Admitting: Family Medicine

## 2018-08-30 ENCOUNTER — Other Ambulatory Visit: Payer: Self-pay

## 2018-08-30 ENCOUNTER — Encounter (INDEPENDENT_AMBULATORY_CARE_PROVIDER_SITE_OTHER): Payer: Self-pay

## 2018-08-30 DIAGNOSIS — M8588 Other specified disorders of bone density and structure, other site: Secondary | ICD-10-CM | POA: Diagnosis not present

## 2018-08-30 DIAGNOSIS — M8589 Other specified disorders of bone density and structure, multiple sites: Secondary | ICD-10-CM | POA: Diagnosis not present

## 2018-08-30 DIAGNOSIS — Z1231 Encounter for screening mammogram for malignant neoplasm of breast: Secondary | ICD-10-CM

## 2018-08-30 DIAGNOSIS — Z780 Asymptomatic menopausal state: Secondary | ICD-10-CM | POA: Diagnosis not present

## 2018-08-30 DIAGNOSIS — N6489 Other specified disorders of breast: Secondary | ICD-10-CM

## 2018-08-30 DIAGNOSIS — R928 Other abnormal and inconclusive findings on diagnostic imaging of breast: Secondary | ICD-10-CM

## 2018-09-08 ENCOUNTER — Ambulatory Visit
Admission: RE | Admit: 2018-09-08 | Discharge: 2018-09-08 | Disposition: A | Discharge status: Home / Self Care | Payer: Medicare HMO | Source: Ambulatory Visit | Attending: Family Medicine | Admitting: Family Medicine

## 2018-09-08 ENCOUNTER — Other Ambulatory Visit: Payer: Self-pay

## 2018-09-08 DIAGNOSIS — R928 Other abnormal and inconclusive findings on diagnostic imaging of breast: Secondary | ICD-10-CM | POA: Insufficient documentation

## 2018-09-08 DIAGNOSIS — R922 Inconclusive mammogram: Secondary | ICD-10-CM | POA: Diagnosis not present

## 2018-09-08 DIAGNOSIS — N6489 Other specified disorders of breast: Secondary | ICD-10-CM | POA: Diagnosis not present

## 2018-09-08 DIAGNOSIS — N6002 Solitary cyst of left breast: Secondary | ICD-10-CM | POA: Diagnosis not present

## 2018-12-22 ENCOUNTER — Other Ambulatory Visit: Payer: Self-pay | Admitting: Internal Medicine

## 2018-12-22 DIAGNOSIS — F3170 Bipolar disorder, currently in remission, most recent episode unspecified: Secondary | ICD-10-CM

## 2019-02-16 DIAGNOSIS — E039 Hypothyroidism, unspecified: Secondary | ICD-10-CM | POA: Diagnosis not present

## 2019-02-23 DIAGNOSIS — R5383 Other fatigue: Secondary | ICD-10-CM | POA: Diagnosis not present

## 2019-02-23 DIAGNOSIS — E039 Hypothyroidism, unspecified: Secondary | ICD-10-CM | POA: Diagnosis not present

## 2019-03-07 ENCOUNTER — Other Ambulatory Visit: Payer: Self-pay | Admitting: Family Medicine

## 2019-03-07 ENCOUNTER — Other Ambulatory Visit: Payer: Self-pay | Admitting: Internal Medicine

## 2019-03-07 DIAGNOSIS — Z1231 Encounter for screening mammogram for malignant neoplasm of breast: Secondary | ICD-10-CM

## 2019-04-29 ENCOUNTER — Ambulatory Visit: Payer: Medicare HMO

## 2019-05-10 DIAGNOSIS — H2513 Age-related nuclear cataract, bilateral: Secondary | ICD-10-CM | POA: Diagnosis not present

## 2019-05-16 ENCOUNTER — Encounter: Payer: Medicare HMO | Admitting: Internal Medicine

## 2019-06-16 ENCOUNTER — Encounter: Payer: Self-pay | Admitting: Internal Medicine

## 2019-06-16 ENCOUNTER — Ambulatory Visit (INDEPENDENT_AMBULATORY_CARE_PROVIDER_SITE_OTHER): Payer: Medicare HMO | Admitting: Internal Medicine

## 2019-06-16 ENCOUNTER — Ambulatory Visit (INDEPENDENT_AMBULATORY_CARE_PROVIDER_SITE_OTHER)
Admission: RE | Admit: 2019-06-16 | Discharge: 2019-06-16 | Disposition: A | Discharge status: Home / Self Care | Payer: Medicare HMO | Source: Ambulatory Visit | Attending: Internal Medicine | Admitting: Internal Medicine

## 2019-06-16 ENCOUNTER — Other Ambulatory Visit: Payer: Self-pay

## 2019-06-16 VITALS — BP 114/76 | HR 67 | Temp 98.2°F | Ht 66.5 in | Wt 166.0 lb

## 2019-06-16 DIAGNOSIS — E890 Postprocedural hypothyroidism: Secondary | ICD-10-CM | POA: Diagnosis not present

## 2019-06-16 DIAGNOSIS — M8589 Other specified disorders of bone density and structure, multiple sites: Secondary | ICD-10-CM | POA: Diagnosis not present

## 2019-06-16 DIAGNOSIS — Z1211 Encounter for screening for malignant neoplasm of colon: Secondary | ICD-10-CM | POA: Diagnosis not present

## 2019-06-16 DIAGNOSIS — M25551 Pain in right hip: Secondary | ICD-10-CM

## 2019-06-16 DIAGNOSIS — F3170 Bipolar disorder, currently in remission, most recent episode unspecified: Secondary | ICD-10-CM | POA: Diagnosis not present

## 2019-06-16 DIAGNOSIS — M1611 Unilateral primary osteoarthritis, right hip: Secondary | ICD-10-CM | POA: Diagnosis not present

## 2019-06-16 DIAGNOSIS — E7800 Pure hypercholesterolemia, unspecified: Secondary | ICD-10-CM | POA: Diagnosis not present

## 2019-06-16 DIAGNOSIS — Z0000 Encounter for general adult medical examination without abnormal findings: Secondary | ICD-10-CM

## 2019-06-16 LAB — COMPREHENSIVE METABOLIC PANEL
ALT: 16 U/L (ref 0–35)
AST: 18 U/L (ref 0–37)
Albumin: 4.3 g/dL (ref 3.5–5.2)
Alkaline Phosphatase: 81 U/L (ref 39–117)
BUN: 17 mg/dL (ref 6–23)
CO2: 32 mEq/L (ref 19–32)
Calcium: 9.5 mg/dL (ref 8.4–10.5)
Chloride: 102 mEq/L (ref 96–112)
Creatinine, Ser: 0.92 mg/dL (ref 0.40–1.20)
GFR: 59.94 mL/min — ABNORMAL LOW (ref >60.00)
Glucose, Bld: 98 mg/dL (ref 70–99)
Potassium: 4.5 mEq/L (ref 3.5–5.1)
Sodium: 139 mEq/L (ref 135–145)
Total Bilirubin: 1 mg/dL (ref 0.2–1.2)
Total Protein: 6.7 g/dL (ref 6.0–8.3)

## 2019-06-16 LAB — LIPID PANEL
Cholesterol: 174 mg/dL (ref 0–200)
HDL: 51.2 mg/dL (ref >39.00)
LDL Cholesterol: 87 mg/dL (ref 0–99)
NonHDL: 122.44
Total CHOL/HDL Ratio: 3
Triglycerides: 178 mg/dL — ABNORMAL HIGH (ref 0.0–149.0)
VLDL: 35.6 mg/dL (ref 0.0–40.0)

## 2019-06-16 LAB — CBC
HCT: 42.5 % (ref 36.0–46.0)
Hemoglobin: 14.1 g/dL (ref 12.0–15.0)
MCHC: 33.2 g/dL (ref 30.0–36.0)
MCV: 91.3 fl (ref 78.0–100.0)
Platelets: 184 10*3/uL (ref 150.0–400.0)
RBC: 4.66 Mil/uL (ref 3.87–5.11)
RDW: 13.5 % (ref 11.5–15.5)
WBC: 6.4 10*3/uL (ref 4.0–10.5)

## 2019-06-16 LAB — T4, FREE: Free T4: 1.2 ng/dL (ref 0.60–1.60)

## 2019-06-16 LAB — TSH: TSH: 0.61 u[IU]/mL (ref 0.35–4.50)

## 2019-06-16 LAB — VITAMIN D 25 HYDROXY (VIT D DEFICIENCY, FRACTURES): VITD: 65.17 ng/mL (ref 30.00–100.00)

## 2019-06-16 MED ORDER — MIRTAZAPINE 15 MG PO TABS: 15.0000 mg | ORAL_TABLET | Freq: Every day | ORAL | 3 refills | Status: DC

## 2019-06-16 MED ORDER — LITHIUM CARBONATE ER 300 MG PO TBCR: 300.0000 mg | EXTENDED_RELEASE_TABLET | Freq: Two times a day (BID) | ORAL | 3 refills | Status: DC

## 2019-06-16 MED ORDER — SIMVASTATIN 10 MG PO TABS: 10.0000 mg | ORAL_TABLET | Freq: Every day | ORAL | 3 refills | Status: DC

## 2019-06-16 NOTE — Progress Notes (Signed)
HPI:  Pt presents to the clinic today for her subsequent annual Medicare Wellness Exam. She is also due to follow up chronic conditions.  Bipolar Depression: Managed on Lithium and Mirtazapine.  She no longer follows with psychiatry.  She denies SI/HI.  HLD: Her last LDL was 112, triglycerides 232, 04/2018.  She denies myalgias on Simvastatin.  She is taking Fish Oil OTC as well.  She tries to consume a low-fat diet.  Osteopenia:  Bone density from 08/2018 reviewed.  She takes Calcium and Vitamin D OTC.  She tries to get 30 minutes of weightbearing exercise daily.  Hypothyroidism: She denies any issues on her current dose of Levothyroxine.  She does follow with endocrinology.  She also reports chronic right hip pain. She describes the pain as sore and achy. The pain does not radiate. She denies back pain, numbness, tingling or weakness of the right lower extremity. She has used [employer] OTC with good relief of symptoms.   Past Medical History:  Diagnosis Date  . Basal cell carcinoma   . Bipolar disorder ([place])   . Depression   . Hypercholesteremia   . Osteopenia   . Thyroid goiter     Current Outpatient Medications  Medication Sig Dispense Refill  . Cholecalciferol (VITAMIN D) 2000 units CAPS Take 1 capsule by mouth daily.    Marland Kitchen levothyroxine (SYNTHROID) 112 MCG tablet TAKE 1 TABLET EVERY DAY 90 tablet 2  . lithium carbonate (LITHOBID) 300 MG CR tablet TAKE 1 TABLET TWICE DAILY 180 tablet 1  . mirtazapine (REMERON) 15 MG tablet TAKE 1 TABLET AT BEDTIME 90 tablet 1  . Multiple Vitamin (MULTIVITAMIN) tablet Take 1 tablet by mouth daily.    . Omega-3 Fatty Acids (FISH OIL) 1200 MG CAPS Take 1,200 mg by mouth 2 (two) times daily.     . simvastatin (ZOCOR) 10 MG tablet TAKE 1 TABLET AT BEDTIME 90 tablet 2   No current facility-administered medications for this visit.    Allergies  Allergen Reactions  . Fluzone [Flu Virus Vaccine] Hives    High dose only---hives, itchy    Family  History  Problem Relation Age of Onset  . Ovarian cancer Mother   . Hypertension Mother   . Heart attack Father   . Hypertension Brother   . Breast cancer Maternal Aunt   . Lung cancer Paternal Aunt   . Breast cancer Paternal Aunt   . Hypertension Other   . Breast cancer Cousin     Social History   Socioeconomic History  . Marital status: Married    Spouse name: Lewis   . Number of children: 3  . Years of education: College  . Highest education level: Not on file  Occupational History  . Occupation: Part-time  Tobacco Use  . Smoking status: Never Smoker  . Smokeless tobacco: Never Used  Substance and Sexual Activity  . Alcohol use: No    Alcohol/week: 0.0 standard drinks  . Drug use: No  . Sexual activity: Never  Other Topics Concern  . Not on file  Social History Narrative  . Not on file   Social Determinants of Health   Financial Resource Strain:   . Difficulty of Paying Living Expenses:   Food Insecurity:   . Worried About Charity fundraiser in the Last Year:   . Arboriculturist in the Last Year:   Transportation Needs:   . Film/video editor (Medical):   Marland Kitchen Lack of Transportation (Non-Medical):   Physical  Activity:   . Days of Exercise per Week:   . Minutes of Exercise per Session:   Stress:   . Feeling of Stress :   Social Connections:   . Frequency of Communication with Friends and Family:   . Frequency of Social Gatherings with Friends and Family:   . Attends Religious Services:   . Active Member of Clubs or Organizations:   . Attends Archivist Meetings:   Marland Kitchen Marital Status:   Intimate Partner Violence:   . Fear of Current or Ex-Partner:   . Emotionally Abused:   Marland Kitchen Physically Abused:   . Sexually Abused:     Hospitiliaztions: None  Health Maintenance:    Flu: 11/2018  Tetanus: 01/2005  Pneumovax: 04/2012  Prevnar: 05/2013  Zostavax: 03/2007  Shingrix: 05/2018, 11/2018  Covid: 04/2019, 05/2019 Moderna  Mammogram: 08/2018  Pap  Smear: 04/2016, no longer screeing  Bone Density: 08/2018  Colon Screening: 03/2009  Eye Doctor: annually   Dental Exam: biannually   Providers:   PCP: Webb Silversmith, NP-C  Endocrinologist: Dr. Honor Junes   I have personally reviewed and have noted:  1. The patient's medical and social history 2. Their use of alcohol, tobacco or illicit drugs 3. Their current medications and supplements 4. The patient's functional ability including ADL's, fall risks, home safety risks and hearing or visual impairment. 5. Diet and physical activities 6. Evidence for depression or mood disorder  Subjective:   Review of Systems:   Constitutional: Denies fever, malaise, fatigue, headache or abrupt weight changes.  HEENT: Denies eye pain, eye redness, ear pain, ringing in the ears, wax buildup, runny nose, nasal congestion, bloody nose, or sore throat. Respiratory: Denies difficulty breathing, shortness of breath, cough or sputum production.   Cardiovascular: Denies chest pain, chest tightness, palpitations or swelling in the hands or feet.  Gastrointestinal: Denies abdominal pain, bloating, constipation, diarrhea or blood in the stool.  GU: Denies urgency, frequency, pain with urination, burning sensation, blood in urine, odor or discharge. Musculoskeletal: Denies decrease in range of motion, difficulty with gait, muscle pain or joint pain and swelling.  Skin: Denies redness, rashes, lesions or ulcercations.  Neurological: Denies dizziness, difficulty with memory, difficulty with speech or problems with balance and coordination.  Psych: Pt has a history of depression. Denies anxiety, SI/HI.  No other specific complaints in a complete review of systems (except as listed in HPI above).  Objective:  PE:   BP 114/76 (BP Location: Left Arm, Patient Position: Sitting, Cuff Size: Normal)   Pulse 67   Temp 98.2 F (36.8 C)   Ht 5' 6.5" (1.689 m)   Wt 166 lb (75.3 kg)   SpO2 98%   BMI 26.39 kg/m    Wt Readings from Last 3 Encounters:  05/11/18 166 lb (75.3 kg)  04/21/18 166 lb 8 oz (75.5 kg)  05/07/17 167 lb (75.8 kg)    General: Appears her stated age, well developed, well nourished in NAD. Skin: Warm, dry and intact. No rashes noted. HEENT: Head: normal shape and size; Eyes: sclera white, no icterus, conjunctiva pink, PERRLA and EOMs intact;  Neck: Neck supple, trachea midline. No masses, lumps present.  Cardiovascular: Normal rate and rhythm. S1,S2 noted.  No murmur, rubs or gallops noted. No JVD or BLE edema. No carotid bruits noted. Pulmonary/Chest: Normal effort and positive vesicular breath sounds. No respiratory distress. No wheezes, rales or ronchi noted.  Abdomen: Soft and nontender. Normal bowel sounds. No distention or masses noted. Liver, spleen and  kidneys non palpable. Musculoskeletal: Pain with abduction and internal rotation of the right hip. Normal abduction and external rotation of the right hip. Strength 5/5 BUE/BLE. No difficulty with gait.  Neurological: Alert and oriented. Cranial nerves II-XII grossly intact. Coordination normal.  Psychiatric: Mood and affect normal. Behavior is normal. Judgment and thought content normal.     BMET    Component Value Date/Time   NA 135 06/10/2018 1602   NA 142 05/02/2015 1103   K 4.5 06/10/2018 1602   CL 98 06/10/2018 1602   CO2 30 06/10/2018 1602   GLUCOSE 93 06/10/2018 1602   BUN 18 06/10/2018 1602   BUN 21 05/02/2015 1103   CREATININE 0.98 06/10/2018 1602   CALCIUM 9.4 06/10/2018 1602   GFRNONAA 66 05/02/2015 1103   GFRAA 76 05/02/2015 1103    Lipid Panel     Component Value Date/Time   CHOL 194 05/11/2018 0938   CHOL 254 (H) 05/02/2015 1103   TRIG 232.0 (H) 05/11/2018 0938   HDL 55.70 05/11/2018 0938   HDL 62 05/02/2015 1103   CHOLHDL 3 05/11/2018 0938   VLDL 46.4 (H) 05/11/2018 0938   LDLCALC 79 05/07/2017 0957   LDLCALC 160 (H) 05/02/2015 1103    CBC    Component Value Date/Time   WBC 7.3  05/11/2018 0938   RBC 5.02 05/11/2018 0938   HGB 14.8 05/11/2018 0938   HGB 14.9 05/02/2015 1103   HCT 45.1 05/11/2018 0938   HCT 44.7 05/02/2015 1103   PLT 233.0 05/11/2018 0938   PLT 209 05/02/2015 1103   MCV 89.8 05/11/2018 0938   MCV 90 05/02/2015 1103   MCH 30.1 05/02/2015 1103   MCHC 32.9 05/11/2018 0938   RDW 13.9 05/11/2018 0938   RDW 13.7 05/02/2015 1103   LYMPHSABS 2.5 05/02/2015 1103   EOSABS 0.1 05/02/2015 1103   BASOSABS 0.0 05/02/2015 1103    Hgb A1C No results found for: HGBA1C    Assessment and Plan:   Medicare Annual Wellness Visit:  Diet: She does eat some lean meats. She consumes fruits and veggies daily. She tries to avoid fried foods. She drinks mostly water, sweet tea. Physical activity: 30 minutes walking daily. Depression/mood screen: Negative, PHQ 9 score of 0 Hearing: Intact to whispered voice Visual acuity: Grossly normal, performs annual eye exam  ADLs: Capable Fall risk: None Home safety: Good Cognitive evaluation: Intact to orientation, naming, recall and repetition EOL planning: Adv directives, full code/ I agree  Preventative Medicine: Encouraged her to get a flu shot in the fall. She declines tetanus for financial reasons. Pneumovax, prevnar, zostovax, shingrix and covid vaccines UTD. She has her mammogram schedule. She no longer needs cervical cancer screening. She is interested in Cologuard- ordered. Bone density UTD. Encouraged her to consume a balanced diet and exercise regimen. Advised her to see an eye doctor and dentist annually. Will check CBC, CMET, Lipid, TSH, Free T4, and Vit D today. Due dates for screening exam given to pt as part of her AVS.  Chronic Right Hip Pain:  Xray right hip today Continue [employer]  Next appointment: 1 year, Medicare Wellness Exam.   Webb Silversmith, NP  This visit occurred during the SARS-CoV-2 public health emergency.  Safety protocols were in place, including screening questions prior to the  visit, additional usage of staff PPE, and extensive cleaning of exam room while observing appropriate contact time as indicated for disinfecting solutions.

## 2019-06-16 NOTE — Patient Instructions (Signed)

## 2019-06-16 NOTE — Assessment & Plan Note (Signed)
TSH and Free T4 today Continue Levothyroxine She will continue to follow with endocrinology

## 2019-06-16 NOTE — Assessment & Plan Note (Signed)
Continue Lithium and Mirtazapine CMET and Lithium level today Will monitor

## 2019-06-16 NOTE — Assessment & Plan Note (Signed)
Continue Calcium and Vit D OTC Encouraged daily weight bearing exercise

## 2019-06-16 NOTE — Assessment & Plan Note (Signed)
CMET and Lipid profile today Encouraged her to consume a low fat diet Continue Simvastatin for now

## 2019-06-17 LAB — LITHIUM LEVEL: Lithium Lvl: 0.5 mmol/L — ABNORMAL LOW (ref 0.6–1.2)

## 2019-06-17 NOTE — Addendum Note (Signed)
Addended by: Jearld Fenton on: 06/17/2019 03:39 PM   Modules accepted: Orders

## 2019-06-20 ENCOUNTER — Telehealth: Payer: Self-pay

## 2019-06-20 NOTE — Telephone Encounter (Signed)
[facility] Night - Client Nonclinical Telephone Record  [facility] Client [facility] Night - Client Client Site [place] - Night Contact Type Call Who Is Calling Patient / Member / Family / Caregiver Caller Name [place] Phone Number [phone] Call Type Message Only Information Provided Reason for Call Returning a Call from the Office Initial Comment Caller needs to speak with Felicia Ross at the office regarding her insurance and orthopedic surgeon who does take her insurance. Additional Comment Disp. Time Disposition Final User 06/16/2019 5:50:33 PM General Information Provided Yes Renato Shin Call Closed By: Renato Shin Transaction Date/Time: 06/16/2019 5:47:37 PM (ET)

## 2019-06-21 ENCOUNTER — Telehealth: Payer: Self-pay | Admitting: Internal Medicine

## 2019-06-21 NOTE — Telephone Encounter (Signed)
Patient called the office today She is requesting Webb Silversmith to call in a home colonoscopy test kit.  Patient stated she would like the request to be sent to [facility]   Please advise

## 2019-06-22 NOTE — Addendum Note (Signed)
Addended by: Lurlean Nanny on: 06/22/2019 10:19 AM   Modules accepted: Orders

## 2019-06-22 NOTE — Telephone Encounter (Signed)
The screening is not through [facility], but it has been ordered

## 2019-07-01 NOTE — Telephone Encounter (Signed)
Patient called back about Home Colonoscopy test kit  Patient stated if its not through [facility] where can she pick this up from ?   Requesting call back

## 2019-07-01 NOTE — Telephone Encounter (Signed)
Pt expressed understanding that this test will be sent via mail, not from [facility] and there is not anything that she needs to pick up

## 2019-07-02 ENCOUNTER — Ambulatory Visit: Payer: Medicare HMO

## 2019-07-05 ENCOUNTER — Ambulatory Visit: Payer: Medicare HMO

## 2019-07-11 DIAGNOSIS — Z1211 Encounter for screening for malignant neoplasm of colon: Secondary | ICD-10-CM | POA: Diagnosis not present

## 2019-07-11 LAB — COLOGUARD

## 2019-07-12 ENCOUNTER — Ambulatory Visit
Admission: RE | Admit: 2019-07-12 | Discharge: 2019-07-12 | Disposition: A | Discharge status: Home / Self Care | Payer: Medicare HMO | Source: Ambulatory Visit | Attending: Internal Medicine | Admitting: Internal Medicine

## 2019-07-12 ENCOUNTER — Other Ambulatory Visit: Payer: Self-pay

## 2019-07-12 DIAGNOSIS — M25551 Pain in right hip: Secondary | ICD-10-CM | POA: Diagnosis not present

## 2019-07-12 DIAGNOSIS — M1611 Unilateral primary osteoarthritis, right hip: Secondary | ICD-10-CM | POA: Diagnosis not present

## 2019-07-15 ENCOUNTER — Telehealth: Payer: Self-pay | Admitting: Internal Medicine

## 2019-07-15 ENCOUNTER — Other Ambulatory Visit: Payer: Self-pay | Admitting: Internal Medicine

## 2019-07-15 DIAGNOSIS — M24151 Other articular cartilage disorders, right hip: Secondary | ICD-10-CM

## 2019-07-15 DIAGNOSIS — M1611 Unilateral primary osteoarthritis, right hip: Secondary | ICD-10-CM

## 2019-07-15 NOTE — Telephone Encounter (Signed)
Patient called today, She would like her MRI results mailed to her  Confirmed address on chart is correct.

## 2019-07-18 LAB — COLOGUARD: COLOGUARD: POSITIVE — AB

## 2019-07-18 NOTE — Telephone Encounter (Signed)
Mailed

## 2019-07-19 ENCOUNTER — Other Ambulatory Visit: Payer: Self-pay | Admitting: Internal Medicine

## 2019-07-19 ENCOUNTER — Telehealth: Payer: Self-pay

## 2019-07-19 DIAGNOSIS — R195 Other fecal abnormalities: Secondary | ICD-10-CM

## 2019-07-19 NOTE — Telephone Encounter (Signed)
Received fax today from [facility] regarding Cologuard results  Cologuard (DNA stool test) result:  POSITVE  Referral has been placed for Colonoscopy

## 2019-07-19 NOTE — Telephone Encounter (Signed)
Left message on voicemail.

## 2019-07-20 DIAGNOSIS — D225 Melanocytic nevi of trunk: Secondary | ICD-10-CM | POA: Diagnosis not present

## 2019-07-20 DIAGNOSIS — L608 Other nail disorders: Secondary | ICD-10-CM | POA: Diagnosis not present

## 2019-07-20 DIAGNOSIS — Z08 Encounter for follow-up examination after completed treatment for malignant neoplasm: Secondary | ICD-10-CM | POA: Diagnosis not present

## 2019-07-20 DIAGNOSIS — L821 Other seborrheic keratosis: Secondary | ICD-10-CM | POA: Diagnosis not present

## 2019-07-20 DIAGNOSIS — Z85828 Personal history of other malignant neoplasm of skin: Secondary | ICD-10-CM | POA: Diagnosis not present

## 2019-07-20 NOTE — Telephone Encounter (Signed)
Spoke to pt and she is aware of results

## 2019-07-21 DIAGNOSIS — M25551 Pain in right hip: Secondary | ICD-10-CM | POA: Diagnosis not present

## 2019-07-25 ENCOUNTER — Telehealth (INDEPENDENT_AMBULATORY_CARE_PROVIDER_SITE_OTHER): Payer: Self-pay | Admitting: Gastroenterology

## 2019-07-25 DIAGNOSIS — R195 Other fecal abnormalities: Secondary | ICD-10-CM

## 2019-07-25 DIAGNOSIS — M25551 Pain in right hip: Secondary | ICD-10-CM | POA: Diagnosis not present

## 2019-07-25 NOTE — Progress Notes (Signed)
Gastroenterology Pre-Procedure Review  Request Date: Tuesday 08/09/19 Requesting Physician: Dr. Vicente Males  PATIENT REVIEW QUESTIONS: The patient responded to the following health history questions as indicated:    1. Are you having any GI issues? Positive Cologuard.  Last colonoscopy was 10 years ago and it was normal. 2. Do you have a personal history of Polyps? no 3. Do you have a family history of Colon Cancer or Polyps? no 4. Diabetes Mellitus? no 5. Joint replacements in the past 12 months?no 6. Major health problems in the past 3 months?no 7. Any artificial heart valves, MVP, or defibrillator?no    MEDICATIONS & ALLERGIES:    Patient reports the following regarding taking any anticoagulation/antiplatelet therapy:   Plavix, Coumadin, Eliquis, Xarelto, Lovenox, Pradaxa, Brilinta, or Effient? no Aspirin? no  Patient confirms/reports the following medications:  Current Outpatient Medications  Medication Sig Dispense Refill  . Cholecalciferol (VITAMIN D) 2000 units CAPS Take 1 capsule by mouth daily.    Marland Kitchen levothyroxine (SYNTHROID) 112 MCG tablet TAKE 1 TABLET EVERY DAY 90 tablet 2  . lithium carbonate (LITHOBID) 300 MG CR tablet Take 1 tablet (300 mg total) by mouth 2 (two) times daily. 180 tablet 3  . mirtazapine (REMERON) 15 MG tablet Take 1 tablet (15 mg total) by mouth at bedtime. 90 tablet 3  . Multiple Vitamin (MULTIVITAMIN) tablet Take 1 tablet by mouth daily.    . Omega-3 Fatty Acids (FISH OIL) 1200 MG CAPS Take 1,200 mg by mouth 2 (two) times daily.     . simvastatin (ZOCOR) 10 MG tablet Take 1 tablet (10 mg total) by mouth at bedtime. 90 tablet 3   No current facility-administered medications for this visit.    Patient confirms/reports the following allergies:  Allergies  Allergen Reactions  . Fluzone [Flu Virus Vaccine] Hives    High dose only---hives, itchy    Orders Placed This Encounter  Procedures  . Procedural/ Surgical Case Request: COLONOSCOPY WITH PROPOFOL    Standing Status:   Standing    Number of Occurrences:   1    Order Specific Question:   Pre-op diagnosis    Answer:   positive cologuard    Order Specific Question:   CPT Code    Answer:   [id number]    AUTHORIZATION INFORMATION Primary Insurance: 1D#: Group #:  Secondary Insurance: 1D#: Group #:  SCHEDULE INFORMATION: Date: Tuesday 08/09/19 Time: Location:[facility]

## 2019-07-26 ENCOUNTER — Telehealth: Payer: Self-pay

## 2019-07-26 NOTE — Telephone Encounter (Signed)
Returned patients call.  LVM to answer her question-"Where to go for COVID Test?"  Patient was advised to go for her COVID test on 08/05/19 between the hours of 8am-1pm at [facility].  Thanks,  [place], [place]

## 2019-07-27 DIAGNOSIS — M25551 Pain in right hip: Secondary | ICD-10-CM | POA: Diagnosis not present

## 2019-08-01 DIAGNOSIS — M25551 Pain in right hip: Secondary | ICD-10-CM | POA: Diagnosis not present

## 2019-08-03 DIAGNOSIS — M25551 Pain in right hip: Secondary | ICD-10-CM | POA: Diagnosis not present

## 2019-08-05 ENCOUNTER — Other Ambulatory Visit
Admission: RE | Admit: 2019-08-05 | Discharge: 2019-08-05 | Disposition: A | Discharge status: Home / Self Care | Payer: Medicare HMO | Source: Ambulatory Visit | Attending: Gastroenterology | Admitting: Gastroenterology

## 2019-08-05 DIAGNOSIS — Z20822 Contact with and (suspected) exposure to covid-19: Secondary | ICD-10-CM | POA: Diagnosis not present

## 2019-08-05 DIAGNOSIS — Z01812 Encounter for preprocedural laboratory examination: Secondary | ICD-10-CM | POA: Diagnosis not present

## 2019-08-05 LAB — SARS CORONAVIRUS 2 (TAT 6-24 HRS): SARS Coronavirus 2: NEGATIVE

## 2019-08-09 ENCOUNTER — Encounter
Admission: RE | Disposition: A | Discharge status: Home / Self Care | Payer: Self-pay | Source: Home / Self Care | Attending: Gastroenterology

## 2019-08-09 ENCOUNTER — Other Ambulatory Visit: Payer: Self-pay

## 2019-08-09 ENCOUNTER — Ambulatory Visit: Payer: Medicare HMO | Admitting: Anesthesiology

## 2019-08-09 ENCOUNTER — Ambulatory Visit
Admission: RE | Admit: 2019-08-09 | Discharge: 2019-08-09 | Disposition: A | Discharge status: Home / Self Care | Payer: Medicare HMO | Attending: Gastroenterology | Admitting: Gastroenterology

## 2019-08-09 ENCOUNTER — Encounter: Payer: Self-pay | Admitting: Gastroenterology

## 2019-08-09 DIAGNOSIS — D122 Benign neoplasm of ascending colon: Secondary | ICD-10-CM | POA: Diagnosis not present

## 2019-08-09 DIAGNOSIS — D126 Benign neoplasm of colon, unspecified: Secondary | ICD-10-CM

## 2019-08-09 DIAGNOSIS — F319 Bipolar disorder, unspecified: Secondary | ICD-10-CM | POA: Diagnosis not present

## 2019-08-09 DIAGNOSIS — M8580 Other specified disorders of bone density and structure, unspecified site: Secondary | ICD-10-CM | POA: Diagnosis not present

## 2019-08-09 DIAGNOSIS — Z79899 Other long term (current) drug therapy: Secondary | ICD-10-CM | POA: Insufficient documentation

## 2019-08-09 DIAGNOSIS — Z887 Allergy status to serum and vaccine status: Secondary | ICD-10-CM | POA: Insufficient documentation

## 2019-08-09 DIAGNOSIS — Z8041 Family history of malignant neoplasm of ovary: Secondary | ICD-10-CM | POA: Insufficient documentation

## 2019-08-09 DIAGNOSIS — Z85828 Personal history of other malignant neoplasm of skin: Secondary | ICD-10-CM | POA: Insufficient documentation

## 2019-08-09 DIAGNOSIS — D124 Benign neoplasm of descending colon: Secondary | ICD-10-CM | POA: Diagnosis not present

## 2019-08-09 DIAGNOSIS — Z8249 Family history of ischemic heart disease and other diseases of the circulatory system: Secondary | ICD-10-CM | POA: Diagnosis not present

## 2019-08-09 DIAGNOSIS — K5790 Diverticulosis of intestine, part unspecified, without perforation or abscess without bleeding: Secondary | ICD-10-CM | POA: Diagnosis not present

## 2019-08-09 DIAGNOSIS — Z801 Family history of malignant neoplasm of trachea, bronchus and lung: Secondary | ICD-10-CM | POA: Diagnosis not present

## 2019-08-09 DIAGNOSIS — E039 Hypothyroidism, unspecified: Secondary | ICD-10-CM | POA: Diagnosis not present

## 2019-08-09 DIAGNOSIS — R195 Other fecal abnormalities: Secondary | ICD-10-CM

## 2019-08-09 DIAGNOSIS — Z803 Family history of malignant neoplasm of breast: Secondary | ICD-10-CM | POA: Diagnosis not present

## 2019-08-09 DIAGNOSIS — K635 Polyp of colon: Secondary | ICD-10-CM | POA: Diagnosis not present

## 2019-08-09 DIAGNOSIS — E7800 Pure hypercholesterolemia, unspecified: Secondary | ICD-10-CM | POA: Diagnosis not present

## 2019-08-09 DIAGNOSIS — K5730 Diverticulosis of large intestine without perforation or abscess without bleeding: Secondary | ICD-10-CM | POA: Insufficient documentation

## 2019-08-09 HISTORY — PX: COLONOSCOPY WITH PROPOFOL: SHX5780

## 2019-08-09 SURGERY — COLONOSCOPY WITH PROPOFOL
Anesthesia: General

## 2019-08-09 MED ORDER — SODIUM CHLORIDE 0.9 % IV SOLN: INTRAVENOUS | Status: DC

## 2019-08-09 MED ORDER — PROPOFOL 500 MG/50ML IV EMUL
INTRAVENOUS | Status: DC | PRN
  Administered 2019-08-09: 120 ug/kg/min via INTRAVENOUS

## 2019-08-09 MED ORDER — PROPOFOL 500 MG/50ML IV EMUL
INTRAVENOUS | Status: AC
  Filled 2019-08-09: qty 50

## 2019-08-09 NOTE — Transfer of Care (Signed)
Immediate Anesthesia Transfer of Care Note  Patient: Felicia Ross  Procedure(s) Performed: COLONOSCOPY WITH PROPOFOL (N/A )  Patient Location: PACU  Anesthesia Type:General  Level of Consciousness: awake and sedated  Airway & Oxygen Therapy: Patient Spontanous Breathing and Patient connected to nasal cannula oxygen  Post-op Assessment: Report given to RN and Post -op Vital signs reviewed and stable  Post vital signs: Reviewed and stable  Last Vitals:  Vitals Value Taken Time  BP    Temp    Pulse    Resp    SpO2      Last Pain:  Vitals:   08/09/19 0724  TempSrc: Temporal  PainSc: 0-No pain         Complications: No apparent anesthesia complications

## 2019-08-09 NOTE — H&P (Signed)
Felicia Bellows, MD [address], [place], [place], [place], [address] [address], [place], [place], [place], [address] Phone: [phone]  Fax: [phone]  Primary Care Physician:  Jearld Fenton, NP   Pre-Procedure History & Physical: HPI:  Felicia Ross is a 74 y.o. female is here for an colonoscopy.   Past Medical History:  Diagnosis Date  . Basal cell carcinoma   . Bipolar disorder ([place])   . Depression   . Hypercholesteremia   . Osteopenia   . Thyroid goiter     Past Surgical History:  Procedure Laterality Date  . THYROID SURGERY  2010  . TUBAL LIGATION      Prior to Admission medications   Medication Sig Start Date End Date Taking? Authorizing Provider  Cholecalciferol (VITAMIN D) 2000 units CAPS Take 1 capsule by mouth daily.   Yes [provider]  levothyroxine (SYNTHROID) 112 MCG tablet TAKE 1 TABLET EVERY DAY 08/13/18  Yes Baity, Coralie Keens, NP  lithium carbonate (LITHOBID) 300 MG CR tablet Take 1 tablet (300 mg total) by mouth 2 (two) times daily. 06/16/19  Yes Jearld Fenton, NP  mirtazapine (REMERON) 15 MG tablet Take 1 tablet (15 mg total) by mouth at bedtime. 06/16/19  Yes Jearld Fenton, NP  Multiple Vitamin (MULTIVITAMIN) tablet Take 1 tablet by mouth daily.   Yes [provider]  Omega-3 Fatty Acids (FISH OIL) 1200 MG CAPS Take 1,200 mg by mouth 2 (two) times daily.    Yes [provider]  simvastatin (ZOCOR) 10 MG tablet Take 1 tablet (10 mg total) by mouth at bedtime. 06/16/19  Yes Jearld Fenton, NP    Allergies as of 07/25/2019 - Review Complete 07/25/2019  Allergen Reaction Noted  . Fluzone [flu virus vaccine] Hives 01/29/2016    Family History  Problem Relation Age of Onset  . Ovarian cancer Mother   . Hypertension Mother   . Heart attack Father   . Hypertension Brother   . Breast cancer Maternal Aunt   . Lung cancer Paternal Aunt   . Breast cancer Paternal Aunt   . Hypertension Other   . Breast cancer  Cousin     Social History   Socioeconomic History  . Marital status: Married    Spouse name: Lewis   . Number of children: 3  . Years of education: College  . Highest education level: Not on file  Occupational History  . Occupation: Part-time  Tobacco Use  . Smoking status: Never Smoker  . Smokeless tobacco: Never Used  Substance and Sexual Activity  . Alcohol use: No    Alcohol/week: 0.0 standard drinks  . Drug use: No  . Sexual activity: Never  Other Topics Concern  . Not on file  Social History Narrative  . Not on file   Social Determinants of Health   Financial Resource Strain:   . Difficulty of Paying Living Expenses:   Food Insecurity:   . Worried About Charity fundraiser in the Last Year:   . Arboriculturist in the Last Year:   Transportation Needs:   . Film/video editor (Medical):   Marland Kitchen Lack of Transportation (Non-Medical):   Physical Activity:   . Days of Exercise per Week:   . Minutes of Exercise per Session:   Stress:   . Feeling of Stress :   Social Connections:   . Frequency of Communication with Friends and Family:   . Frequency of Social Gatherings  with Friends and Family:   . Attends Religious Services:   . Active Member of Clubs or Organizations:   . Attends Archivist Meetings:   Marland Kitchen Marital Status:   Intimate Partner Violence:   . Fear of Current or Ex-Partner:   . Emotionally Abused:   Marland Kitchen Physically Abused:   . Sexually Abused:     Review of Systems: See HPI, otherwise negative ROS  Physical Exam: BP 138/62   Pulse 62   Temp 98.1 F (36.7 C) (Temporal)   Resp 16   Ht 5\' 7"  (1.702 m)   Wt 71.4 kg   SpO2 100%   BMI 24.64 kg/m  General:   Alert,  pleasant and cooperative in NAD Head:  Normocephalic and atraumatic. Neck:  Supple; no masses or thyromegaly. Lungs:  Clear throughout to auscultation, normal respiratory effort.    Heart:  +S1, +S2, Regular rate and rhythm, No edema. Abdomen:  Soft, nontender and  nondistended. Normal bowel sounds, without guarding, and without rebound.   Neurologic:  Alert and  oriented x4;  grossly normal neurologically.  Impression/Plan: Felicia Ross is here for an colonoscopy to be performed for Screening colonoscopy cologyuard positive Risks, benefits, limitations, and alternatives regarding  colonoscopy have been reviewed with the patient.  Questions have been answered.  All parties agreeable.   Felicia Bellows, MD  08/09/2019, 8:17 AM

## 2019-08-09 NOTE — Anesthesia Postprocedure Evaluation (Signed)
Anesthesia Post Note  Patient: Felicia Ross  Procedure(s) Performed: COLONOSCOPY WITH PROPOFOL (N/A )  Patient location during evaluation: Endoscopy Anesthesia Type: General Level of consciousness: awake and alert Pain management: pain level controlled Vital Signs Assessment: post-procedure vital signs reviewed and stable Respiratory status: spontaneous breathing, nonlabored ventilation, respiratory function stable and patient connected to nasal cannula oxygen Cardiovascular status: blood pressure returned to baseline and stable Postop Assessment: no apparent nausea or vomiting Anesthetic complications: no     Last Vitals:  Vitals:   08/09/19 0909 08/09/19 0919  BP: (!) 118/54 129/60  Pulse:    Resp:    Temp:    SpO2:      Last Pain:  Vitals:   08/09/19 0919  TempSrc:   PainSc: 0-No pain                 Martha Clan

## 2019-08-09 NOTE — Anesthesia Procedure Notes (Signed)
Performed by: Cook-Martin, Shalece Staffa Pre-anesthesia Checklist: Patient identified, Emergency Drugs available, Suction available, Patient being monitored and Timeout performed Patient Re-evaluated:Patient Re-evaluated prior to induction Oxygen Delivery Method: Nasal cannula Preoxygenation: Pre-oxygenation with 100% oxygen Induction Type: IV induction Placement Confirmation: positive ETCO2 and CO2 detector       

## 2019-08-09 NOTE — Op Note (Signed)
[facility] Gastroenterology Patient Name: Felicia Ross Procedure Date: 08/09/2019 7:54 AM MRN: [id number] Account #: [id number] Date of Birth: [birth year] Admit Type: Outpatient Age: 74 Room: [facility] ENDO ROOM 1 Gender: Female Note Status: Finalized Procedure:             Colonoscopy Indications:           Positive Cologuard test Providers:             Jonathon Bellows MD, MD Referring MD:          Jearld Fenton (Referring MD) Medicines:             Monitored Anesthesia Care Complications:         No immediate complications. Procedure:             Pre-Anesthesia Assessment:                        - Prior to the procedure, a History and Physical was                         performed, and patient medications, allergies and                         sensitivities were reviewed. The patient's tolerance                         of previous anesthesia was reviewed.                        - The risks and benefits of the procedure and the                         sedation options and risks were discussed with the                         patient. All questions were answered and informed                         consent was obtained.                        - ASA Grade Assessment: II - A patient with mild                         systemic disease.                        After obtaining informed consent, the colonoscope was                         passed under direct vision. Throughout the procedure,                         the patient's blood pressure, pulse, and oxygen                         saturations were monitored continuously. The                         Colonoscope was introduced through the anus and  advanced to the the cecum, identified by the                         appendiceal orifice. The colonoscopy was technically                         difficult and complex due to significant looping.                         Successful completion of the  procedure was aided by                         withdrawing the scope and replacing with the pediatric                         colonoscope. Findings:      The perianal and digital rectal examinations were normal.      Three sessile polyps were found in the ascending colon. The polyps were       5 to 8 mm in size. These polyps were removed with a cold snare.       Resection and retrieval were complete.      A 8 mm polyp was found in the descending colon. The polyp was sessile.       The polyp was removed with a cold snare. Resection and retrieval were       complete.      Multiple small-mouthed diverticula were found in the sigmoid colon.      The exam was otherwise without abnormality on direct and retroflexion       views. Impression:            - Three 5 to 8 mm polyps in the ascending colon,                         removed with a cold snare. Resected and retrieved.                        - One 8 mm polyp in the descending colon, removed with                         a cold snare. Resected and retrieved.                        - Diverticulosis in the sigmoid colon.                        - The examination was otherwise normal on direct and                         retroflexion views. Recommendation:        - Discharge patient to home (with escort).                        - Resume previous diet.                        - Continue present medications.                        -  Await pathology results.                        - Repeat colonoscopy for surveillance based on                         pathology results. Procedure Code(s):     --- Professional ---                        [phone], Colonoscopy, flexible; with removal of                         tumor(s), polyp(s), or other lesion(s) by snare                         technique Diagnosis Code(s):     --- Professional ---                        K63.5, Polyp of colon                        R19.5, Other fecal abnormalities                         K57.30, Diverticulosis of large intestine without                         perforation or abscess without bleeding CPT copyright 2019 American Medical Association. All rights reserved. The codes documented in this report are preliminary and upon coder review may  be revised to meet current compliance requirements. Jonathon Bellows, MD Jonathon Bellows MD, MD 08/09/2019 8:49:04 AM This report has been signed electronically. Number of Addenda: 0 Note Initiated On: 08/09/2019 7:54 AM Scope Withdrawal Time: 0 hours 10 minutes 9 seconds  Total Procedure Duration: 0 hours 27 minutes 57 seconds  Estimated Blood Loss:  Estimated blood loss: none.      [facility]

## 2019-08-09 NOTE — Anesthesia Preprocedure Evaluation (Signed)
Anesthesia Evaluation  Patient identified by MRN, date of birth, ID band Patient awake    Reviewed: Allergy & Precautions, H&P , NPO status , Patient's Chart, lab work & pertinent test results, reviewed documented beta blocker date and time   History of Anesthesia Complications Negative for: history of anesthetic complications  Airway Mallampati: I  TM Distance: >3 FB Neck ROM: full    Dental  (+) Dental Advidsory Given, Caps, Teeth Intact   Pulmonary neg pulmonary ROS,    Pulmonary exam normal breath sounds clear to auscultation       Cardiovascular Exercise Tolerance: Good negative cardio ROS Normal cardiovascular exam Rhythm:regular Rate:Normal     Neuro/Psych PSYCHIATRIC DISORDERS Depression Bipolar Disorder negative neurological ROS     GI/Hepatic negative GI ROS, Neg liver ROS,   Endo/Other  neg diabetesHypothyroidism   Renal/GU negative Renal ROS  negative genitourinary   Musculoskeletal   Abdominal   Peds  Hematology negative hematology ROS (+)   Anesthesia Other Findings Past Medical History: No date: Basal cell carcinoma No date: Bipolar disorder (HCC) No date: Depression No date: Hypercholesteremia No date: Osteopenia No date: Thyroid goiter   Reproductive/Obstetrics negative OB ROS                             Anesthesia Physical Anesthesia Plan  ASA: II  Anesthesia Plan: General   Post-op Pain Management:    Induction: Intravenous  PONV Risk Score and Plan: 3 and Propofol infusion and TIVA  Airway Management Planned: Natural Airway and Nasal Cannula  Additional Equipment:   Intra-op Plan:   Post-operative Plan:   Informed Consent: I have reviewed the patients History and Physical, chart, labs and discussed the procedure including the risks, benefits and alternatives for the proposed anesthesia with the patient or authorized representative who has indicated  his/her understanding and acceptance.     Dental Advisory Given  Plan Discussed with: Anesthesiologist, CRNA and Surgeon  Anesthesia Plan Comments:         Anesthesia Quick Evaluation

## 2019-08-10 ENCOUNTER — Encounter: Payer: Self-pay | Admitting: *Deleted

## 2019-08-10 ENCOUNTER — Encounter: Payer: Self-pay | Admitting: Gastroenterology

## 2019-08-10 LAB — SURGICAL PATHOLOGY

## 2019-08-16 DIAGNOSIS — M25551 Pain in right hip: Secondary | ICD-10-CM | POA: Diagnosis not present

## 2019-08-17 DIAGNOSIS — M25551 Pain in right hip: Secondary | ICD-10-CM | POA: Diagnosis not present

## 2019-08-31 ENCOUNTER — Ambulatory Visit
Admission: RE | Admit: 2019-08-31 | Discharge: 2019-08-31 | Disposition: A | Discharge status: Home / Self Care | Payer: Medicare HMO | Source: Ambulatory Visit | Attending: Internal Medicine | Admitting: Internal Medicine

## 2019-08-31 DIAGNOSIS — Z1231 Encounter for screening mammogram for malignant neoplasm of breast: Secondary | ICD-10-CM | POA: Diagnosis not present

## 2019-09-09 ENCOUNTER — Other Ambulatory Visit: Payer: Self-pay

## 2019-09-09 ENCOUNTER — Ambulatory Visit: Payer: Medicare HMO | Admitting: Family Medicine

## 2019-09-09 ENCOUNTER — Encounter: Payer: Self-pay | Admitting: Family Medicine

## 2019-09-09 VITALS — BP 140/80 | HR 68 | Temp 97.3°F | Ht 66.5 in | Wt 164.5 lb

## 2019-09-09 DIAGNOSIS — R350 Frequency of micturition: Secondary | ICD-10-CM

## 2019-09-09 LAB — POC URINALSYSI DIPSTICK (AUTOMATED)
Bilirubin, UA: NEGATIVE
Glucose, UA: NEGATIVE
Ketones, UA: NEGATIVE
Leukocytes, UA: NEGATIVE
Nitrite, UA: NEGATIVE
Protein, UA: NEGATIVE
Spec Grav, UA: 1.01 (ref 1.010–1.025)
Urobilinogen, UA: 0.2 E.U./dL
pH, UA: 7 (ref 5.0–8.0)

## 2019-09-09 MED ORDER — SULFAMETHOXAZOLE-TRIMETHOPRIM 800-160 MG PO TABS
1.0000 | ORAL_TABLET | Freq: Two times a day (BID) | ORAL | 0 refills | Status: AC
Start: 2019-09-09 — End: 2019-09-12

## 2019-09-09 NOTE — Progress Notes (Signed)
Felicia Ross T. Jennalee Greaves, MD, [name] [place], [address]  Phone: [phone]  FAX: [place] - 74 y.o. female  MRN [id number]  Date of Birth: [birth year]  Date: 09/09/2019  PCP: Felicia Fenton, NP  Referral: Felicia Fenton, NP  Chief Complaint  Patient presents with  . Urinary Frequency    This visit occurred during the SARS-CoV-2 public health emergency.  Safety protocols were in place, including screening questions prior to the visit, additional usage of staff PPE, and extensive cleaning of exam room while observing appropriate contact time as indicated for disinfecting solutions.   Subjective:   This 74 y.o. female patient presents with burning, urgency. No vaginal discharge or external irritation.  No STD exposure. No abd pain, no flank pain.  Abn smell and frequently  No pain Had a UTI a year ago  Hypogastric pain  Review of Systems is noted in the HPI, as appropriate  Objective:   Blood pressure 140/80, pulse 68, temperature (!) 97.3 F (36.3 C), temperature source Temporal, height 5' 6.5" (1.689 m), weight 164 lb 8 oz (74.6 kg), SpO2 97 %.   GEN: WDWN HEENT: Atraumatc, normocephalic. CV: RRR, No M/G/R PULM: CTA B, No wheezes, crackles, or rhonchi ABD: S, NT, ND, +BS, no rebound. No CVAT. + suprapubic tenderness.  Objective Data: Results for orders placed or performed in visit on 09/09/19  POCT Urinalysis Dipstick (Automated)  Result Value Ref Range   Color, UA Yellow    Clarity, UA Clear    Glucose, UA Negative Negative   Bilirubin, UA Negative    Ketones, UA Negative    Spec Grav, UA 1.010 1.010 - 1.025   Blood, UA Trace    pH, UA 7.0 5.0 - 8.0   Protein, UA Negative Negative   Urobilinogen, UA 0.2 0.2 or 1.0 E.U./dL   Nitrite, UA Negative    Leukocytes, UA Negative Negative    Assessment and Plan:      ICD-10-CM   1. Urinary frequency  R35.0 POCT Urinalysis Dipstick (Automated)    Urine Culture   Clinically c/w UTI. Check culture  Rx with ABX as below. Drink plenty of fluids and supportive care.  Follow-up: No follow-ups on file.  Meds ordered this encounter  Medications  . sulfamethoxazole-trimethoprim (BACTRIM DS) 800-160 MG tablet    Sig: Take 1 tablet by mouth 2 (two) times daily for 3 days.    Dispense:  6 tablet    Refill:  0   Orders Placed This Encounter  Procedures  . Urine Culture  . POCT Urinalysis Dipstick (Automated)    Signed,  Brylie Sneath T. Layliana Devins, MD   Patient's Medications  New Prescriptions   SULFAMETHOXAZOLE-TRIMETHOPRIM (BACTRIM DS) 800-160 MG TABLET    Take 1 tablet by mouth 2 (two) times daily for 3 days.  Previous Medications   CHOLECALCIFEROL (VITAMIN D) 2000 UNITS CAPS    Take 1 capsule by mouth daily.   LEVOTHYROXINE (SYNTHROID) 112 MCG TABLET    TAKE 1 TABLET EVERY DAY   LITHIUM CARBONATE (LITHOBID) 300 MG CR TABLET    Take 1 tablet (300 mg total) by mouth 2 (two) times daily.   MIRTAZAPINE (REMERON) 15 MG TABLET    Take 1 tablet (15 mg total) by mouth at bedtime.   MULTIPLE VITAMIN (MULTIVITAMIN) TABLET    Take 1 tablet by mouth daily.   OMEGA-3 FATTY  ACIDS (FISH OIL) 1200 MG CAPS    Take 1,200 mg by mouth 2 (two) times daily.    SIMVASTATIN (ZOCOR) 10 MG TABLET    Take 1 tablet (10 mg total) by mouth at bedtime.  Modified Medications   No medications on file  Discontinued Medications   No medications on file

## 2019-09-11 LAB — URINE CULTURE
MICRO NUMBER:: 10635642
SPECIMEN QUALITY:: ADEQUATE

## 2019-09-22 ENCOUNTER — Telehealth: Payer: Self-pay | Admitting: Internal Medicine

## 2019-09-22 DIAGNOSIS — M25551 Pain in right hip: Secondary | ICD-10-CM

## 2019-09-22 DIAGNOSIS — G8929 Other chronic pain: Secondary | ICD-10-CM

## 2019-09-22 NOTE — Telephone Encounter (Signed)
Patient called requesting a referral Asked patient is this is a new issues and may need appointment but  She stated she has been seen before for her hip, had an mri done and has been seen by a specialist but patient is requesting a referral to be seen by Dr Dione Plover. [place], [facility]

## 2019-09-22 NOTE — Telephone Encounter (Signed)
Referral placed.

## 2019-09-22 NOTE — Addendum Note (Signed)
Addended by: Jearld Fenton on: 09/22/2019 11:01 AM   Modules accepted: Orders

## 2019-11-14 DIAGNOSIS — M25551 Pain in right hip: Secondary | ICD-10-CM | POA: Insufficient documentation

## 2019-11-17 DIAGNOSIS — M1611 Unilateral primary osteoarthritis, right hip: Secondary | ICD-10-CM | POA: Diagnosis not present

## 2019-11-17 DIAGNOSIS — M25551 Pain in right hip: Secondary | ICD-10-CM | POA: Diagnosis not present

## 2020-02-15 ENCOUNTER — Other Ambulatory Visit: Payer: Self-pay | Admitting: Internal Medicine

## 2020-02-15 DIAGNOSIS — Z1231 Encounter for screening mammogram for malignant neoplasm of breast: Secondary | ICD-10-CM

## 2020-02-16 DIAGNOSIS — E039 Hypothyroidism, unspecified: Secondary | ICD-10-CM | POA: Diagnosis not present

## 2020-02-23 DIAGNOSIS — E039 Hypothyroidism, unspecified: Secondary | ICD-10-CM | POA: Diagnosis not present

## 2020-02-28 ENCOUNTER — Telehealth: Payer: Self-pay | Admitting: Internal Medicine

## 2020-02-28 DIAGNOSIS — M1631 Unilateral osteoarthritis resulting from hip dysplasia, right hip: Secondary | ICD-10-CM | POA: Insufficient documentation

## 2020-02-28 NOTE — Telephone Encounter (Signed)
Pt called in wanted to know about the clearance letter , they said they faxed a letter to NP. Baity on 12/27/19 and they never received it back , and she needs the letter to have the surgery. And the fax number she can fax it to [phone]

## 2020-03-06 ENCOUNTER — Telehealth: Payer: Self-pay | Admitting: Internal Medicine

## 2020-03-06 ENCOUNTER — Other Ambulatory Visit: Payer: Self-pay

## 2020-03-06 ENCOUNTER — Ambulatory Visit: Payer: Medicare HMO | Admitting: Internal Medicine

## 2020-03-06 VITALS — BP 148/70 | HR 72 | Temp 96.8°F | Ht 67.0 in | Wt 169.0 lb

## 2020-03-06 DIAGNOSIS — Z01818 Encounter for other preprocedural examination: Secondary | ICD-10-CM

## 2020-03-06 DIAGNOSIS — M1611 Unilateral primary osteoarthritis, right hip: Secondary | ICD-10-CM | POA: Diagnosis not present

## 2020-03-06 LAB — COMPREHENSIVE METABOLIC PANEL
ALT: 20 U/L (ref 0–35)
AST: 22 U/L (ref 0–37)
Albumin: 4.5 g/dL (ref 3.5–5.2)
Alkaline Phosphatase: 72 U/L (ref 39–117)
BUN: 22 mg/dL (ref 6–23)
CO2: 33 mEq/L — ABNORMAL HIGH (ref 19–32)
Calcium: 9.7 mg/dL (ref 8.4–10.5)
Chloride: 102 mEq/L (ref 96–112)
Creatinine, Ser: 1 mg/dL (ref 0.40–1.20)
GFR: 56.05 mL/min — ABNORMAL LOW (ref >60.00)
Glucose, Bld: 97 mg/dL (ref 70–99)
Potassium: 4.3 mEq/L (ref 3.5–5.1)
Sodium: 138 mEq/L (ref 135–145)
Total Bilirubin: 0.9 mg/dL (ref 0.2–1.2)
Total Protein: 7.5 g/dL (ref 6.0–8.3)

## 2020-03-06 LAB — CBC
HCT: 43.7 % (ref 36.0–46.0)
Hemoglobin: 14.4 g/dL (ref 12.0–15.0)
MCHC: 33 g/dL (ref 30.0–36.0)
MCV: 91.5 fl (ref 78.0–100.0)
Platelets: 197 10*3/uL (ref 150.0–400.0)
RBC: 4.77 Mil/uL (ref 3.87–5.11)
RDW: 13.8 % (ref 11.5–15.5)
WBC: 5.7 10*3/uL (ref 4.0–10.5)

## 2020-03-06 LAB — URINALYSIS, ROUTINE W REFLEX MICROSCOPIC
Bilirubin Urine: NEGATIVE
Hgb urine dipstick: NEGATIVE
Ketones, ur: NEGATIVE
Leukocytes,Ua: NEGATIVE
Nitrite: NEGATIVE
Specific Gravity, Urine: 1.02 (ref 1.000–1.030)
Total Protein, Urine: NEGATIVE
Urine Glucose: NEGATIVE
Urobilinogen, UA: 0.2 (ref 0.0–1.0)
WBC, UA: NONE SEEN (ref 0–?)
pH: 7 (ref 5.0–8.0)

## 2020-03-06 LAB — HEMOGLOBIN A1C: Hgb A1c MFr Bld: 5.6 % (ref 4.6–6.5)

## 2020-03-06 LAB — PROTIME-INR
INR: 1 ratio (ref 0.8–1.0)
Prothrombin Time: 11.3 s (ref 9.6–13.1)

## 2020-03-06 LAB — ALBUMIN: Albumin: 4.5 g/dL (ref 3.5–5.2)

## 2020-03-06 NOTE — Telephone Encounter (Signed)
Patient called in and wanted to update immunizations. Flu was 12/05/19 at [facility] for the regular dose in L deltoid. Covid was 01/31/20 at [facility], [employer] given in L deltoid.

## 2020-03-06 NOTE — Progress Notes (Signed)
Subjective:    Patient ID: Felicia Ross, female    DOB: Jan 01, 1946, 74 y.o.   MRN: [id number]  HPI  Pt presents to the clinic today for preopscreening. She is due to have a right hip total arthroplasty by Dr Raphael Gibney on 03/29/19. She describes the pain as sharp and stabbing. The pain does not radiate. The pain is worse with sitting or changing from a sitting to a standing position. She denies numbness or tingling of the left lower extremity. She takes Tylenol Arthritis and Blue Emu with some relief of symptoms.  Review of Systems      Past Medical History:  Diagnosis Date  . Basal cell carcinoma   . Bipolar disorder ([employer])   . Depression   . Hypercholesteremia   . Osteopenia   . Thyroid goiter     Current Outpatient Medications  Medication Sig Dispense Refill  . Cholecalciferol (VITAMIN D) 2000 units CAPS Take 1 capsule by mouth daily.    Marland Kitchen levothyroxine (SYNTHROID) 112 MCG tablet TAKE 1 TABLET EVERY DAY 90 tablet 2  . lithium carbonate (LITHOBID) 300 MG CR tablet Take 1 tablet (300 mg total) by mouth 2 (two) times daily. 180 tablet 3  . mirtazapine (REMERON) 15 MG tablet Take 1 tablet (15 mg total) by mouth at bedtime. 90 tablet 3  . Multiple Vitamin (MULTIVITAMIN) tablet Take 1 tablet by mouth daily.    . Omega-3 Fatty Acids (FISH OIL) 1200 MG CAPS Take 1,200 mg by mouth 2 (two) times daily.     . simvastatin (ZOCOR) 10 MG tablet Take 1 tablet (10 mg total) by mouth at bedtime. 90 tablet 3   No current facility-administered medications for this visit.    Allergies  Allergen Reactions  . Fluzone [Influenza Virus Vaccine] Hives    High dose only---hives, itchy    Family History  Problem Relation Age of Onset  . Ovarian cancer Mother   . Hypertension Mother   . Heart attack Father   . Hypertension Brother   . Breast cancer Maternal Aunt   . Lung cancer Paternal Aunt   . Breast cancer Paternal Aunt   . Hypertension Other   . Breast cancer Cousin     Social  History   Socioeconomic History  . Marital status: Married    Spouse name: Lewis   . Number of children: 3  . Years of education: College  . Highest education level: Not on file  Occupational History  . Occupation: Part-time  Tobacco Use  . Smoking status: Never Smoker  . Smokeless tobacco: Never Used  Vaping Use  . Vaping Use: Never used  Substance and Sexual Activity  . Alcohol use: No    Alcohol/week: 0.0 standard drinks  . Drug use: No  . Sexual activity: Never  Other Topics Concern  . Not on file  Social History Narrative  . Not on file   Social Determinants of Health   Financial Resource Strain: Not on file  Food Insecurity: Not on file  Transportation Needs: Not on file  Physical Activity: Not on file  Stress: Not on file  Social Connections: Not on file  Intimate Partner Violence: Not on file     Constitutional: Denies fever, malaise, fatigue, headache or abrupt weight changes.  Respiratory: Denies difficulty breathing, shortness of breath, cough or sputum production.   Cardiovascular: Denies chest pain, chest tightness, palpitations or swelling in the hands or feet.  Musculoskeletal: Pt reports right hip pain. Denies difficulty with gait,  muscle pain or joint  swelling.  Skin: Denies redness, rashes, lesions or ulcercations.  Neurological: Denies numbness, tingling, weakness or problems with balance and coordination.    No other specific complaints in a complete review of systems (except as listed in HPI above).  Objective:   Physical Exam   BP (!) 148/70 (BP Location: Right Arm, Patient Position: Sitting, Cuff Size: Normal)   Pulse 72   Temp (!) 96.8 F (36 C) (Temporal)   Ht $R'5\' 7"'sJ$  (1.702 m)   Wt 169 lb (76.7 kg)   SpO2 95%   BMI 26.47 kg/m   Wt Readings from Last 3 Encounters:  09/09/19 164 lb 8 oz (74.6 kg)  08/09/19 157 lb 5.1 oz (71.4 kg)  06/16/19 166 lb (75.3 kg)    General: Appears her stated age, well developed, well nourished in  NAD. HEENT: Head: normal shape and size; Eyes: sclera white, no icterus, conjunctiva pink, PERRLA and EOMs intact;  Cardiovascular: Normal rate and rhythm. S1,S2 noted.  No murmur, rubs or gallops noted. Pedal pulses 2+ bilaterally. Pulmonary/Chest: Normal effort and positive vesicular breath sounds. No respiratory distress. No wheezes, rales or ronchi noted.  Musculoskeletal: Pain with flexion, extension and rotation and lateral bending to the left. Normal rotation and lateral bending to the right. No bony tenderness noted over the lumbar spine. Pain with abduction, abduction, internal and external rotation of the right hip. Bony tenderness noted over the right trochanter. Strength 5/5 BLE. Gait slow and steady without device. Neurological: Alert and oriented.    BMET    Component Value Date/Time   NA 139 06/16/2019 0902   NA 142 05/02/2015 1103   K 4.5 06/16/2019 0902   CL 102 06/16/2019 0902   CO2 32 06/16/2019 0902   GLUCOSE 98 06/16/2019 0902   BUN 17 06/16/2019 0902   BUN 21 05/02/2015 1103   CREATININE 0.92 06/16/2019 0902   CALCIUM 9.5 06/16/2019 0902   GFRNONAA 66 05/02/2015 1103   GFRAA 76 05/02/2015 1103    Lipid Panel     Component Value Date/Time   CHOL 174 06/16/2019 0902   CHOL 254 (H) 05/02/2015 1103   TRIG 178.0 (H) 06/16/2019 0902   HDL 51.20 06/16/2019 0902   HDL 62 05/02/2015 1103   CHOLHDL 3 06/16/2019 0902   VLDL 35.6 06/16/2019 0902   LDLCALC 87 06/16/2019 0902   LDLCALC 160 (H) 05/02/2015 1103    CBC    Component Value Date/Time   WBC 6.4 06/16/2019 0902   RBC 4.66 06/16/2019 0902   HGB 14.1 06/16/2019 0902   HGB 14.9 05/02/2015 1103   HCT 42.5 06/16/2019 0902   HCT 44.7 05/02/2015 1103   PLT 184.0 06/16/2019 0902   PLT 209 05/02/2015 1103   MCV 91.3 06/16/2019 0902   MCV 90 05/02/2015 1103   MCH 30.1 05/02/2015 1103   MCHC 33.2 06/16/2019 0902   RDW 13.5 06/16/2019 0902   RDW 13.7 05/02/2015 1103   LYMPHSABS 2.5 05/02/2015 1103    EOSABS 0.1 05/02/2015 1103   BASOSABS 0.0 05/02/2015 1103    Hgb A1C No results found for: HGBA1C         Assessment & Plan:   Preoperative Clearance for Right Total Hip Arthroplasty secondary to OA of the Right Hip:  Exam performed Indication for ECG: Perioperative clearance Interpretation of ECG: Normal rate and rhythm, no acute findings Comparison of ECG: None We will obtain CBC, C met, albumin, A1c, PT/INR and urinalysis today Surgical clearance form  will be completed and faxed to orthopedist once labs are reviewed  Return precautions discussed Webb Silversmith, NP This visit occurred during the SARS-CoV-2 public health emergency.  Safety protocols were in place, including screening questions prior to the visit, additional usage of staff PPE, and extensive cleaning of exam room while observing appropriate contact time as indicated for disinfecting solutions.

## 2020-03-07 ENCOUNTER — Encounter: Payer: Self-pay | Admitting: Internal Medicine

## 2020-03-07 NOTE — Patient Instructions (Signed)
Hip Exercises Ask your health care provider which exercises are safe for you. Do exercises exactly as told by your health care provider and adjust them as directed. It is normal to feel mild stretching, pulling, tightness, or discomfort as you do these exercises. Stop right away if you feel sudden pain or your pain gets worse. Do not begin these exercises until told by your health care provider. Stretching and range-of-motion exercises These exercises warm up your muscles and joints and improve the movement and flexibility of your hip. These exercises also help to relieve pain, numbness, and tingling. You may be asked to limit your range of motion if you had a hip replacement. Talk to your health care provider about these restrictions. Hamstrings, supine  1. Lie on your back (supine position). 2. Loop a belt or towel over the ball of your left / right foot. The ball of your foot is on the walking surface, right under your toes. 3. Straighten your left / right knee and slowly pull on the belt or towel to raise your leg until you feel a gentle stretch behind your knee (hamstring). ? Do not let your knee bend while you do this. ? Keep your other leg flat on the floor. 4. Hold this position for __________ seconds. 5. Slowly return your leg to the starting position. Repeat __________ times. Complete this exercise __________ times a day. Hip rotation  1. Lie on your back on a firm surface. 2. With your left / right hand, gently pull your left / right knee toward the shoulder that is on the same side of the body. Stop when your knee is pointing toward the ceiling. 3. Hold your left / right ankle with your other hand. 4. Keeping your knee steady, gently pull your left / right ankle toward your other shoulder until you feel a stretch in your buttocks. ? Keep your hips and shoulders firmly planted while you do this stretch. 5. Hold this position for __________ seconds. Repeat __________ times. Complete  this exercise __________ times a day. Seated stretch This exercise is sometimes called hamstrings and adductors stretch. 1. Sit on the floor with your legs stretched wide. Keep your knees straight during this exercise. 2. Keeping your head and back in a straight line, bend at your waist to reach for your left foot (position A). You should feel a stretch in your right inner thigh (adductors). 3. Hold this position for __________ seconds. Then slowly return to the upright position. 4. Keeping your head and back in a straight line, bend at your waist to reach forward (position B). You should feel a stretch behind both of your thighs and knees (hamstrings). 5. Hold this position for __________ seconds. Then slowly return to the upright position. 6. Keeping your head and back in a straight line, bend at your waist to reach for your right foot (position C). You should feel a stretch in your left inner thigh (adductors). 7. Hold this position for __________ seconds. Then slowly return to the upright position. Repeat __________ times. Complete this exercise __________ times a day. Lunge This exercise stretches the muscles of the hip (hip flexors). 1. Place your left / right knee on the floor and bend your other knee so that is directly over your ankle. You should be half-kneeling. 2. Keep good posture with your head over your shoulders. 3. Tighten your buttocks to point your tailbone downward. This will prevent your back from arching too much. 4. You should feel a   gentle stretch in the front of your left / right thigh and hip. If you do not feel a stretch, slide your other foot forward slightly and then slowly lunge forward with your chest up until your knee once again lines up over your ankle. ? Make sure your tailbone continues to point downward. 5. Hold this position for __________ seconds. 6. Slowly return to the starting position. Repeat __________ times. Complete this exercise __________ times a  day. Strengthening exercises These exercises build strength and endurance in your hip. Endurance is the ability to use your muscles for a long time, even after they get tired. Bridge This exercise strengthens the muscles of your hip (hip extensors). 1. Lie on your back on a firm surface with your knees bent and your feet flat on the floor. 2. Tighten your buttocks muscles and lift your bottom off the floor until the trunk of your body and your hips are level with your thighs. ? Do not arch your back. ? You should feel the muscles working in your buttocks and the back of your thighs. If you do not feel these muscles, slide your feet 1-2 inches (2.5-5 cm) farther away from your buttocks. 3. Hold this position for __________ seconds. 4. Slowly lower your hips to the starting position. 5. Let your muscles relax completely between repetitions. Repeat __________ times. Complete this exercise __________ times a day. Straight leg raises, side-lying This exercise strengthens the muscles that move the hip joint away from the center of the body (hip abductors). 1. Lie on your side with your left / right leg in the top position. Lie so your head, shoulder, hip, and knee line up. You may bend your bottom knee slightly to help you balance. 2. Roll your hips slightly forward, so your hips are stacked directly over each other and your left / right knee is facing forward. 3. Leading with your heel, lift your top leg 4-6 inches (10-15 cm). You should feel the muscles in your top hip lifting. ? Do not let your foot drift forward. ? Do not let your knee roll toward the ceiling. 4. Hold this position for __________ seconds. 5. Slowly return to the starting position. 6. Let your muscles relax completely between repetitions. Repeat __________ times. Complete this exercise __________ times a day. Straight leg raises, side-lying This exercise strengthens the muscles that move the hip joint toward the center of the  body (hip adductors). 1. Lie on your side with your left / right leg in the bottom position. Lie so your head, shoulder, hip, and knee line up. You may place your upper foot in front to help you balance. 2. Roll your hips slightly forward, so your hips are stacked directly over each other and your left / right knee is facing forward. 3. Tense the muscles in your inner thigh and lift your bottom leg 4-6 inches (10-15 cm). 4. Hold this position for __________ seconds. 5. Slowly return to the starting position. 6. Let your muscles relax completely between repetitions. Repeat __________ times. Complete this exercise __________ times a day. Straight leg raises, supine This exercise strengthens the muscles in the front of your thigh (quadriceps). 1. Lie on your back (supine position) with your left / right leg extended and your other knee bent. 2. Tense the muscles in the front of your left / right thigh. You should see your kneecap slide up or see increased dimpling just above your knee. 3. Keep these muscles tight as you raise your   leg 4-6 inches (10-15 cm) off the floor. Do not let your knee bend. 4. Hold this position for __________ seconds. 5. Keep these muscles tense as you lower your leg. 6. Relax the muscles slowly and completely between repetitions. Repeat __________ times. Complete this exercise __________ times a day. Hip abductors, standing This exercise strengthens the muscles that move the leg and hip joint away from the center of the body (hip abductors). 1. Tie one end of a rubber exercise band or tubing to a secure surface, such as a chair, table, or pole. 2. Loop the other end of the band or tubing around your left / right ankle. 3. Keeping your ankle with the band or tubing directly opposite the secured end, step away until there is tension in the tubing or band. Hold on to a chair, table, or pole as needed for balance. 4. Lift your left / right leg out to your side. While you do  this: ? Keep your back upright. ? Keep your shoulders over your hips. ? Keep your toes pointing forward. ? Make sure to use your hip muscles to slowly lift your leg. Do not tip your body or forcefully lift your leg. 5. Hold this position for __________ seconds. 6. Slowly return to the starting position. Repeat __________ times. Complete this exercise __________ times a day. Squats This exercise strengthens the muscles in the front of your thigh (quadriceps). 1. Stand in a door frame so your feet and knees are in line with the frame. You may place your hands on the frame for balance. 2. Slowly bend your knees and lower your hips like you are going to sit in a chair. ? Keep your lower legs in a straight-up-and-down position. ? Do not let your hips go lower than your knees. ? Do not bend your knees lower than told by your health care provider. ? If your hip pain increases, do not bend as low. 3. Hold this position for ___________ seconds. 4. Slowly push with your legs to return to standing. Do not use your hands to pull yourself to standing. Repeat __________ times. Complete this exercise __________ times a day. This information is not intended to replace advice given to you by your health care provider. Make sure you discuss any questions you have with your health care provider. Document Revised: 10/07/2018 Document Reviewed: 01/12/2018 [employer] Patient Education  2020 [employer]  

## 2020-03-19 NOTE — Patient Instructions (Addendum)
DUE TO COVID-19 ONLY ONE VISITOR IS ALLOWED TO COME WITH YOU AND STAY IN THE WAITING ROOM ONLY DURING PRE OP AND PROCEDURE DAY OF SURGERY. THE 1 VISITOR  MAY VISIT WITH YOU AFTER SURGERY IN YOUR PRIVATE ROOM DURING VISITING HOURS ONLY!  YOU NEED TO HAVE A COVID 19 TEST ON: 03/24/20 @ 9:00 AM, THIS TEST MUST BE DONE BEFORE SURGERY,  COVID TESTING SITE [address] [place] [id number], IT IS ON THE RIGHT GOING OUT [address] APPROXIMATELY  2 MINUTES PAST [employer] ON THE RIGHT. ONCE YOUR COVID TEST IS COMPLETED,  PLEASE BEGIN THE QUARANTINE INSTRUCTIONS AS OUTLINED IN YOUR HANDOUT.                Felicia Ross    Your procedure is scheduled on: 03/28/20   Report to [facility] Main  Entrance   Report to admitting at: 6:00 AM     Call this number if you have problems the morning of surgery [phone]    Remember:   NO SOLID FOOD AFTER MIDNIGHT THE NIGHT PRIOR TO SURGERY. NOTHING BY MOUTH EXCEPT CLEAR LIQUIDS UNTIL: 5:30 AM . PLEASE FINISH ENSURE DRINK PER SURGEON ORDER  WHICH NEEDS TO BE COMPLETED AT: 5:30 AM .  CLEAR LIQUID DIET   Foods Allowed                                                                     Foods Excluded  Coffee and tea, regular and decaf                             liquids that you cannot  Plain Jell-O any favor except red or purple                                           see through such as: Fruit ices (not with fruit pulp)                                     milk, soups, orange juice  Iced Popsicles                                    All solid food Carbonated beverages, regular and diet                                    Cranberry, grape and apple juices Sports drinks like Gatorade Lightly seasoned clear broth or consume(fat free) Sugar, honey syrup  Sample Menu Breakfast                                Lunch  Supper Cranberry juice                    Beef broth                             Chicken broth Jell-O                                     Grape juice                           Apple juice Coffee or tea                        Jell-O                                      Popsicle                                                Coffee or tea                        Coffee or tea  _____________________________________________________________________   BRUSH YOUR TEETH MORNING OF SURGERY AND RINSE YOUR MOUTH OUT, NO CHEWING GUM CANDY OR MINTS.   Take these medicines the morning of surgery with A SIP OF WATER: levothyroxine,lithium carbonate.                               You may not have any metal on your body including hair pins and              piercings  Do not wear jewelry, lotions, powders or perfumes, deodorant             Men may shave face and neck.   Do not bring valuables to the hospital. [place].  Contacts, dentures or bridgework may not be worn into surgery.  Leave suitcase in the car. After surgery it may be brought to your room.     Patients discharged the day of surgery will not be allowed to drive home. IF YOU ARE HAVING SURGERY AND GOING HOME THE SAME DAY, YOU MUST HAVE AN ADULT TO DRIVE YOU HOME AND BE WITH YOU FOR 24 HOURS. YOU MAY GO HOME BY TAXI OR UBER OR ORTHERWISE, BUT AN ADULT MUST ACCOMPANY YOU HOME AND STAY WITH YOU FOR 24 HOURS.  Name and phone number of your driver:  Special Instructions: N/A              Please read over the following fact sheets you were given: _____________________________________________________________________          [facility] - Preparing for Surgery Before surgery, you can play an important role.  Because skin is not sterile, your skin needs to be as free of germs as possible.  You can reduce the number of germs on your skin by washing with CHG (chlorahexidine  gluconate) soap before surgery.  CHG is an antiseptic cleaner which kills germs and bonds with the skin to  continue killing germs even after washing. Please DO NOT use if you have an allergy to CHG or antibacterial soaps.  If your skin becomes reddened/irritated stop using the CHG and inform your nurse when you arrive at Short Stay. Do not shave (including legs and underarms) for at least 48 hours prior to the first CHG shower.  You may shave your face/neck. Please follow these instructions carefully:  1.  Shower with CHG Soap the night before surgery and the  morning of Surgery.  2.  If you choose to wash your hair, wash your hair first as usual with your  normal  shampoo.  3.  After you shampoo, rinse your hair and body thoroughly to remove the  shampoo.                           4.  Use CHG as you would any other liquid soap.  You can apply chg directly  to the skin and wash                       Gently with a scrungie or clean washcloth.  5.  Apply the CHG Soap to your body ONLY FROM THE NECK DOWN.   Do not use on face/ open                           Wound or open sores. Avoid contact with eyes, ears mouth and genitals (private parts).                       Wash face,  Genitals (private parts) with your normal soap.             6.  Wash thoroughly, paying special attention to the area where your surgery  will be performed.  7.  Thoroughly rinse your body with warm water from the neck down.  8.  DO NOT shower/wash with your normal soap after using and rinsing off  the CHG Soap.                9.  Pat yourself dry with a clean towel.            10.  Wear clean pajamas.            11.  Place clean sheets on your bed the night of your first shower and do not  sleep with pets. Day of Surgery : Do not apply any lotions/deodorants the morning of surgery.  Please wear clean clothes to the hospital/surgery center.  FAILURE TO FOLLOW THESE INSTRUCTIONS MAY RESULT IN THE CANCELLATION OF YOUR SURGERY PATIENT SIGNATURE_________________________________  NURSE  SIGNATURE__________________________________  ________________________________________________________________________   Adam Phenix  An incentive spirometer is a tool that can help keep your lungs clear and active. This tool measures how well you are filling your lungs with each breath. Taking long deep breaths may help reverse or decrease the chance of developing breathing (pulmonary) problems (especially infection) following:  A long period of time when you are unable to move or be active. BEFORE THE PROCEDURE   If the spirometer includes an indicator to show your best effort, your nurse or respiratory therapist will set it to a desired goal.  If possible, sit up straight or lean slightly  forward. Try not to slouch.  Hold the incentive spirometer in an upright position. INSTRUCTIONS FOR USE  1. Sit on the edge of your bed if possible, or sit up as far as you can in bed or on a chair. 2. Hold the incentive spirometer in an upright position. 3. Breathe out normally. 4. Place the mouthpiece in your mouth and seal your lips tightly around it. 5. Breathe in slowly and as deeply as possible, raising the piston or the ball toward the top of the column. 6. Hold your breath for 3-5 seconds or for as long as possible. Allow the piston or ball to fall to the bottom of the column. 7. Remove the mouthpiece from your mouth and breathe out normally. 8. Rest for a few seconds and repeat Steps 1 through 7 at least 10 times every 1-2 hours when you are awake. Take your time and take a few normal breaths between deep breaths. 9. The spirometer may include an indicator to show your best effort. Use the indicator as a goal to work toward during each repetition. 10. After each set of 10 deep breaths, practice coughing to be sure your lungs are clear. If you have an incision (the cut made at the time of surgery), support your incision when coughing by placing a pillow or rolled up towels firmly  against it. Once you are able to get out of bed, walk around indoors and cough well. You may stop using the incentive spirometer when instructed by your caregiver.  RISKS AND COMPLICATIONS  Take your time so you do not get dizzy or light-headed.  If you are in pain, you may need to take or ask for pain medication before doing incentive spirometry. It is harder to take a deep breath if you are having pain. AFTER USE  Rest and breathe slowly and easily.  It can be helpful to keep track of a log of your progress. Your caregiver can provide you with a simple table to help with this. If you are using the spirometer at home, follow these instructions: [place] IF:   You are having difficultly using the spirometer.  You have trouble using the spirometer as often as instructed.  Your pain medication is not giving enough relief while using the spirometer.  You develop fever of 100.5 F (38.1 C) or higher. SEEK IMMEDIATE MEDICAL CARE IF:   You cough up bloody sputum that had not been present before.  You develop fever of 102 F (38.9 C) or greater.  You develop worsening pain at or near the incision site. MAKE SURE YOU:   Understand these instructions.  Will watch your condition.  Will get help right away if you are not doing well or get worse. Document Released: 07/14/2006 Document Revised: 05/26/2011 Document Reviewed: 09/14/2006 [facility] Patient Information 2014 [place], [place].   ________________________________________________________________________

## 2020-03-20 ENCOUNTER — Encounter (HOSPITAL_COMMUNITY)
Admission: RE | Admit: 2020-03-20 | Discharge: 2020-03-20 | Disposition: A | Discharge status: Home / Self Care | Payer: Medicare HMO | Source: Ambulatory Visit | Attending: Orthopedic Surgery | Admitting: Orthopedic Surgery

## 2020-03-20 ENCOUNTER — Encounter (HOSPITAL_COMMUNITY): Payer: Self-pay

## 2020-03-20 ENCOUNTER — Other Ambulatory Visit: Payer: Self-pay

## 2020-03-20 DIAGNOSIS — Z01818 Encounter for other preprocedural examination: Secondary | ICD-10-CM | POA: Insufficient documentation

## 2020-03-20 HISTORY — DX: Unspecified osteoarthritis, unspecified site: M19.90

## 2020-03-20 HISTORY — DX: Hypothyroidism, unspecified: E03.9

## 2020-03-20 LAB — CBC
HCT: 47.7 % — ABNORMAL HIGH (ref 36.0–46.0)
Hemoglobin: 15.2 g/dL — ABNORMAL HIGH (ref 12.0–15.0)
MCH: 30.1 pg (ref 26.0–34.0)
MCHC: 31.9 g/dL (ref 30.0–36.0)
MCV: 94.5 fL (ref 80.0–100.0)
Platelets: 174 10*3/uL (ref 150–400)
RBC: 5.05 MIL/uL (ref 3.87–5.11)
RDW: 13 % (ref 11.5–15.5)
WBC: 6.7 10*3/uL (ref 4.0–10.5)
nRBC: 0 % (ref 0.0–0.2)

## 2020-03-20 LAB — PROTIME-INR
INR: 1.1 (ref 0.8–1.2)
Prothrombin Time: 13.9 seconds (ref 11.4–15.2)

## 2020-03-20 LAB — COMPREHENSIVE METABOLIC PANEL
ALT: 23 U/L (ref 0–44)
AST: 23 U/L (ref 15–41)
Albumin: 4.5 g/dL (ref 3.5–5.0)
Alkaline Phosphatase: 70 U/L (ref 38–126)
Anion gap: 11 (ref 5–15)
BUN: 19 mg/dL (ref 8–23)
CO2: 27 mmol/L (ref 22–32)
Calcium: 9.6 mg/dL (ref 8.9–10.3)
Chloride: 102 mmol/L (ref 98–111)
Creatinine, Ser: 0.92 mg/dL (ref 0.44–1.00)
GFR, Estimated: >60 mL/min (ref >60)
Glucose, Bld: 103 mg/dL — ABNORMAL HIGH (ref 70–99)
Potassium: 4.4 mmol/L (ref 3.5–5.1)
Sodium: 140 mmol/L (ref 135–145)
Total Bilirubin: 0.9 mg/dL (ref 0.3–1.2)
Total Protein: 7.7 g/dL (ref 6.5–8.1)

## 2020-03-20 LAB — APTT: aPTT: 28 seconds (ref 24–36)

## 2020-03-20 LAB — SURGICAL PCR SCREEN
MRSA, PCR: NEGATIVE
Staphylococcus aureus: NEGATIVE

## 2020-03-20 NOTE — Progress Notes (Signed)
COVID Vaccine Completed: Yes Date COVID Vaccine completed: 01/31/20. [place] COVID vaccine manufacturer:     Moderna    PCP - Nicki Reaper: NP. :Clearance: 03/06/20.: Epic. Cardiologist -   Chest x-ray -  EKG -  Stress Test -  ECHO -  Cardiac Cath -  Pacemaker/ICD device last checked:  Sleep Study -  CPAP -   Fasting Blood Sugar -  Checks Blood Sugar _____ times a day  Blood Thinner Instructions: Aspirin Instructions: Last Dose:  Anesthesia review:   Patient denies shortness of breath, fever, cough and chest pain at PAT appointment   Patient verbalized understanding of instructions that were given to them at the PAT appointment. Patient was also instructed that they will need to review over the PAT instructions again at home before surgery.

## 2020-03-21 NOTE — H&P (Signed)
TOTAL HIP ADMISSION H&P  Patient is admitted for right total hip arthroplasty.  Subjective:  Chief Complaint: Right hip pain  HPI: Felicia Ross, 75 y.o. female, has a history of pain and functional disability in the right hip due to arthritis and patient has failed non-surgical conservative treatments for greater than 12 weeks to include NSAID's and/or analgesics and activity modification. Onset of symptoms was gradual, starting several years ago with gradually worsening course since that time. The patient noted no past surgery on the right hip. Patient currently rates pain in the right hip at 8 out of 10 with activity. Patient has worsening of pain with activity and weight bearing, pain that interfers with activities of daily living, pain with passive range of motion and crepitus. Patient has evidence of severe bone-on-bone arthritis in the right hip with large marginal osteophytes and subchondral cysts by imaging studies. This condition presents safety issues increasing the risk of falls. There is no current active infection.  Patient Active Problem List   Diagnosis Date Noted  . Hypothyroidism 01/30/2015  . 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 Hypercholesteremia 09/15/2014  . Osteopenia 09/15/2014    Past Medical History:  Diagnosis Date  . Arthritis   . Bipolar disorder ([place])   . Depression   . Hypercholesteremia   . Hypothyroidism   . Osteopenia   . Thyroid goiter     Past Surgical History:  Procedure Laterality Date  . COLONOSCOPY WITH PROPOFOL N/A 08/09/2019   Procedure: COLONOSCOPY WITH PROPOFOL;  Surgeon: Jonathon Bellows, MD;  Location[facility] ENDOSCOPY;  Service: Gastroenterology;  Laterality: N/A;  . THYROID SURGERY  2010  . TUBAL LIGATION      Prior to Admission medications   Medication Sig Start Date End Date Taking? Authorizing Provider  Cholecalciferol (VITAMIN D) 2000 units CAPS Take 1 capsule by mouth daily.   Yes [provider]  levothyroxine  (SYNTHROID) 112 MCG tablet TAKE 1 TABLET EVERY DAY Patient taking differently: Take 112 mcg by mouth daily before breakfast. 08/13/18  Yes Baity, Coralie Keens, NP  lithium carbonate (LITHOBID) 300 MG CR tablet Take 1 tablet (300 mg total) by mouth 2 (two) times daily. 06/16/19  Yes Jearld Fenton, NP  mirtazapine (REMERON) 15 MG tablet Take 1 tablet (15 mg total) by mouth at bedtime. 06/16/19  Yes Jearld Fenton, NP  Multiple Vitamin (MULTIVITAMIN) tablet Take 1 tablet by mouth daily.   Yes [provider]  Omega-3 Fatty Acids (FISH OIL) 1200 MG CAPS Take 1,200 mg by mouth 2 (two) times daily.    Yes [provider]  simvastatin (ZOCOR) 10 MG tablet Take 1 tablet (10 mg total) by mouth at bedtime. 06/16/19  Yes Jearld Fenton, NP    Allergies  Allergen Reactions  . Fluzone [Influenza Virus Vaccine] Hives    High dose only---hives, itchy    Social History   Socioeconomic History  . Marital status: Married    Spouse name: Lewis   . Number of children: 3  . Years of education: College  . Highest education level: Not on file  Occupational History  . Occupation: Part-time  Tobacco Use  . Smoking status: Never Smoker  . Smokeless tobacco: Never Used  Vaping Use  . Vaping Use: Never used  Substance and Sexual Activity  . Alcohol use: No    Alcohol/week: 0.0 standard drinks  . Drug use: No  . Sexual activity: Never  Other Topics Concern  . Not on file  Social  History Narrative  . Not on file   Social Determinants of Health   Financial Resource Strain: Not on file  Food Insecurity: Not on file  Transportation Needs: Not on file  Physical Activity: Not on file  Stress: Not on file  Social Connections: Not on file  Intimate Partner Violence: Not on file    Tobacco Use: Low Risk   . Smoking Tobacco Use: Never Smoker  . Smokeless Tobacco Use: Never Used   Social History   Substance and Sexual Activity  Alcohol Use No  . Alcohol/week: 0.0 standard drinks     Family History  Problem Relation Age of Onset  . Ovarian cancer Mother   . Hypertension Mother   . Heart attack Father   . Hypertension Brother   . Breast cancer Maternal Aunt   . Lung cancer Paternal Aunt   . Breast cancer Paternal Aunt   . Hypertension Other   . Breast cancer Cousin     Review of Systems  Constitutional: Negative for chills and fever.  HENT: Negative for congestion, sore throat and tinnitus.   Eyes: Negative for double vision, photophobia and pain.  Respiratory: Negative for cough, shortness of breath and wheezing.   Cardiovascular: Negative for chest pain, palpitations and orthopnea.  Gastrointestinal: Negative for heartburn, nausea and vomiting.  Genitourinary: Negative for dysuria, frequency and urgency.  Musculoskeletal: Positive for joint pain.  Neurological: Negative for dizziness, weakness and headaches.     Objective:  Physical Exam: Well nourished and well developed.  General: Alert and oriented x3, cooperative and pleasant, no acute distress.  Head: normocephalic, atraumatic, neck supple.  Eyes: EOMI.  Respiratory: breath sounds clear in all fields, no wheezing, rales, or rhonchi. Cardiovascular: Regular rate and rhythm, no murmurs, gallops or rubs.  Abdomen: non-tender to palpation and soft, normoactive bowel sounds. Musculoskeletal:  Right Hip Exam:  The range of motion: Flexion to 100 degrees and abduction to 20 degrees. She has an external rotation contracture of approximately 10 degrees. She can externally rotate approximately 20 degrees beyond that.  There is no tenderness over the greater trochanteric bursa.   Calves soft and nontender. Motor function intact in LE. Strength 5/5 LE bilaterally. Neuro: Distal pulses 2+. Sensation to light touch intact in LE.  Imaging Review Plain radiographs demonstrate severe degenerative joint disease of the right hip. The bone quality appears to be adequate for age and reported activity  level.  Assessment/Plan:  End stage arthritis, right hip  The patient history, physical examination, clinical judgement of the provider and imaging studies are consistent with end stage degenerative joint disease of the right hip and total hip arthroplasty is deemed medically necessary. The treatment options including medical management, injection therapy, arthroscopy and arthroplasty were discussed at length. The risks and benefits of total hip arthroplasty were presented and reviewed. The risks due to aseptic loosening, infection, stiffness, dislocation/subluxation, thromboembolic complications and other imponderables were discussed. The patient acknowledged the explanation, agreed to proceed with the plan and consent was signed. Patient is being admitted for inpatient treatment for surgery, pain control, PT, OT, prophylactic antibiotics, VTE prophylaxis, progressive ambulation and ADLs and discharge planning.The patient is planning to be discharged home.   Patient's anticipated LOS is less than 2 midnights, meeting these requirements: - Younger than 46 - Lives within 1 hour of care - Has a competent adult at home to recover with post-op recover - NO history of  - Chronic pain requiring opiods  - Diabetes  - Coronary Artery  Disease  - Heart failure  - Heart attack  - Stroke  - DVT/VTE  - Cardiac arrhythmia  - Respiratory Failure/COPD  - Renal failure  - Anemia  - Advanced Liver disease  Therapy Plans: HEP Disposition: Home with husband Planned DVT Prophylaxis: Aspirin 325 mg BID DME Needed: None PCP: Webb Silversmith, FNP (clearance in Epic) TXA: IV Allergies: Flu vaccine Anesthesia Concerns: None BMI: 26.3 Last HgbA1c: Not diabetic.  [facility]  - Patient was instructed on what medications to stop prior to surgery. - Follow-up visit in 2 weeks with Dr. Wynelle Link - Begin physical therapy following surgery - Pre-operative lab work as pre-surgical  testing - Prescriptions will be provided in hospital at time of discharge  Theresa Duty, PA-C Orthopedic Surgery [facility] [place]

## 2020-03-24 ENCOUNTER — Other Ambulatory Visit (HOSPITAL_COMMUNITY)
Admission: RE | Admit: 2020-03-24 | Discharge: 2020-03-24 | Disposition: A | Discharge status: Home / Self Care | Payer: Medicare HMO | Source: Ambulatory Visit | Attending: Orthopedic Surgery | Admitting: Orthopedic Surgery

## 2020-03-24 DIAGNOSIS — Z01812 Encounter for preprocedural laboratory examination: Secondary | ICD-10-CM | POA: Insufficient documentation

## 2020-03-24 DIAGNOSIS — Z20822 Contact with and (suspected) exposure to covid-19: Secondary | ICD-10-CM | POA: Insufficient documentation

## 2020-03-25 LAB — SARS CORONAVIRUS 2 (TAT 6-24 HRS): SARS Coronavirus 2: NEGATIVE

## 2020-03-27 NOTE — Anesthesia Preprocedure Evaluation (Addendum)
Anesthesia Evaluation  Patient identified by MRN, date of birth, ID band Patient awake    Reviewed: Allergy & Precautions, NPO status , Patient's Chart, lab work & pertinent test results  Airway Mallampati: II  TM Distance: >3 FB Neck ROM: Full    Dental no notable dental hx. (+) Teeth Intact, Dental Advisory Given   Pulmonary neg pulmonary ROS,    Pulmonary exam normal breath sounds clear to auscultation       Cardiovascular negative cardio ROS Normal cardiovascular exam Rhythm:Regular Rate:Normal     Neuro/Psych PSYCHIATRIC DISORDERS Depression Bipolar Disorder negative neurological ROS     GI/Hepatic negative GI ROS, Neg liver ROS,   Endo/Other  Hypothyroidism   Renal/GU negative Renal ROS  negative genitourinary   Musculoskeletal  (+) Arthritis ,   Abdominal   Peds  Hematology negative hematology ROS (+)   Anesthesia Other Findings   Reproductive/Obstetrics                            Anesthesia Physical Anesthesia Plan  ASA: II  Anesthesia Plan: Spinal   Post-op Pain Management:    Induction:   PONV Risk Score and Plan: 2 and Treatment may vary due to age or medical condition, Propofol infusion, Midazolam, Ondansetron and Dexamethasone  Airway Management Planned: Natural Airway  Additional Equipment:   Intra-op Plan:   Post-operative Plan:   Informed Consent: I have reviewed the patients History and Physical, chart, labs and discussed the procedure including the risks, benefits and alternatives for the proposed anesthesia with the patient or authorized representative who has indicated his/her understanding and acceptance.     Dental advisory given  Plan Discussed with: CRNA  Anesthesia Plan Comments:        Anesthesia Quick Evaluation

## 2020-03-28 ENCOUNTER — Ambulatory Visit (HOSPITAL_COMMUNITY): Payer: Medicare HMO | Admitting: Anesthesiology

## 2020-03-28 ENCOUNTER — Ambulatory Visit (HOSPITAL_COMMUNITY): Payer: Medicare HMO

## 2020-03-28 ENCOUNTER — Other Ambulatory Visit: Payer: Self-pay

## 2020-03-28 ENCOUNTER — Encounter (HOSPITAL_COMMUNITY): Payer: Self-pay | Admitting: Orthopedic Surgery

## 2020-03-28 ENCOUNTER — Observation Stay (HOSPITAL_COMMUNITY)
Admission: RE | Admit: 2020-03-28 | Discharge: 2020-03-29 | Disposition: A | Discharge status: Home / Self Care | Payer: Medicare HMO | Attending: Orthopedic Surgery | Admitting: Orthopedic Surgery

## 2020-03-28 ENCOUNTER — Encounter (HOSPITAL_COMMUNITY)
Admission: RE | Disposition: A | Discharge status: Home / Self Care | Payer: Self-pay | Source: Home / Self Care | Attending: Orthopedic Surgery

## 2020-03-28 ENCOUNTER — Observation Stay (HOSPITAL_COMMUNITY): Payer: Medicare HMO

## 2020-03-28 DIAGNOSIS — Z96641 Presence of right artificial hip joint: Secondary | ICD-10-CM | POA: Diagnosis not present

## 2020-03-28 DIAGNOSIS — M1611 Unilateral primary osteoarthritis, right hip: Principal | ICD-10-CM | POA: Insufficient documentation

## 2020-03-28 DIAGNOSIS — Z96649 Presence of unspecified artificial hip joint: Secondary | ICD-10-CM

## 2020-03-28 DIAGNOSIS — Z79899 Other long term (current) drug therapy: Secondary | ICD-10-CM | POA: Insufficient documentation

## 2020-03-28 DIAGNOSIS — Z9889 Other specified postprocedural states: Secondary | ICD-10-CM | POA: Diagnosis not present

## 2020-03-28 DIAGNOSIS — Z471 Aftercare following joint replacement surgery: Secondary | ICD-10-CM | POA: Diagnosis not present

## 2020-03-28 DIAGNOSIS — E039 Hypothyroidism, unspecified: Secondary | ICD-10-CM | POA: Diagnosis not present

## 2020-03-28 DIAGNOSIS — F32A Depression, unspecified: Secondary | ICD-10-CM | POA: Diagnosis not present

## 2020-03-28 DIAGNOSIS — E7800 Pure hypercholesterolemia, unspecified: Secondary | ICD-10-CM | POA: Diagnosis not present

## 2020-03-28 DIAGNOSIS — M169 Osteoarthritis of hip, unspecified: Secondary | ICD-10-CM | POA: Diagnosis present

## 2020-03-28 DIAGNOSIS — M25551 Pain in right hip: Secondary | ICD-10-CM | POA: Diagnosis present

## 2020-03-28 DIAGNOSIS — Z419 Encounter for procedure for purposes other than remedying health state, unspecified: Secondary | ICD-10-CM

## 2020-03-28 HISTORY — PX: TOTAL HIP ARTHROPLASTY: SHX124

## 2020-03-28 LAB — TYPE AND SCREEN
ABO/RH(D): A NEG
Antibody Screen: NEGATIVE

## 2020-03-28 LAB — ABO/RH: ABO/RH(D): A NEG

## 2020-03-28 SURGERY — ARTHROPLASTY, HIP, TOTAL, ANTERIOR APPROACH
Anesthesia: Spinal | Site: Hip | Laterality: Right

## 2020-03-28 MED ORDER — BISACODYL 10 MG RE SUPP: 10.0000 mg | Freq: Every day | RECTAL | Status: DC | PRN

## 2020-03-28 MED ORDER — ASPIRIN EC 325 MG PO TBEC
325.0000 mg | DELAYED_RELEASE_TABLET | Freq: Two times a day (BID) | ORAL | Status: DC
  Administered 2020-03-29: 325 mg via ORAL
  Filled 2020-03-28: qty 1

## 2020-03-28 MED ORDER — METOCLOPRAMIDE HCL 5 MG PO TABS: 5.0000 mg | ORAL_TABLET | Freq: Three times a day (TID) | ORAL | Status: DC | PRN

## 2020-03-28 MED ORDER — METHOCARBAMOL 500 MG IVPB - SIMPLE MED
500.0000 mg | Freq: Four times a day (QID) | INTRAVENOUS | Status: DC | PRN
  Administered 2020-03-28: 500 mg via INTRAVENOUS
  Filled 2020-03-28: qty 50

## 2020-03-28 MED ORDER — POLYETHYLENE GLYCOL 3350 17 G PO PACK: 17.0000 g | PACK | Freq: Every day | ORAL | Status: DC | PRN

## 2020-03-28 MED ORDER — PHENOL 1.4 % MT LIQD: 1.0000 | OROMUCOSAL | Status: DC | PRN

## 2020-03-28 MED ORDER — DIPHENHYDRAMINE HCL 12.5 MG/5ML PO ELIX: 12.5000 mg | ORAL_SOLUTION | ORAL | Status: DC | PRN

## 2020-03-28 MED ORDER — FENTANYL CITRATE (PF) 100 MCG/2ML IJ SOLN
INTRAMUSCULAR | Status: AC
  Filled 2020-03-28: qty 2

## 2020-03-28 MED ORDER — HYDROCODONE-ACETAMINOPHEN 7.5-325 MG PO TABS
1.0000 | ORAL_TABLET | ORAL | Status: DC | PRN
  Administered 2020-03-28 – 2020-03-29 (×3): 2 via ORAL
  Filled 2020-03-28 (×6): qty 2

## 2020-03-28 MED ORDER — PROPOFOL 500 MG/50ML IV EMUL
INTRAVENOUS | Status: DC | PRN
  Administered 2020-03-28: 75 ug/kg/min via INTRAVENOUS

## 2020-03-28 MED ORDER — MIDAZOLAM HCL 2 MG/2ML IJ SOLN
INTRAMUSCULAR | Status: AC
  Filled 2020-03-28: qty 2

## 2020-03-28 MED ORDER — ACETAMINOPHEN 500 MG PO TABS
500.0000 mg | ORAL_TABLET | Freq: Four times a day (QID) | ORAL | Status: AC
  Administered 2020-03-28 – 2020-03-29 (×2): 500 mg via ORAL
  Filled 2020-03-28 (×2): qty 1

## 2020-03-28 MED ORDER — POVIDONE-IODINE 10 % EX SWAB
2.0000 "application " | Freq: Once | CUTANEOUS | Status: AC
  Administered 2020-03-28: 2 via TOPICAL

## 2020-03-28 MED ORDER — ONDANSETRON HCL 4 MG/2ML IJ SOLN: 4.0000 mg | Freq: Four times a day (QID) | INTRAMUSCULAR | Status: DC | PRN

## 2020-03-28 MED ORDER — DEXAMETHASONE SODIUM PHOSPHATE 10 MG/ML IJ SOLN: 8.0000 mg | Freq: Once | INTRAMUSCULAR | Status: DC

## 2020-03-28 MED ORDER — ACETAMINOPHEN 10 MG/ML IV SOLN
1000.0000 mg | Freq: Four times a day (QID) | INTRAVENOUS | Status: DC
  Administered 2020-03-28: 1000 mg via INTRAVENOUS
  Filled 2020-03-28: qty 100

## 2020-03-28 MED ORDER — WATER FOR IRRIGATION, STERILE IR SOLN
Status: DC | PRN
  Administered 2020-03-28: 2000 mL

## 2020-03-28 MED ORDER — MIRTAZAPINE 15 MG PO TABS
15.0000 mg | ORAL_TABLET | Freq: Every day | ORAL | Status: DC
  Administered 2020-03-28: 15 mg via ORAL
  Filled 2020-03-28: qty 1

## 2020-03-28 MED ORDER — ONDANSETRON HCL 4 MG/2ML IJ SOLN
INTRAMUSCULAR | Status: DC | PRN
  Administered 2020-03-28: 4 mg via INTRAVENOUS

## 2020-03-28 MED ORDER — DEXAMETHASONE SODIUM PHOSPHATE 10 MG/ML IJ SOLN
INTRAMUSCULAR | Status: DC | PRN
  Administered 2020-03-28: 10 mg via INTRAVENOUS

## 2020-03-28 MED ORDER — METHOCARBAMOL 500 MG IVPB - SIMPLE MED
INTRAVENOUS | Status: AC
  Filled 2020-03-28: qty 50

## 2020-03-28 MED ORDER — DEXAMETHASONE SODIUM PHOSPHATE 10 MG/ML IJ SOLN
INTRAMUSCULAR | Status: AC
  Filled 2020-03-28: qty 1

## 2020-03-28 MED ORDER — ONDANSETRON HCL 4 MG PO TABS: 4.0000 mg | ORAL_TABLET | Freq: Four times a day (QID) | ORAL | Status: DC | PRN

## 2020-03-28 MED ORDER — EPHEDRINE SULFATE-NACL 50-0.9 MG/10ML-% IV SOSY
PREFILLED_SYRINGE | INTRAVENOUS | Status: DC | PRN
  Administered 2020-03-28 (×3): 10 mg via INTRAVENOUS

## 2020-03-28 MED ORDER — MENTHOL 3 MG MT LOZG: 1.0000 | LOZENGE | OROMUCOSAL | Status: DC | PRN

## 2020-03-28 MED ORDER — LACTATED RINGERS IV SOLN: INTRAVENOUS | Status: DC

## 2020-03-28 MED ORDER — ORAL CARE MOUTH RINSE: 15.0000 mL | Freq: Once | OROMUCOSAL | Status: AC

## 2020-03-28 MED ORDER — SODIUM CHLORIDE 0.9 % IV SOLN: INTRAVENOUS | Status: DC

## 2020-03-28 MED ORDER — LITHIUM CARBONATE ER 300 MG PO TBCR
300.0000 mg | EXTENDED_RELEASE_TABLET | Freq: Two times a day (BID) | ORAL | Status: DC
  Administered 2020-03-28 – 2020-03-29 (×2): 300 mg via ORAL
  Filled 2020-03-28 (×2): qty 1

## 2020-03-28 MED ORDER — BUPIVACAINE HCL 0.25 % IJ SOLN
INTRAMUSCULAR | Status: AC
  Filled 2020-03-28: qty 1

## 2020-03-28 MED ORDER — ONDANSETRON HCL 4 MG/2ML IJ SOLN
INTRAMUSCULAR | Status: AC
  Filled 2020-03-28: qty 2

## 2020-03-28 MED ORDER — HYDROCODONE-ACETAMINOPHEN 5-325 MG PO TABS
1.0000 | ORAL_TABLET | ORAL | Status: DC | PRN
  Administered 2020-03-28: 2 via ORAL
  Filled 2020-03-28: qty 2

## 2020-03-28 MED ORDER — DOCUSATE SODIUM 100 MG PO CAPS
100.0000 mg | ORAL_CAPSULE | Freq: Two times a day (BID) | ORAL | Status: DC
  Administered 2020-03-28 – 2020-03-29 (×2): 100 mg via ORAL
  Filled 2020-03-28 (×2): qty 1

## 2020-03-28 MED ORDER — FENTANYL CITRATE (PF) 100 MCG/2ML IJ SOLN
25.0000 ug | INTRAMUSCULAR | Status: DC | PRN
  Administered 2020-03-28: 25 ug via INTRAVENOUS

## 2020-03-28 MED ORDER — EPHEDRINE 5 MG/ML INJ
INTRAVENOUS | Status: AC
  Filled 2020-03-28: qty 10

## 2020-03-28 MED ORDER — MIDAZOLAM HCL 5 MG/5ML IJ SOLN
INTRAMUSCULAR | Status: DC | PRN
  Administered 2020-03-28: 2 mg via INTRAVENOUS

## 2020-03-28 MED ORDER — CEFAZOLIN SODIUM-DEXTROSE 2-4 GM/100ML-% IV SOLN
2.0000 g | INTRAVENOUS | Status: AC
  Administered 2020-03-28: 2 g via INTRAVENOUS
  Filled 2020-03-28: qty 100

## 2020-03-28 MED ORDER — SIMVASTATIN 20 MG PO TABS
10.0000 mg | ORAL_TABLET | Freq: Every day | ORAL | Status: DC
  Administered 2020-03-28: 10 mg via ORAL
  Filled 2020-03-28: qty 1

## 2020-03-28 MED ORDER — TRANEXAMIC ACID-NACL 1000-0.7 MG/100ML-% IV SOLN
1000.0000 mg | INTRAVENOUS | Status: AC
  Administered 2020-03-28: 1000 mg via INTRAVENOUS
  Filled 2020-03-28: qty 100

## 2020-03-28 MED ORDER — BUPIVACAINE IN DEXTROSE 0.75-8.25 % IT SOLN
INTRATHECAL | Status: DC | PRN
  Administered 2020-03-28: 1.8 mL via INTRATHECAL

## 2020-03-28 MED ORDER — PROPOFOL 500 MG/50ML IV EMUL
INTRAVENOUS | Status: AC
  Filled 2020-03-28: qty 50

## 2020-03-28 MED ORDER — FLEET ENEMA 7-19 GM/118ML RE ENEM: 1.0000 | ENEMA | Freq: Once | RECTAL | Status: DC | PRN

## 2020-03-28 MED ORDER — METOCLOPRAMIDE HCL 5 MG/ML IJ SOLN: 5.0000 mg | Freq: Three times a day (TID) | INTRAMUSCULAR | Status: DC | PRN

## 2020-03-28 MED ORDER — METHOCARBAMOL 500 MG PO TABS
500.0000 mg | ORAL_TABLET | Freq: Four times a day (QID) | ORAL | Status: DC | PRN
  Administered 2020-03-28 – 2020-03-29 (×2): 500 mg via ORAL
  Filled 2020-03-28 (×2): qty 1

## 2020-03-28 MED ORDER — DEXAMETHASONE SODIUM PHOSPHATE 10 MG/ML IJ SOLN
10.0000 mg | Freq: Once | INTRAMUSCULAR | Status: AC
  Administered 2020-03-29: 10 mg via INTRAVENOUS
  Filled 2020-03-28: qty 1

## 2020-03-28 MED ORDER — CEFAZOLIN SODIUM-DEXTROSE 2-4 GM/100ML-% IV SOLN
2.0000 g | Freq: Four times a day (QID) | INTRAVENOUS | Status: AC
  Administered 2020-03-28 (×2): 2 g via INTRAVENOUS
  Filled 2020-03-28 (×2): qty 100

## 2020-03-28 MED ORDER — MORPHINE SULFATE (PF) 2 MG/ML IV SOLN
1.0000 mg | INTRAVENOUS | Status: DC | PRN
  Administered 2020-03-28: 1 mg via INTRAVENOUS
  Filled 2020-03-28: qty 1

## 2020-03-28 MED ORDER — LEVOTHYROXINE SODIUM 112 MCG PO TABS
112.0000 ug | ORAL_TABLET | Freq: Every day | ORAL | Status: DC
  Administered 2020-03-29: 112 ug via ORAL
  Filled 2020-03-28: qty 1

## 2020-03-28 MED ORDER — PROPOFOL 10 MG/ML IV BOLUS
INTRAVENOUS | Status: AC
  Filled 2020-03-28: qty 20

## 2020-03-28 MED ORDER — BUPIVACAINE HCL 0.25 % IJ SOLN
INTRAMUSCULAR | Status: DC | PRN
  Administered 2020-03-28: 30 mL

## 2020-03-28 MED ORDER — 0.9 % SODIUM CHLORIDE (POUR BTL) OPTIME
TOPICAL | Status: DC | PRN
  Administered 2020-03-28: 1000 mL

## 2020-03-28 MED ORDER — CHLORHEXIDINE GLUCONATE 0.12 % MT SOLN
15.0000 mL | Freq: Once | OROMUCOSAL | Status: AC
  Administered 2020-03-28: 15 mL via OROMUCOSAL

## 2020-03-28 MED ORDER — FENTANYL CITRATE (PF) 100 MCG/2ML IJ SOLN
INTRAMUSCULAR | Status: DC | PRN
  Administered 2020-03-28: 100 ug via INTRAVENOUS

## 2020-03-28 SURGICAL SUPPLY — 45 items
BAG DECANTER FOR FLEXI CONT (MISCELLANEOUS) IMPLANT
BAG ZIPLOCK 12X15 (MISCELLANEOUS) IMPLANT
BLADE SAG 18X100X1.27 (BLADE) ×2 IMPLANT
CLSR STERI-STRIP ANTIMIC 1/2X4 (GAUZE/BANDAGES/DRESSINGS) ×2 IMPLANT
COVER PERINEAL POST (MISCELLANEOUS) ×2 IMPLANT
COVER SURGICAL LIGHT HANDLE (MISCELLANEOUS) ×2 IMPLANT
COVER WAND RF STERILE (DRAPES) IMPLANT
CUP ACET PINNACLE SECTR 50MM (Hips) ×1 IMPLANT
DECANTER SPIKE VIAL GLASS SM (MISCELLANEOUS) ×2 IMPLANT
DRAPE STERI IOBAN 125X83 (DRAPES) ×2 IMPLANT
DRAPE U-SHAPE 47X51 STRL (DRAPES) ×4 IMPLANT
DRSG AQUACEL AG ADV 3.5X10 (GAUZE/BANDAGES/DRESSINGS) ×2 IMPLANT
DURAPREP 26ML APPLICATOR (WOUND CARE) ×2 IMPLANT
ELECT REM PT RETURN 15FT ADLT (MISCELLANEOUS) ×2 IMPLANT
EVACUATOR 1/8 PVC DRAIN (DRAIN) IMPLANT
FEM STEM 12/14 TAPER SZ 4 HIP (Orthopedic Implant) ×2 IMPLANT
FEMORAL STEM 12/14 TPR SZ4 HIP (Orthopedic Implant) ×1 IMPLANT
GLOVE BIO SURGEON STRL SZ8 (GLOVE) ×4 IMPLANT
GLOVE BIOGEL PI IND STRL 8.5 (GLOVE) ×1 IMPLANT
GLOVE BIOGEL PI INDICATOR 8.5 (GLOVE) ×1
GLOVE SRG 8 PF TXTR STRL LF DI (GLOVE) ×1 IMPLANT
GLOVE SURG ENC MOIS LTX SZ6 (GLOVE) IMPLANT
GLOVE SURG ENC MOIS LTX SZ7 (GLOVE) IMPLANT
GLOVE SURG UNDER POLY LF SZ6.5 (GLOVE) ×4 IMPLANT
GLOVE SURG UNDER POLY LF SZ8 (GLOVE) ×1
GOWN STRL REUS W/TWL LRG LVL3 (GOWN DISPOSABLE) ×2 IMPLANT
GOWN STRL REUS W/TWL XL LVL3 (GOWN DISPOSABLE) ×4 IMPLANT
HEAD FEMORAL 32 CERAMIC (Hips) ×2 IMPLANT
HOLDER FOLEY CATH W/STRAP (MISCELLANEOUS) ×2 IMPLANT
KIT TURNOVER KIT A (KITS) IMPLANT
LINER MARATHON 32 50 (Hips) ×2 IMPLANT
MANIFOLD NEPTUNE II (INSTRUMENTS) ×2 IMPLANT
PACK ANTERIOR HIP CUSTOM (KITS) ×2 IMPLANT
PENCIL SMOKE EVACUATOR COATED (MISCELLANEOUS) ×2 IMPLANT
PINNACLE SECTOR CUP 50MM (Hips) ×2 IMPLANT
STRIP CLOSURE SKIN 1/2X4 (GAUZE/BANDAGES/DRESSINGS) ×4 IMPLANT
SUT ETHIBOND NAB CT1 #1 30IN (SUTURE) ×2 IMPLANT
SUT MNCRL AB 4-0 PS2 18 (SUTURE) ×2 IMPLANT
SUT STRATAFIX 0 PDS 27 VIOLET (SUTURE) ×2
SUT VIC AB 2-0 CT1 27 (SUTURE) ×2
SUT VIC AB 2-0 CT1 TAPERPNT 27 (SUTURE) ×2 IMPLANT
SUTURE STRATFX 0 PDS 27 VIOLET (SUTURE) ×1 IMPLANT
SYR 50ML LL SCALE MARK (SYRINGE) IMPLANT
TRAY FOLEY MTR SLVR 16FR STAT (SET/KITS/TRAYS/PACK) ×2 IMPLANT
TUBE SUCTION HIGH CAP CLEAR NV (SUCTIONS) ×2 IMPLANT

## 2020-03-28 NOTE — Anesthesia Postprocedure Evaluation (Signed)
Anesthesia Post Note  Patient: Felicia Ross  Procedure(s) Performed: TOTAL HIP ARTHROPLASTY ANTERIOR APPROACH (Right Hip)     Patient location during evaluation: PACU Anesthesia Type: Spinal Level of consciousness: oriented and awake and alert Pain management: pain level controlled Vital Signs Assessment: post-procedure vital signs reviewed and stable Respiratory status: spontaneous breathing, respiratory function stable and patient connected to nasal cannula oxygen Cardiovascular status: blood pressure returned to baseline and stable Postop Assessment: no headache, no backache and no apparent nausea or vomiting Anesthetic complications: no   No complications documented.  Last Vitals:  Vitals:   03/28/20 1200 03/28/20 1236  BP: (!) 122/59 132/68  Pulse: (!) 59 (!) 59  Resp: 14 16  Temp: 36.6 C (!) 36.4 C  SpO2: 100% 100%    Last Pain:  Vitals:   03/28/20 1200  TempSrc:   PainSc: 4                  Mariadelaluz [place] L Randeep Biondolillo

## 2020-03-28 NOTE — Discharge Instructions (Signed)
Frank Aluisio, MD Total Joint Specialist [facility] [address] [place], [place] [address] [phone]  ANTERIOR APPROACH TOTAL HIP REPLACEMENT POSTOPERATIVE DIRECTIONS     Hip Rehabilitation, Guidelines Following Surgery  The results of a hip operation are greatly improved after range of motion and muscle strengthening exercises. Follow all safety measures which are given to protect your hip. If any of these exercises cause increased pain or swelling in your joint, decrease the amount until you are comfortable again. Then slowly increase the exercises. Call your caregiver if you have problems or questions.   BLOOD CLOT PREVENTION . Take a 325 mg Aspirin two times a day for three weeks following surgery. Then take an 81 mg Aspirin once a day for three weeks. Then discontinue Aspirin. . You may resume your vitamins/supplements upon discharge from the hospital. . Do not take any NSAIDs (Advil, Aleve, Ibuprofen, Meloxicam, etc.) until you have discontinued the 325 mg Aspirin.  HOME CARE INSTRUCTIONS  . Remove items at home which could result in a fall. This includes throw rugs or furniture in walking pathways.   ICE to the affected hip as frequently as 20-30 minutes an hour and then as needed for pain and swelling. Continue to use ice on the hip for pain and swelling from surgery. You may notice swelling that will progress down to the foot and ankle. This is normal after surgery. Elevate the leg when you are not up walking on it.    Continue to use the breathing machine which will help keep your temperature down.  It is common for your temperature to cycle up and down following surgery, especially at night when you are not up moving around and exerting yourself.  The breathing machine keeps your lungs expanded and your temperature down.  DIET You may resume your previous home diet once your are discharged from the hospital.  DRESSING / WOUND CARE /  SHOWERING . You have an adhesive waterproof bandage over the incision. Leave this in place until your first follow-up appointment. Once you remove this you will not need to place another bandage.  . You may begin showering 3 days following surgery, but do not submerge the incision under water.  ACTIVITY . For the first 3-5 days, it is important to rest and keep the operative leg elevated. You should, as a general rule, rest for 50 minutes and walk/stretch for 10 minutes per hour. After 5 days, you may slowly increase activity as tolerated.  . Perform the exercises you were provided twice a day for about 15-20 minutes each session. Begin these 2 days following surgery. . Walk with your walker as instructed. Use the walker until you are comfortable transitioning to a cane. Walk with the cane in the opposite hand of the operative leg. You may discontinue the cane once you are comfortable and walking steadily. . Avoid periods of inactivity such as sitting longer than an hour when not asleep. This helps prevent blood clots.  . Do not drive a car for 6 weeks or until released by your surgeon.  . Do not drive while taking narcotics.  TED HOSE STOCKINGS Wear the elastic stockings on both legs for three weeks following surgery during the day. You may remove them at night while sleeping.  WEIGHT BEARING Weight bearing as tolerated with assist device (walker, cane, etc) as directed, use it as long as suggested by your surgeon or therapist, typically at least 4-6 weeks.  POSTOPERATIVE CONSTIPATION PROTOCOL Constipation -   defined medically as fewer than three stools per week and severe constipation as less than one stool per week.  One of the most common issues patients have following surgery is constipation.  Even if you have a regular bowel pattern at home, your normal regimen is likely to be disrupted due to multiple reasons following surgery.  Combination of anesthesia, postoperative narcotics, change in  appetite and fluid intake all can affect your bowels.  In order to avoid complications following surgery, here are some recommendations in order to help you during your recovery period.   Colace (docusate) - Pick up an over-the-counter form of Colace or another stool softener and take twice a day as long as you are requiring postoperative pain medications.  Take with a full glass of water daily.  If you experience loose stools or diarrhea, hold the colace until you stool forms back up.  If your symptoms do not get better within 1 week or if they get worse, check with your doctor.  Dulcolax (bisacodyl) - Pick up over-the-counter and take as directed by the product packaging as needed to assist with the movement of your bowels.  Take with a full glass of water.  Use this product as needed if not relieved by Colace only.   MiraLax (polyethylene glycol) - Pick up over-the-counter to have on hand.  MiraLax is a solution that will increase the amount of water in your bowels to assist with bowel movements.  Take as directed and can mix with a glass of water, juice, soda, coffee, or tea.  Take if you go more than two days without a movement.Do not use MiraLax more than once per day. Call your doctor if you are still constipated or irregular after using this medication for 7 days in a row.  If you continue to have problems with postoperative constipation, please contact the office for further assistance and recommendations.  If you experience "the worst abdominal pain ever" or develop nausea or vomiting, please contact the office immediatly for further recommendations for treatment.  ITCHING  If you experience itching with your medications, try taking only a single pain pill, or even half a pain pill at a time.  You can also use Benadryl over the counter for itching or also to help with sleep.   MEDICATIONS See your medication summary on the After Visit Summary that the nursing staff will review with you  prior to discharge.  You may have some home medications which will be placed on hold until you complete the course of blood thinner medication.  It is important for you to complete the blood thinner medication as prescribed by your surgeon.  Continue your approved medications as instructed at time of discharge.  PRECAUTIONS If you experience chest pain or shortness of breath - call 911 immediately for transfer to the hospital emergency department.  If you develop a fever greater that 101 F, purulent drainage from wound, increased redness or drainage from wound, foul odor from the wound/dressing, or calf pain - CONTACT YOUR SURGEON.                                                   FOLLOW-UP APPOINTMENTS Make sure you keep all of your appointments after your operation with your surgeon and caregivers. You should call the office at the above phone number and  make an appointment for approximately two weeks after the date of your surgery or on the date instructed by your surgeon outlined in the "After Visit Summary".  RANGE OF MOTION AND STRENGTHENING EXERCISES  These exercises are designed to help you keep full movement of your hip joint. Follow your caregiver's or physical therapist's instructions. Perform all exercises about fifteen times, three times per day or as directed. Exercise both hips, even if you have had only one joint replacement. These exercises can be done on a training (exercise) mat, on the floor, on a table or on a bed. Use whatever works the best and is most comfortable for you. Use music or television while you are exercising so that the exercises are a pleasant break in your day. This will make your life better with the exercises acting as a break in routine you can look forward to.  . Lying on your back, slowly slide your foot toward your buttocks, raising your knee up off the floor. Then slowly slide your foot back down until your leg is straight again.  . Lying on your back spread  your legs as far apart as you can without causing discomfort.  . Lying on your side, raise your upper leg and foot straight up from the floor as far as is comfortable. Slowly lower the leg and repeat.  . Lying on your back, tighten up the muscle in the front of your thigh (quadriceps muscles). You can do this by keeping your leg straight and trying to raise your heel off the floor. This helps strengthen the largest muscle supporting your knee.  . Lying on your back, tighten up the muscles of your buttocks both with the legs straight and with the knee bent at a comfortable angle while keeping your heel on the floor.   IF YOU ARE TRANSFERRED TO A SKILLED REHAB FACILITY If the patient is transferred to a skilled rehab facility following release from the hospital, a list of the current medications will be sent to the facility for the patient to continue.  When discharged from the skilled rehab facility, please have the facility set up the patient's Home Health Physical Therapy prior to being released. Also, the skilled facility will be responsible for providing the patient with their medications at time of release from the facility to include their pain medication, the muscle relaxants, and their blood thinner medication. If the patient is still at the rehab facility at time of the two week follow up appointment, the skilled rehab facility will also need to assist the patient in arranging follow up appointment in our office and any transportation needs.  MAKE SURE YOU:  . Understand these instructions.  . Get help right away if you are not doing well or get worse.    DENTAL ANTIBIOTICS:  In most cases prophylactic antibiotics for Dental procdeures after total joint surgery are not necessary.  Exceptions are as follows:  1. History of prior total joint infection  2. Severely immunocompromised (Organ Transplant, cancer chemotherapy, Rheumatoid biologic meds such as Humera)  3. Poorly controlled  diabetes (A1C &gt; 8.0, blood glucose over 200)  If you have one of these conditions, contact your surgeon for an antibiotic prescription, prior to your dental procedure.    Pick up stool softner and laxative for home use following surgery while on pain medications. Do not submerge incision under water. Please use good hand washing techniques while changing dressing each day. May shower starting three days after surgery. Please   use a clean towel to pat the incision dry following showers. Continue to use ice for pain and swelling after surgery. Do not use any lotions or creams on the incision until instructed by your surgeon.  

## 2020-03-28 NOTE — Anesthesia Procedure Notes (Signed)
Date/Time: 03/28/2020 8:23 AM Performed by: Sharlette Dense, CRNA Oxygen Delivery Method: Simple face mask

## 2020-03-28 NOTE — Care Plan (Signed)
Ortho Bundle Case Management Note  Patient Details  Name: Felicia Ross MRN: [id number] Date of Birth: 1946/01/13  R THA on 03-28-20 DCP:  Home with husband.  1 story home with 1 ste. DME:  No needs.  Has a RW and elevated toilets.  PT:  HEP                   DME Arranged:  N/A DME Agency:  NA  HH Arranged:  NA HH Agency:  NA  Additional Comments: Please contact me with any questions of if this plan should need to change.  Marianne Sofia, RN,CCM [facility]  [phone] 03/28/2020, 11:11 AM

## 2020-03-28 NOTE — Evaluation (Signed)
Physical Therapy Evaluation Patient Details Name: Felicia Ross MRN: [id number] DOB: 1945/09/28 Today's Date: 03/28/2020   History of Present Illness  Patient is a 75yo female s/p R THA on 03/28/2020.  Patient has PMH significant for OA of R hip, bipolar disorder, depression, hypothyroidisim, osteopenia, hypercholesteremia.  Clinical Impression  Pt is s/p R THA POD 0. Patient was modified independent with use of a cane prior to admission.  Pt tolerated session well, but was limited by increased pain. The patient displayed decreased R LE strength and was unable to complete SLR 2/2 pain and spinal block. The pt tolerated ambulation well with no observed LOB with MIN A- MIN guard for RW management. PT and pt reviewed use of IS machine during session.The pt reports that she will have help from her husband at home who is physically able to assist when needed. Pt will benefit from skilled PT to increase her independence and safety with mobility to allow discharge to the venue listed below.      Follow Up Recommendations Follow surgeon's recommendation for DC plan and follow-up therapies;Outpatient PT    Equipment Recommendations  None recommended by PT    Recommendations for Other Services       Precautions / Restrictions Precautions Precautions: Fall Restrictions Weight Bearing Restrictions: No Other Position/Activity Restrictions: WBAT      Mobility  Bed Mobility Overal bed mobility: Needs Assistance Bed Mobility: Supine to Sit     Supine to sit: Min assist     General bed mobility comments: Patient required assist to progress R LE to EOB and verbal cues for use of UE on bed rails    Transfers Overall transfer level: Needs assistance Equipment used: Rolling walker (2 wheeled) Transfers: Sit to/from Stand Sit to Stand: Min assist         General transfer comment: Patient required MIN A for sit to stand transfer with RW and verbal cues for hand  placement  Ambulation/Gait Ambulation/Gait assistance: Min assist;Min guard Gait Distance (Feet): 70 Feet Assistive device: Rolling walker (2 wheeled) Gait Pattern/deviations: Step-to pattern;Decreased stride length;Antalgic;Decreased weight shift to right Gait velocity: decreased   General Gait Details: Patient required MIN A-MIN guard with VCs for heel-toe progression, RW management, and step to pattern progression.  Stairs            Wheelchair Mobility    Modified Rankin (Stroke Patients Only)       Balance Overall balance assessment: Needs assistance Sitting-balance support: No upper extremity supported Sitting balance-Leahy Scale: Good     Standing balance support: Bilateral upper extremity supported Standing balance-Leahy Scale: Poor Standing balance comment: Patient required VCs and reassurance for WBAT through R LE. Pt required MIN assist for stability in standing following initial rise from sit to stand.                             Pertinent Vitals/Pain Pain Assessment: 0-10 Pain Score: 7  Pain Location: Rt hip Pain Descriptors / Indicators: Sore;Tender Pain Intervention(s): Limited activity within patient's tolerance;Monitored during session;Repositioned;Ice applied    Home Living Family/patient expects to be discharged to:: Private residence Living Arrangements: Spouse/significant other Available Help at Discharge: Family Type of Home: House Home Access: Stairs to enter Entrance Stairs-Rails: None (has a wall) Entrance Stairs-Number of Steps: 1 Home Layout: One level Home Equipment: Cane - single point;Walker - 2 wheels Additional Comments[place], her husband can help her out at home.  pt's daughter can help on Friday's if needed.    Prior Function Level of Independence: Independent with assistive device(s)         Comments: pt used SPC for mobility     Hand Dominance   Dominant Hand: Right    Extremity/Trunk  Assessment   Upper Extremity Assessment Upper Extremity Assessment: Overall WFL for tasks assessed    Lower Extremity Assessment Lower Extremity Assessment: RLE deficits/detail RLE Deficits / Details: decreased strength 2/2 pain and spinal block RLE: Unable to fully assess due to pain RLE Sensation: WNL RLE Coordination: decreased gross motor (Patient unable to perform SLR 2/2 pain)    Cervical / Trunk Assessment Cervical / Trunk Assessment: Normal  Communication   Communication: No difficulties  Cognition Arousal/Alertness: Awake/alert Behavior During Therapy: WFL for tasks assessed/performed Overall Cognitive Status: Within Functional Limits for tasks assessed                                        General Comments      Exercises Total Joint Exercises Ankle Circles/Pumps: AROM;Both;20 reps;Seated Quad Sets: AROM;Right;5 reps;Seated   Assessment/Plan    PT Assessment Patient needs continued PT services  PT Problem List Decreased strength;Decreased activity tolerance;Decreased balance;Decreased mobility;Decreased coordination;Pain       PT Treatment Interventions DME instruction;Gait training;Functional mobility training;Stair training;Therapeutic activities;Therapeutic exercise;Balance training;Neuromuscular re-education;Patient/family education    PT Goals (Current goals can be found in the Care Plan section)  Acute Rehab PT Goals Patient Stated Goal: regain independence PT Goal Formulation: With patient Time For Goal Achievement: 04/04/20 Potential to Achieve Goals: Good    Frequency 7X/week   Barriers to discharge        Co-evaluation               AM-PAC PT "6 Clicks" Mobility  Outcome Measure Help needed turning from your back to your side while in a flat bed without using bedrails?: A Little Help needed moving from lying on your back to sitting on the side of a flat bed without using bedrails?: A Little Help needed moving to and  from a bed to a chair (including a wheelchair)?: A Little Help needed standing up from a chair using your arms (e.g., wheelchair or bedside chair)?: A Little Help needed to walk in hospital room?: A Little Help needed climbing 3-5 steps with a railing? : A Lot 6 Click Score: 17    End of Session Equipment Utilized During Treatment: Gait belt Activity Tolerance: Patient tolerated treatment well;Patient limited by pain Patient left: in chair;with call bell/phone within reach;with chair alarm set Nurse Communication: Mobility status (increased pain level during session) PT Visit Diagnosis: Unsteadiness on feet (R26.81);Muscle weakness (generalized) (M62.81);Pain Pain - Right/Left: Right Pain - part of body: Hip    Time: 2841-3244 PT Time Calculation (min) (ACUTE ONLY): 37 min   Charges:   PT Evaluation $PT Eval Low Complexity: 1 Low PT Treatments $Gait Training: 8-22 mins        Felicia Ross, SPT  Acute rehab    Felicia Ross 03/28/2020, 3:38 PM

## 2020-03-28 NOTE — Interval H&P Note (Signed)
History and Physical Interval Note:  03/28/2020 7:08 AM  Felicia Ross  has presented today for surgery, with the diagnosis of Right hip osteoarthritis.  The various methods of treatment have been discussed with the patient and family. After consideration of risks, benefits and other options for treatment, the patient has consented to  Procedure(s) with comments: [place] (Right) - 167min as a surgical intervention.  The patient's history has been reviewed, patient examined, no change in status, stable for surgery.  I have reviewed the patient's chart and labs.  Questions were answered to the patient's satisfaction.     Pilar Plate Cystal Shannahan

## 2020-03-28 NOTE — Transfer of Care (Signed)
Immediate Anesthesia Transfer of Care Note  Patient: Felicia Ross  Procedure(s) Performed: TOTAL HIP ARTHROPLASTY ANTERIOR APPROACH (Right Hip)  Patient Location: PACU  Anesthesia Type:Spinal  Level of Consciousness: awake and alert   Airway & Oxygen Therapy: Patient Spontanous Breathing and Patient connected to face mask oxygen  Post-op Assessment: Report given to RN and Post -op Vital signs reviewed and stable  Post vital signs: Reviewed and stable  Last Vitals:  Vitals Value Taken Time  BP 114/48 03/28/20 1010  Temp    Pulse 73 03/28/20 1012  Resp 15 03/28/20 1012  SpO2 100 % 03/28/20 1012  Vitals shown include unvalidated device data.  Last Pain:  Vitals:   03/28/20 0634  TempSrc: Oral  PainSc: 8       Patients Stated Pain Goal: 4 (17/40/81 4481)  Complications: No complications documented.

## 2020-03-28 NOTE — Op Note (Signed)
OPERATIVE REPORT- TOTAL HIP ARTHROPLASTY   PREOPERATIVE DIAGNOSIS: Osteoarthritis of the Right hip.   POSTOPERATIVE DIAGNOSIS: Osteoarthritis of the Right  hip.   PROCEDURE: Right total hip arthroplasty, anterior approach.   SURGEON: Gaynelle Arabian, MD   ASSISTANT: Fenton Foy, PA-C  ANESTHESIA:  Spinal  ESTIMATED BLOOD LOSS:-250 mL    DRAINS: Hemovac x1.   COMPLICATIONS: None   CONDITION: PACU - hemodynamically stable.   BRIEF CLINICAL NOTE: Felicia Ross is a 75 y.o. female who has advanced end-  stage arthritis of their Right  hip with progressively worsening pain and  dysfunction.The patient has failed nonoperative management and presents for  total hip arthroplasty.   PROCEDURE IN DETAIL: After successful administration of spinal  anesthetic, the traction boots for the [facility] bed were placed on both  feet and the patient was placed onto the [facility] bed, boots placed into the leg  holders. The Right hip was then isolated from the perineum with plastic  drapes and prepped and draped in the usual sterile fashion. ASIS and  greater trochanter were marked and a oblique incision was made, starting  at about 1 cm lateral and 2 cm distal to the ASIS and coursing towards  the anterior cortex of the femur. The skin was cut with a 10 blade  through subcutaneous tissue to the level of the fascia overlying the  tensor fascia lata muscle. The fascia was then incised in line with the  incision at the junction of the anterior third and posterior 2/3rd. The  muscle was teased off the fascia and then the interval between the TFL  and the rectus was developed. The Hohmann retractor was then placed at  the top of the femoral neck over the capsule. The vessels overlying the  capsule were cauterized and the fat on top of the capsule was removed.  A Hohmann retractor was then placed anterior underneath the rectus  femoris to give exposure to the entire anterior capsule. A T-shaped   capsulotomy was performed. The edges were tagged and the femoral head  was identified.       Osteophytes are removed off the superior acetabulum.  The femoral neck was then cut in situ with an oscillating saw. Traction  was then applied to the left lower extremity utilizing the [facility]  traction. The femoral head was then removed. Retractors were placed  around the acetabulum and then circumferential removal of the labrum was  performed. Osteophytes were also removed. Reaming starts at 45 mm to  medialize and  Increased in 2 mm increments to 49 mm. We reamed in  approximately 40 degrees of abduction, 20 degrees anteversion. A 50 mm  pinnacle acetabular shell was then impacted in anatomic position under  fluoroscopic guidance with excellent purchase. We did not need to place  any additional dome screws. A 32 mm neutral + 4 marathon liner was then  placed into the acetabular shell.       The femoral lift was then placed along the lateral aspect of the femur  just distal to the vastus ridge. The leg was  externally rotated and capsule  was stripped off the inferior aspect of the femoral neck down to the  level of the lesser trochanter, this was done with electrocautery. The femur was lifted after this was performed. The  leg was then placed in an extended and adducted position essentially delivering the femur. We also removed the capsule superiorly and the piriformis from the piriformis  fossa to gain excellent exposure of the  proximal femur. Rongeur was used to remove some cancellous bone to get  into the lateral portion of the proximal femur for placement of the  initial starter reamer. The starter broaches was placed  the starter broach  and was shown to go down the center of the canal. Broaching  with the Actis system was then performed starting at size 0  coursing  Up to size 4. A size 4 had excellent torsional and rotational  and axial stability. The trial high offset neck was then placed   with a 32 + 1 trial head. The hip was then reduced. We confirmed that  the stem was in the canal both on AP and lateral x-rays. It also has excellent sizing. The hip was reduced with outstanding stability through full extension and full external rotation.. AP pelvis was taken and the leg lengths were measured and found to be equal. Hip was then dislocated again and the femoral head and neck removed. The  femoral broach was removed. Size 4 Actis stem with a high offset  neck was then impacted into the femur following native anteversion. Has  excellent purchase in the canal. Excellent torsional and rotational and  axial stability. It is confirmed to be in the canal on AP and lateral  fluoroscopic views. The 32 = 1 ceramic head was placed and the hip  reduced with outstanding stability. Again AP pelvis was taken and it  confirmed that the leg lengths were equal. The wound was then copiously  irrigated with saline solution and the capsule reattached and repaired  with Ethibond suture. 30 ml of .25% Bupivicaine was  injected into the capsule and into the edge of the tensor fascia lata as well as subcutaneous tissue. The fascia overlying the tensor fascia lata was then closed with a running #1 V-Loc. Subcu was closed with interrupted 2-0 Vicryl and subcuticular running 4-0 Monocryl. Incision was cleaned  and dried. Steri-Strips and a bulky sterile dressing applied. Hemovac  drain was hooked to suction and then the patient was awakened and transported to  recovery in stable condition.        Please note that a surgical assistant was a medical necessity for this procedure to perform it in a safe and expeditious manner. Assistant was necessary to provide appropriate retraction of vital neurovascular structures and to prevent femoral fracture and allow for anatomic placement of the prosthesis.  Gaynelle Arabian, M.D.

## 2020-03-29 ENCOUNTER — Encounter (HOSPITAL_COMMUNITY): Payer: Self-pay | Admitting: Orthopedic Surgery

## 2020-03-29 DIAGNOSIS — Z79899 Other long term (current) drug therapy: Secondary | ICD-10-CM | POA: Diagnosis not present

## 2020-03-29 DIAGNOSIS — M1611 Unilateral primary osteoarthritis, right hip: Secondary | ICD-10-CM | POA: Diagnosis not present

## 2020-03-29 DIAGNOSIS — E039 Hypothyroidism, unspecified: Secondary | ICD-10-CM | POA: Diagnosis not present

## 2020-03-29 LAB — CBC
HCT: 37.5 % (ref 36.0–46.0)
Hemoglobin: 12.1 g/dL (ref 12.0–15.0)
MCH: 30.5 pg (ref 26.0–34.0)
MCHC: 32.3 g/dL (ref 30.0–36.0)
MCV: 94.5 fL (ref 80.0–100.0)
Platelets: 159 10*3/uL (ref 150–400)
RBC: 3.97 MIL/uL (ref 3.87–5.11)
RDW: 12.7 % (ref 11.5–15.5)
WBC: 16 10*3/uL — ABNORMAL HIGH (ref 4.0–10.5)
nRBC: 0 % (ref 0.0–0.2)

## 2020-03-29 LAB — BASIC METABOLIC PANEL
Anion gap: 9 (ref 5–15)
BUN: 18 mg/dL (ref 8–23)
CO2: 24 mmol/L (ref 22–32)
Calcium: 8.6 mg/dL — ABNORMAL LOW (ref 8.9–10.3)
Chloride: 105 mmol/L (ref 98–111)
Creatinine, Ser: 0.94 mg/dL (ref 0.44–1.00)
GFR, Estimated: >60 mL/min (ref >60)
Glucose, Bld: 137 mg/dL — ABNORMAL HIGH (ref 70–99)
Potassium: 4.5 mmol/L (ref 3.5–5.1)
Sodium: 138 mmol/L (ref 135–145)

## 2020-03-29 MED ORDER — METHOCARBAMOL 500 MG PO TABS: 500.0000 mg | ORAL_TABLET | Freq: Four times a day (QID) | ORAL | 0 refills | Status: DC | PRN

## 2020-03-29 MED ORDER — HYDROCODONE-ACETAMINOPHEN 5-325 MG PO TABS: 1.0000 | ORAL_TABLET | Freq: Four times a day (QID) | ORAL | 0 refills | Status: DC | PRN

## 2020-03-29 MED ORDER — ASPIRIN 325 MG PO TBEC: 325.0000 mg | DELAYED_RELEASE_TABLET | Freq: Two times a day (BID) | ORAL | 0 refills | Status: AC

## 2020-03-29 NOTE — Progress Notes (Signed)
   Subjective: 1 Day Post-Op Procedure(s) (LRB): TOTAL HIP ARTHROPLASTY ANTERIOR APPROACH (Right) Patient reports pain as mild.   Patient seen in rounds by Dr. Wynelle Link. Patient is well, and has had no acute complaints or problems. No acute overnight events. Denies SOB, Chest Pain, or calf pain. Walked 70 feet with physical therapy yesterday. Foley pulled this am.  We will continue therapy today.   Objective: Vital signs in last 24 hours: Temp:  [97.5 F (36.4 C)-98.6 F (37 C)] 98.3 F (36.8 C) (01/13 0607) Pulse Rate:  [54-83] 83 (01/13 0607) Resp:  [13-20] 17 (01/13 0607) BP: (114-138)/(47-73) 121/49 (01/13 0607) SpO2:  [95 %-100 %] 97 % (01/13 0607)  Intake/Output from previous day:  Intake/Output Summary (Last 24 hours) at 03/29/2020 0733 Last data filed at 03/29/2020 0625 Gross per 24 hour  Intake 3943.71 ml  Output 2575 ml  Net 1368.71 ml     Intake/Output this shift: No intake/output data recorded.  Labs: Recent Labs    03/29/20 0353  HGB 12.1   Recent Labs    03/29/20 0353  WBC 16.0*  RBC 3.97  HCT 37.5  PLT 159   Recent Labs    03/29/20 0353  NA 138  K 4.5  CL 105  CO2 24  BUN 18  CREATININE 0.94  GLUCOSE 137*  CALCIUM 8.6*   No results for input(s): LABPT, INR in the last 72 hours.  Exam: General - Patient is Alert, Appropriate and Oriented Extremity - Neurologically intact Neurovascular intact Sensation intact distally Intact pulses distally Dorsiflexion/Plantar flexion intact Dressing - dressing C/D/I Motor Function - intact, moving foot and toes well on exam.   Past Medical History:  Diagnosis Date  . Arthritis   . Bipolar disorder ([place])   . Depression   . Hypercholesteremia   . Hypothyroidism   . Osteopenia   . Thyroid goiter     Assessment/Plan: 1 Day Post-Op Procedure(s) (LRB): TOTAL HIP ARTHROPLASTY ANTERIOR APPROACH (Right) Principal Problem:   OA (osteoarthritis) of hip  Estimated body mass index is 26.16 kg/m  as calculated from the following:   Height as of this encounter: 5\' 7"  (1.702 m).   Weight as of this encounter: 75.8 kg. Advance diet Up with therapy  DVT Prophylaxis - Aspirin Weight bearing as tolerated. Continue therapy today -- will plan for discharge today if meeting goals with physical therapy.   Patient to follow up in two weeks in clinic with Dr. Wynelle Link.   Plan is to go Home after hospital stay.  Fenton Foy, PA-C Orthopedic Surgery  03/29/2020, 7:33 AM

## 2020-03-29 NOTE — Progress Notes (Signed)
Physical Therapy Treatment Patient Details Name: Felicia Ross MRN: [id number] DOB: [birth year] Today's Date: 03/29/2020    History of Present Illness Patient is a 75yo female s/p R THA on 03/28/2020.  Patient has PMH significant for OA of R hip, bipolar disorder, depression, hypothyroidisim, osteopenia, hypercholesteremia.    PT Comments    Pt in good spirits and with marked improvement in activity tolerance this pm.  Pt with no c/o dizziness and up to ambulate in hall, negotiated stairs, reviewed car transfers and performed HEP - written instruction provided and reviewed.   Follow Up Recommendations  Follow surgeon's recommendation for DC plan and follow-up therapies;Outpatient PT     Equipment Recommendations  None recommended by PT    Recommendations for Other Services       Precautions / Restrictions Precautions Precautions: Fall Restrictions Weight Bearing Restrictions: No Other Position/Activity Restrictions: WBAT    Mobility  Bed Mobility               General bed mobility comments: Pt up in chair and requests back to same  Transfers Overall transfer level: Needs assistance Equipment used: Rolling walker (2 wheeled) Transfers: Sit to/from Stand Sit to Stand: Min guard;Supervision         General transfer comment: cues for LE management and use of UEs to self assist  Ambulation/Gait Ambulation/Gait assistance: Min guard;Supervision Gait Distance (Feet): 200 Feet Assistive device: Rolling walker (2 wheeled) Gait Pattern/deviations: Step-to pattern;Step-through pattern;Decreased step length - right;Decreased step length - left;Shuffle Gait velocity: decreased   General Gait Details: cues for sequence, posture and position from RW.   Stairs Stairs: Yes Stairs assistance: Min assist Stair Management: No rails;Step to pattern;Forwards;With walker Number of Stairs: 2 General stair comments: single step twice fwd with cues for sequence and foot/RW  placement   Wheelchair Mobility    Modified Rankin (Stroke Patients Only)       Balance Overall balance assessment: Needs assistance Sitting-balance support: No upper extremity supported Sitting balance-Leahy Scale: Good     Standing balance support: No upper extremity supported Standing balance-Leahy Scale: Fair                              Cognition Arousal/Alertness: Awake/alert Behavior During Therapy: WFL for tasks assessed/performed Overall Cognitive Status: Within Functional Limits for tasks assessed                                        Exercises Total Joint Exercises Ankle Circles/Pumps: AROM;Both;20 reps;Seated Quad Sets: AROM;Right;10 reps;Supine Heel Slides: AAROM;Right;20 reps;Supine Hip ABduction/ADduction: AAROM;Right;15 reps;Supine Long Arc Quad: AAROM;Right;10 reps;Seated    General Comments        Pertinent Vitals/Pain Pain Assessment: 0-10 Pain Score: 4  Pain Location: Rt hip Pain Descriptors / Indicators: Sore;Tender Pain Intervention(s): Limited activity within patient's tolerance;Monitored during session;Premedicated before session    Home Living                      Prior Function            PT Goals (current goals can now be found in the care plan section) Acute Rehab PT Goals Patient Stated Goal: regain independence PT Goal Formulation: With patient Time For Goal Achievement: 04/04/20 Potential to Achieve Goals: Good Progress towards PT goals: Progressing toward goals    Frequency  7X/week      PT Plan Current plan remains appropriate    Co-evaluation              AM-PAC PT "6 Clicks" Mobility   Outcome Measure  Help needed turning from your back to your side while in a flat bed without using bedrails?: A Little Help needed moving from lying on your back to sitting on the side of a flat bed without using bedrails?: A Little Help needed moving to and from a bed to a chair  (including a wheelchair)?: A Little Help needed standing up from a chair using your arms (e.g., wheelchair or bedside chair)?: A Little Help needed to walk in hospital room?: A Little Help needed climbing 3-5 steps with a railing? : A Little 6 Click Score: 18    End of Session Equipment Utilized During Treatment: Gait belt Activity Tolerance: Patient tolerated treatment well Patient left: in chair;with call bell/phone within reach;with chair alarm set Nurse Communication: Mobility status PT Visit Diagnosis: Unsteadiness on feet (R26.81);Muscle weakness (generalized) (M62.81);Pain Pain - Right/Left: Right Pain - part of body: Hip     Time: 6063-0160 PT Time Calculation (min) (ACUTE ONLY): 42 min  Charges:  $Gait Training: 8-22 mins $Therapeutic Exercise: 8-22 mins $Therapeutic Activity: 8-22 mins                     Debe Coder PT Acute Rehabilitation Services Pager [phone] Office [phone]    Felicia Ross 03/29/2020, 3:40 PM

## 2020-03-29 NOTE — Plan of Care (Signed)
Pt ready for DC home 

## 2020-03-29 NOTE — Progress Notes (Signed)
Physical Therapy Treatment Patient Details Name: Felicia Ross MRN: [id number] DOB: Dec 17, 1945 Today's Date: 03/29/2020    History of Present Illness Patient is a 75yo female s/p R THA on 03/28/2020.  Patient has PMH significant for OA of R hip, bipolar disorder, depression, hypothyroidisim, osteopenia, hypercholesteremia.    PT Comments    Pt very cooperative but ltd this am by c/o dizziness - Bp 120/62 - RN aware   Follow Up Recommendations  Follow surgeon's recommendation for DC plan and follow-up therapies;Outpatient PT     Equipment Recommendations  None recommended by PT    Recommendations for Other Services       Precautions / Restrictions Precautions Precautions: Fall Restrictions Weight Bearing Restrictions: No Other Position/Activity Restrictions: WBAT    Mobility  Bed Mobility Overal bed mobility: Needs Assistance Bed Mobility: Supine to Sit     Supine to sit: Min guard     General bed mobility comments: Increased time with cues for sequence and use of L LE to self assist  Transfers Overall transfer level: Needs assistance Equipment used: Rolling walker (2 wheeled) Transfers: Sit to/from Stand Sit to Stand: Min assist         General transfer comment: Patient required MIN A for sit to stand transfer with RW and verbal cues for hand placement  Ambulation/Gait Ambulation/Gait assistance: Min assist Gait Distance (Feet): 5 Feet Assistive device: Rolling walker (2 wheeled) Gait Pattern/deviations: Step-to pattern;Decreased stride length;Antalgic;Decreased weight shift to right Gait velocity: decreased   General Gait Details: cues for sequence, posture and position from RW.  Distance ltd by c/o dizziness - BP 120/62 - RN aware   Stairs             Wheelchair Mobility    Modified Rankin (Stroke Patients Only)       Balance Overall balance assessment: Needs assistance Sitting-balance support: No upper extremity supported Sitting  balance-Leahy Scale: Good     Standing balance support: Bilateral upper extremity supported Standing balance-Leahy Scale: Poor                              Cognition Arousal/Alertness: Awake/alert Behavior During Therapy: WFL for tasks assessed/performed Overall Cognitive Status: Within Functional Limits for tasks assessed                                        Exercises Total Joint Exercises Ankle Circles/Pumps: AROM;Both;20 reps;Seated Quad Sets: AROM;Right;10 reps;Supine Heel Slides: AAROM;Right;20 reps;Supine Hip ABduction/ADduction: AAROM;Right;15 reps;Supine Long Arc Quad: AAROM;Right;10 reps;Seated    General Comments        Pertinent Vitals/Pain Pain Assessment: 0-10 Pain Score: 4  Pain Location: Rt hip Pain Descriptors / Indicators: Sore;Tender Pain Intervention(s): Limited activity within patient's tolerance;Monitored during session;Premedicated before session;Ice applied    Home Living                      Prior Function            PT Goals (current goals can now be found in the care plan section) Acute Rehab PT Goals Patient Stated Goal: regain independence PT Goal Formulation: With patient Time For Goal Achievement: 04/04/20 Potential to Achieve Goals: Good    Frequency    7X/week      PT Plan Current plan remains appropriate    Co-evaluation  AM-PAC PT "6 Clicks" Mobility   Outcome Measure  Help needed turning from your back to your side while in a flat bed without using bedrails?: A Little Help needed moving from lying on your back to sitting on the side of a flat bed without using bedrails?: A Little Help needed moving to and from a bed to a chair (including a wheelchair)?: A Little Help needed standing up from a chair using your arms (e.g., wheelchair or bedside chair)?: A Little Help needed to walk in hospital room?: A Little Help needed climbing 3-5 steps with a railing? : A  Lot 6 Click Score: 17    End of Session Equipment Utilized During Treatment: Gait belt Activity Tolerance: Patient tolerated treatment well;Patient limited by fatigue Patient left: in chair;with call bell/phone within reach;with chair alarm set Nurse Communication: Mobility status PT Visit Diagnosis: Unsteadiness on feet (R26.81);Muscle weakness (generalized) (M62.81);Pain Pain - Right/Left: Right Pain - part of body: Hip     Time: 8250-0370 PT Time Calculation (min) (ACUTE ONLY): 33 min  Charges:  $Therapeutic Exercise: 8-22 mins $Therapeutic Activity: 8-22 mins                     Debe Coder PT Acute Rehabilitation Services Pager [phone] Office [phone]    [facility] 03/29/2020, 3:34 PM

## 2020-04-04 NOTE — Discharge Summary (Signed)
Physician Discharge Summary   Patient ID: [name] MRN: Felicia Ross:[id number] DOB/AGE: 04-14-1945 75 y.o.  Admit date: 03/28/2020 Discharge date: 03/29/2020  Primary Diagnosis: Osteoarthritis, right hip   Admission Diagnoses:  Past Medical History:  Diagnosis Date  . Arthritis   . Bipolar disorder ([place])   . Depression   . Hypercholesteremia   . Hypothyroidism   . Osteopenia   . Thyroid goiter    Discharge Diagnoses:   Principal Problem:   OA (osteoarthritis) of hip  Estimated body mass index is 26.16 kg/m as calculated from the following:   Height as of this encounter: 5\' 7"  (1.702 m).   Weight as of this encounter: 75.8 kg.  Procedure:  Procedure(s) (LRB): TOTAL HIP ARTHROPLASTY ANTERIOR APPROACH (Right)   Consults: None  HPI: Felicia Ross is a 75 y.o. female who has advanced end-stage arthritis of their Right  hip with progressively worsening pain and dysfunction.The patient has failed nonoperative management and presents for total hip arthroplasty.   Laboratory Data: Admission on 03/28/2020, Discharged on 03/29/2020  Component Date Value Ref Range Status  . ABO/RH(D) 03/28/2020    Final                   Value:A NEG Performed at [facility], [place] [address], [place], [place] [address]   . WBC 03/29/2020 16.0* 4.0 - 10.5 K/uL Final  . RBC 03/29/2020 3.97  3.87 - 5.11 MIL/uL Final  . Hemoglobin 03/29/2020 12.1  12.0 - 15.0 g/dL Final  . HCT 03/29/2020 37.5  36.0 - 46.0 % Final  . MCV 03/29/2020 94.5  80.0 - 100.0 fL Final  . MCH 03/29/2020 30.5  26.0 - 34.0 pg Final  . MCHC 03/29/2020 32.3  30.0 - 36.0 g/dL Final  . RDW 03/29/2020 12.7  11.5 - 15.5 % Final  . Platelets 03/29/2020 159  150 - 400 K/uL Final  . nRBC 03/29/2020 0.0  0.0 - 0.2 % Final   Performed at [facility], [place] [address], [place], [place] [address]  . Sodium 03/29/2020 138  135 - 145 mmol/L Final  . Potassium 03/29/2020 4.5  3.5 - 5.1 mmol/L Final  .  Chloride 03/29/2020 105  98 - 111 mmol/L Final  . CO2 03/29/2020 24  22 - 32 mmol/L Final  . Glucose, Bld 03/29/2020 137* 70 - 99 mg/dL Final   Glucose reference range applies only to samples taken after fasting for at least 8 hours.  . BUN 03/29/2020 18  8 - 23 mg/dL Final  . Creatinine, Ser 03/29/2020 0.94  0.44 - 1.00 mg/dL Final  . Calcium 03/29/2020 8.6* 8.9 - 10.3 mg/dL Final  . GFR, Estimated 03/29/2020 >60  >60 mL/min Final   Comment: (NOTE) Calculated using the CKD-EPI Creatinine Equation (2021)   . Anion gap 03/29/2020 9  5 - 15 Final   Performed at [facility], [place] [address] [place], [place] [address]  Hospital Outpatient Visit on 03/24/2020  Component Date Value Ref Range Status  . SARS Coronavirus 2 03/24/2020 NEGATIVE  NEGATIVE Final   Comment: (NOTE) SARS-CoV-2 target nucleic acids are NOT DETECTED.  The SARS-CoV-2 RNA is generally detectable in upper and lower respiratory specimens during the acute phase of infection. Negative results do not preclude SARS-CoV-2 infection, do not rule out co-infections with other pathogens, and should not be used as the sole basis for treatment or other patient management decisions. Negative results must be combined with clinical observations, patient  history, and epidemiological information. The expected result is Negative.  Fact Sheet for Patients: HairSlick.nohttps://www.fda.gov/media/138098/download  Fact Sheet for Healthcare Providers: quierodirigir.comhttps://www.fda.gov/media/138095/download  This test is not yet approved or cleared by the [place] FDA and  has been authorized for detection and/or diagnosis of SARS-CoV-2 by FDA under an Emergency Use Authorization (EUA). This EUA will remain  in effect (meaning this test can be used) for the duration of the COVID-19 declaration under Se                          ction 564(b)(1) of the Act, 21 U.S.C. section 360bbb-3(b)(1), unless the authorization is terminated or revoked  sooner.  Performed at [facility], [address] [place], [place] [address]   Hospital Outpatient Visit on 03/20/2020  Component Date Value Ref Range Status  . WBC 03/20/2020 6.7  4.0 - 10.5 K/uL Final  . RBC 03/20/2020 5.05  3.87 - 5.11 MIL/uL Final  . Hemoglobin 03/20/2020 15.2* 12.0 - 15.0 g/dL Final  . HCT 60/45/409801/06/2020 47.7* 36.0 - 46.0 % Final  . MCV 03/20/2020 94.5  80.0 - 100.0 fL Final  . MCH 03/20/2020 30.1  26.0 - 34.0 pg Final  . MCHC 03/20/2020 31.9  30.0 - 36.0 g/dL Final  . RDW 11/91/478201/06/2020 13.0  11.5 - 15.5 % Final  . Platelets 03/20/2020 174  150 - 400 K/uL Final  . nRBC 03/20/2020 0.0  0.0 - 0.2 % Final   Performed at [facility], [address] [place], [place] [address]  . Sodium 03/20/2020 140  135 - 145 mmol/L Final  . Potassium 03/20/2020 4.4  3.5 - 5.1 mmol/L Final  . Chloride 03/20/2020 102  98 - 111 mmol/L Final  . CO2 03/20/2020 27  22 - 32 mmol/L Final  . Glucose, Bld 03/20/2020 103* 70 - 99 mg/dL Final   Glucose reference range applies only to samples taken after fasting for at least 8 hours.  . BUN 03/20/2020 19  8 - 23 mg/dL Final  . Creatinine, Ser 03/20/2020 0.92  0.44 - 1.00 mg/dL Final  . Calcium 30/86/578401/06/2020 9.6  8.9 - 10.3 mg/dL Final  . Total Protein 03/20/2020 7.7  6.5 - 8.1 g/dL Final  . Albumin 69/62/952801/06/2020 4.5  3.5 - 5.0 g/dL Final  . AST 41/32/440101/06/2020 23  15 - 41 U/L Final  . ALT 03/20/2020 23  0 - 44 U/L Final  . Alkaline Phosphatase 03/20/2020 70  38 - 126 U/L Final  . Total Bilirubin 03/20/2020 0.9  0.3 - 1.2 mg/dL Final  . GFR, Estimated 03/20/2020 >60  >60 mL/min Final   Comment: (NOTE) Calculated using the CKD-EPI Creatinine Equation (2021)   . Anion gap 03/20/2020 11  5 - 15 Final   Performed at [facility], [address] [place], [place] [address]  . Prothrombin Time 03/20/2020 13.9  11.4 - 15.2 seconds Final  . INR 03/20/2020 1.1  0.8 - 1.2 Final   Comment: (NOTE) INR goal varies  based on device and disease states. Performed at [facility], [address] [place], [place] [address]   . aPTT 03/20/2020 28  24 - 36 seconds Final   Performed at [facility], [address] [place], [place] [address]  . ABO/RH(D) 03/20/2020 A NEG   Final  . Antibody Screen 03/20/2020 NEG   Final  . Sample Expiration 03/20/2020 03/31/2020,2359   Final  . Extend  sample reason 03/20/2020    Final                   Value:NO TRANSFUSIONS OR PREGNANCY IN THE PAST 3 MONTHS Performed at [facility], [address] [place], [place] [address]   . MRSA, PCR 03/20/2020 NEGATIVE  NEGATIVE Final  . Staphylococcus aureus 03/20/2020 NEGATIVE  NEGATIVE Final   Comment: (NOTE) The Xpert SA Assay (FDA approved for NASAL specimens in patients 74 years of age and older), is one component of a comprehensive surveillance program. It is not intended to diagnose infection nor to guide or monitor treatment. Performed at [facility], [address] [place], [place] [address]   Office Visit on 03/06/2020  Component Date Value Ref Range Status  . WBC 03/06/2020 5.7  4.0 - 10.5 K/uL Final  . RBC 03/06/2020 4.77  3.87 - 5.11 Mil/uL Final  . Platelets 03/06/2020 197.0  150.0 - 400.0 K/uL Final  . Hemoglobin 03/06/2020 14.4  12.0 - 15.0 g/dL Final  . HCT 09/81/191412/21/2021 43.7  36.0 - 46.0 % Final  . MCV 03/06/2020 91.5  78.0 - 100.0 fl Final  . MCHC 03/06/2020 33.0  30.0 - 36.0 g/dL Final  . RDW 78/29/562112/21/2021 13.8  11.5 - 15.5 % Final  . Sodium 03/06/2020 138  135 - 145 mEq/L Final  . Potassium 03/06/2020 4.3  3.5 - 5.1 mEq/L Final  . Chloride 03/06/2020 102  96 - 112 mEq/L Final  . CO2 03/06/2020 33* 19 - 32 mEq/L Final  . Glucose, Bld 03/06/2020 97  70 - 99 mg/dL Final  . BUN 30/86/578412/21/2021 22  6 - 23 mg/dL Final  . Creatinine, Ser 03/06/2020 1.00  0.40 - 1.20 mg/dL Final  . Total Bilirubin 03/06/2020 0.9  0.2 - 1.2 mg/dL Final  .  Alkaline Phosphatase 03/06/2020 72  39 - 117 U/L Final  . AST 03/06/2020 22  0 - 37 U/L Final  . ALT 03/06/2020 20  0 - 35 U/L Final  . Total Protein 03/06/2020 7.5  6.0 - 8.3 g/dL Final  . Albumin 69/62/952812/21/2021 4.5  3.5 - 5.2 g/dL Final  . GFR 41/32/440112/21/2021 56.05* >60.00 mL/min Final   Calculated using the CKD-EPI Creatinine Equation (2021)  . Calcium 03/06/2020 9.7  8.4 - 10.5 mg/dL Final  . Albumin 02/72/536612/21/2021 4.5  3.5 - 5.2 g/dL Final  . INR 44/03/474212/21/2021 1.0  0.8 - 1.0 ratio Final  . Prothrombin Time 03/06/2020 11.3  9.6 - 13.1 sec Final  . Hgb A1c MFr Bld 03/06/2020 5.6  4.6 - 6.5 % Final   Glycemic Control Guidelines for People with Diabetes:Non Diabetic:  <6%Goal of Therapy: <7%Additional Action Suggested:  >8%   . Color, Urine 03/06/2020 YELLOW  Yellow;Lt. Yellow;Straw;Dark Yellow;Amber;Green;Red;Brown Final  . APPearance 03/06/2020 CLEAR  Clear;Turbid;Slightly Cloudy;Cloudy Final  . Specific Gravity, Urine 03/06/2020 1.020  1.000 - 1.030 Final  . pH 03/06/2020 7.0  5.0 - 8.0 Final  . Total Protein, Urine 03/06/2020 NEGATIVE  Negative Final  . Urine Glucose 03/06/2020 NEGATIVE  Negative Final  . Ketones, ur 03/06/2020 NEGATIVE  Negative Final  . Bilirubin Urine 03/06/2020 NEGATIVE  Negative Final  . Hgb urine dipstick 03/06/2020 NEGATIVE  Negative Final  . Urobilinogen, UA 03/06/2020 0.2  0.0 - 1.0 Final  . Leukocytes,Ua 03/06/2020 NEGATIVE  Negative Final  . Nitrite 03/06/2020 NEGATIVE  Negative Final  . WBC, UA 03/06/2020 none seen  0-2/hpf Final  . RBC / HPF 03/06/2020 0-2/hpf  0-2/hpf Final  . Squamous Epithelial / LPF 03/06/2020 Rare(0-4/hpf)  Rare(0-4/hpf) Final     X-Rays:DG Pelvis Portable  Result Date: 03/28/2020 CLINICAL DATA:  Status post right hip replacement today. EXAM: PORTABLE PELVIS 1-2 VIEWS COMPARISON:  Intraoperative imaging earlier today. FINDINGS: Right total hip arthroplasty is in place. There is some gas in the soft tissues from surgery. No acute or focal  abnormality. IMPRESSION: Status post right hip replacement.  No acute abnormality. Electronically Signed   By: Inge Rise M.D.   On: 03/28/2020 11:25   DG C-Arm 1-60 Min-No Report  Result Date: 03/28/2020 Fluoroscopy was utilized by the requesting physician.  No radiographic interpretation.   DG HIP OPERATIVE UNILAT WITH PELVIS RIGHT  Result Date: 03/28/2020 CLINICAL DATA:  75 year old female with a history if surgery EXAM: OPERATIVE RIGHT HIP (WITH PELVIS IF PERFORMED) 2 VIEWS TECHNIQUE: Fluoroscopic spot image(s) were submitted for interpretation post-operatively. COMPARISON:  06/16/2019 FINDINGS: Intraoperative fluoroscopic spot images. Surgical changes of right hip arthroplasty with alignment of the components. IMPRESSION: Intraoperative fluoroscopic spot images of right hip arthroplasty, without complicating features. Please refer to the dictated operative report for full details of intraoperative findings and procedure. Electronically Signed   By: Corrie Mckusick D.O.   On: 03/28/2020 09:52    EKG: Orders placed or performed in visit on 03/06/20  . EKG 12-Lead     Hospital Course: Felicia Ross is a 75 y.o. who was admitted to [facility]. They were brought to the operating room on 03/28/2020 and underwent Procedure(s): [place].  Patient tolerated the procedure well and was later transferred to the recovery room and then to the orthopaedic floor for postoperative care. They were given PO and IV analgesics for pain control following their surgery. They were given 24 hours of postoperative antibiotics of  Anti-infectives (From admission, onward)   Start     Dose/Rate Route Frequency Ordered Stop   03/28/20 1400  ceFAZolin (ANCEF) IVPB 2g/100 mL premix        2 g 200 mL/hr over 30 Minutes Intravenous Every 6 hours 03/28/20 1234 03/28/20 2019   03/28/20 0630  ceFAZolin (ANCEF) IVPB 2g/100 mL premix        2 g 200 mL/hr over 30 Minutes  Intravenous On call to O.R. 03/28/20 [id number] 03/28/20 0835     and started on DVT prophylaxis in the form of Aspirin.   PT and OT were ordered for total joint protocol. Discharge planning consulted to help with postop disposition and equipment needs.  Patient had a good night on the evening of surgery. They started to get up OOB with therapy on POD #0. Pt was seen during rounds and was ready to go home pending progress with therapy. She worked with therapy on POD #1 and was meeting her goals. Pt was discharged to home later that day in stable condition.  Diet: Regular diet Activity: WBAT Follow-up: in 2 weeks Disposition: Home with HEP Discharged Condition: stable   Discharge Instructions    Call MD / Call 911   Complete by: As directed    If you experience chest pain or shortness of breath, CALL 911 and be transported to the hospital emergency room.  If you develope a fever above 101 F, pus (white drainage) or increased drainage or redness at the wound, or calf pain, call your surgeon's office.   Change dressing   Complete by: As directed    You have an adhesive waterproof bandage over the  incision. Leave this in place until your first follow-up appointment. Once you remove this you will not need to place another bandage.   Constipation Prevention   Complete by: As directed    Drink plenty of fluids.  Prune juice may be helpful.  You may use a stool softener, such as Colace (over the counter) 100 mg twice a day.  Use MiraLax (over the counter) for constipation as needed.   Diet - low sodium heart healthy   Complete by: As directed    Do not sit on low chairs, stoools or toilet seats, as it may be difficult to get up from low surfaces   Complete by: As directed    Driving restrictions   Complete by: As directed    No driving for two weeks   TED hose   Complete by: As directed    Use stockings (TED hose) for three weeks on both leg(s).  You may remove them at night for sleeping.   Weight  bearing as tolerated   Complete by: As directed      Allergies as of 03/29/2020      Reactions   Fluzone [influenza Virus Vaccine] Hives   High dose only---hives, itchy      Medication List    TAKE these medications   aspirin 325 MG EC tablet Take 1 tablet (325 mg total) by mouth 2 (two) times daily for 20 days. Then take one 81 mg aspirin once a day for three weeks. Then discontinue aspirin.   Fish Oil 1200 MG Caps Take 1,200 mg by mouth 2 (two) times daily.   HYDROcodone-acetaminophen 5-325 MG tablet Commonly known as: NORCO/VICODIN Take 1-2 tablets by mouth every 6 (six) hours as needed for moderate pain (pain score 4-6).   levothyroxine 112 MCG tablet Commonly known as: SYNTHROID TAKE 1 TABLET EVERY DAY What changed: when to take this   lithium carbonate 300 MG CR tablet Commonly known as: LITHOBID Take 1 tablet (300 mg total) by mouth 2 (two) times daily.   methocarbamol 500 MG tablet Commonly known as: ROBAXIN Take 1 tablet (500 mg total) by mouth every 6 (six) hours as needed for muscle spasms.   mirtazapine 15 MG tablet Commonly known as: REMERON Take 1 tablet (15 mg total) by mouth at bedtime.   multivitamin tablet Take 1 tablet by mouth daily.   simvastatin 10 MG tablet Commonly known as: ZOCOR Take 1 tablet (10 mg total) by mouth at bedtime.   Vitamin D 50 MCG (2000 UT) Caps Take 1 capsule by mouth daily.            Discharge Care Instructions  (From admission, onward)         Start     Ordered   03/29/20 0000  Weight bearing as tolerated        03/29/20 0745   03/29/20 0000  Change dressing       Comments: You have an adhesive waterproof bandage over the incision. Leave this in place until your first follow-up appointment. Once you remove this you will not need to place another bandage.   03/29/20 0745          Follow-up Information    Gaynelle Arabian, MD. Go on 04/10/2020.   Specialty: Orthopedic Surgery Why: You are scheduled for a  post-operative appointment on 04-10-20 at 1:00 pm.  Contact information: [address] [place] [address] [phone]  Signed: Theresa Duty, PA-C Orthopedic Surgery 04/04/2020, 10:12 AM

## 2020-04-09 DIAGNOSIS — Z96641 Presence of right artificial hip joint: Secondary | ICD-10-CM | POA: Insufficient documentation

## 2020-05-01 DIAGNOSIS — Z471 Aftercare following joint replacement surgery: Secondary | ICD-10-CM | POA: Diagnosis not present

## 2020-05-01 DIAGNOSIS — Z96641 Presence of right artificial hip joint: Secondary | ICD-10-CM | POA: Diagnosis not present

## 2020-05-23 ENCOUNTER — Other Ambulatory Visit: Payer: Self-pay | Admitting: Internal Medicine

## 2020-05-23 DIAGNOSIS — F3170 Bipolar disorder, currently in remission, most recent episode unspecified: Secondary | ICD-10-CM

## 2020-05-24 NOTE — Telephone Encounter (Signed)
Pt has upcoming appt but will run out as this is to mail order.Marland KitchenMarland Kitchen

## 2020-05-25 ENCOUNTER — Other Ambulatory Visit: Payer: Self-pay | Admitting: Internal Medicine

## 2020-05-25 DIAGNOSIS — E7800 Pure hypercholesterolemia, unspecified: Secondary | ICD-10-CM

## 2020-05-31 ENCOUNTER — Other Ambulatory Visit: Payer: Self-pay | Admitting: Internal Medicine

## 2020-05-31 DIAGNOSIS — F3170 Bipolar disorder, currently in remission, most recent episode unspecified: Secondary | ICD-10-CM

## 2020-06-13 DIAGNOSIS — Z0100 Encounter for examination of eyes and vision without abnormal findings: Secondary | ICD-10-CM | POA: Diagnosis not present

## 2020-06-13 DIAGNOSIS — H2513 Age-related nuclear cataract, bilateral: Secondary | ICD-10-CM | POA: Diagnosis not present

## 2020-06-18 ENCOUNTER — Encounter: Payer: Self-pay | Admitting: Internal Medicine

## 2020-06-18 ENCOUNTER — Other Ambulatory Visit: Payer: Self-pay

## 2020-06-18 ENCOUNTER — Ambulatory Visit (INDEPENDENT_AMBULATORY_CARE_PROVIDER_SITE_OTHER): Payer: Medicare HMO | Admitting: Internal Medicine

## 2020-06-18 VITALS — BP 138/80 | HR 67 | Temp 96.9°F | Ht 66.5 in | Wt 167.0 lb

## 2020-06-18 DIAGNOSIS — E7800 Pure hypercholesterolemia, unspecified: Secondary | ICD-10-CM | POA: Diagnosis not present

## 2020-06-18 DIAGNOSIS — Z5181 Encounter for therapeutic drug level monitoring: Secondary | ICD-10-CM

## 2020-06-18 DIAGNOSIS — E782 Mixed hyperlipidemia: Secondary | ICD-10-CM | POA: Diagnosis not present

## 2020-06-18 DIAGNOSIS — E890 Postprocedural hypothyroidism: Secondary | ICD-10-CM | POA: Diagnosis not present

## 2020-06-18 DIAGNOSIS — M8589 Other specified disorders of bone density and structure, multiple sites: Secondary | ICD-10-CM | POA: Diagnosis not present

## 2020-06-18 DIAGNOSIS — Z0000 Encounter for general adult medical examination without abnormal findings: Secondary | ICD-10-CM | POA: Diagnosis not present

## 2020-06-18 DIAGNOSIS — M1611 Unilateral primary osteoarthritis, right hip: Secondary | ICD-10-CM | POA: Diagnosis not present

## 2020-06-18 DIAGNOSIS — F3110 Bipolar disorder, current episode manic without psychotic features, unspecified: Secondary | ICD-10-CM | POA: Diagnosis not present

## 2020-06-18 LAB — CBC
HCT: 42.9 % (ref 36.0–46.0)
Hemoglobin: 14.1 g/dL (ref 12.0–15.0)
MCHC: 32.9 g/dL (ref 30.0–36.0)
MCV: 89.4 fl (ref 78.0–100.0)
Platelets: 188 10*3/uL (ref 150.0–400.0)
RBC: 4.8 Mil/uL (ref 3.87–5.11)
RDW: 13.7 % (ref 11.5–15.5)
WBC: 6.2 10*3/uL (ref 4.0–10.5)

## 2020-06-18 LAB — LIPID PANEL
Cholesterol: 182 mg/dL (ref 0–200)
HDL: 58.9 mg/dL (ref >39.00)
LDL Cholesterol: 90 mg/dL (ref 0–99)
NonHDL: 122.96
Total CHOL/HDL Ratio: 3
Triglycerides: 165 mg/dL — ABNORMAL HIGH (ref 0.0–149.0)
VLDL: 33 mg/dL (ref 0.0–40.0)

## 2020-06-18 LAB — COMPREHENSIVE METABOLIC PANEL
ALT: 16 U/L (ref 0–35)
AST: 17 U/L (ref 0–37)
Albumin: 4.3 g/dL (ref 3.5–5.2)
Alkaline Phosphatase: 86 U/L (ref 39–117)
BUN: 15 mg/dL (ref 6–23)
CO2: 33 mEq/L — ABNORMAL HIGH (ref 19–32)
Calcium: 9.7 mg/dL (ref 8.4–10.5)
Chloride: 102 mEq/L (ref 96–112)
Creatinine, Ser: 0.85 mg/dL (ref 0.40–1.20)
GFR: 67.99 mL/min (ref >60.00)
Glucose, Bld: 98 mg/dL (ref 70–99)
Potassium: 4.4 mEq/L (ref 3.5–5.1)
Sodium: 140 mEq/L (ref 135–145)
Total Bilirubin: 0.7 mg/dL (ref 0.2–1.2)
Total Protein: 7 g/dL (ref 6.0–8.3)

## 2020-06-18 LAB — TSH: TSH: 0.74 u[IU]/mL (ref 0.35–4.50)

## 2020-06-18 LAB — T4, FREE: Free T4: 1.12 ng/dL (ref 0.60–1.60)

## 2020-06-18 NOTE — Patient Instructions (Signed)

## 2020-06-18 NOTE — Assessment & Plan Note (Signed)
TSH and free T4 today We will adjust Levothyroxine if needed based on labs, will need refill

## 2020-06-18 NOTE — Assessment & Plan Note (Signed)
Continue Calcium and Vitamin D daily Encouraged daily weightbearing exercise.

## 2020-06-18 NOTE — Assessment & Plan Note (Signed)
C-Met and lipid profile today Encouraged her to consume a low-fat diet Continue Simvastatin and Fish Oil, will need refill pending labs

## 2020-06-18 NOTE — Progress Notes (Signed)
HPI:  Patient presents the clinic today for her subsequent annual Medicare wellness exam.  She is also due to follow-up chronic conditions.  Bipolar Depression: Managed on  Lithium and Mirtazapine.  She no longer follows with psychiatry.  She is not currently seeing a therapist.  She denies anxiety, SI/HI.  HLD: Her last LDL was 87, triglycerides 178, 06/2019.  She denies myalgias on Simvastatin and Fish Oil.  She tries to consume a low-fat diet.  Osteopenia: She takes Calcium and Vitamin D OTC daily.  She tries to get weightbearing exercise and daily.  Bone density from 08/2018 reviewed.  Hypothyroidism: She denies any issues on her current dose of Levothyroxine.  She does not follow with endocrinology.  OA: Mainly in her right hip, s/p right hip surgery 03/2020. Pain is completely resolved.  Past Medical History:  Diagnosis Date  . Arthritis   . Bipolar disorder ([place])   . Depression   . Hypercholesteremia   . Hypothyroidism   . Osteopenia   . Thyroid goiter     Current Outpatient Medications  Medication Sig Dispense Refill  . Cholecalciferol (VITAMIN D) 2000 units CAPS Take 1 capsule by mouth daily.    Marland Kitchen HYDROcodone-acetaminophen (NORCO/VICODIN) 5-325 MG tablet Take 1-2 tablets by mouth every 6 (six) hours as needed for moderate pain (pain score 4-6). 42 tablet 0  . levothyroxine (SYNTHROID) 112 MCG tablet TAKE 1 TABLET EVERY DAY (Patient taking differently: Take 112 mcg by mouth daily before breakfast.) 90 tablet 2  . lithium carbonate (LITHOBID) 300 MG CR tablet TAKE 1 TABLET TWICE DAILY 180 tablet 0  . methocarbamol (ROBAXIN) 500 MG tablet Take 1 tablet (500 mg total) by mouth every 6 (six) hours as needed for muscle spasms. 40 tablet 0  . mirtazapine (REMERON) 15 MG tablet TAKE 1 TABLET AT BEDTIME. 90 tablet 0  . Multiple Vitamin (MULTIVITAMIN) tablet Take 1 tablet by mouth daily.    . Omega-3 Fatty Acids (FISH OIL) 1200 MG CAPS Take 1,200 mg by mouth 2 (two) times daily.     .  simvastatin (ZOCOR) 10 MG tablet TAKE 1 TABLET AT BEDTIME. 90 tablet 0   No current facility-administered medications for this visit.    Allergies  Allergen Reactions  . Fluzone [Influenza Virus Vaccine] Hives    High dose only---hives, itchy    Family History  Problem Relation Age of Onset  . Ovarian cancer Mother   . Hypertension Mother   . Heart attack Father   . Hypertension Brother   . Breast cancer Maternal Aunt   . Lung cancer Paternal Aunt   . Breast cancer Paternal Aunt   . Hypertension Other   . Breast cancer Cousin     Social History   Socioeconomic History  . Marital status: Married    Spouse name: Lewis   . Number of children: 3  . Years of education: College  . Highest education level: Not on file  Occupational History  . Occupation: Part-time  Tobacco Use  . Smoking status: Never Smoker  . Smokeless tobacco: Never Used  Vaping Use  . Vaping Use: Never used  Substance and Sexual Activity  . Alcohol use: No    Alcohol/week: 0.0 standard drinks  . Drug use: No  . Sexual activity: Never  Other Topics Concern  . Not on file  Social History Narrative  . Not on file   Social Determinants of Health   Financial Resource Strain: Not on file  Food Insecurity: Not on file  Transportation Needs: Not on file  Physical Activity: Not on file  Stress: Not on file  Social Connections: Not on file  Intimate Partner Violence: Not on file    Hospitiliaztions: None  Health Maintenance:    Flu: 11/2019  Tetanus: 01/2005  Pneumovax: 04/2012  Prevnar: 05/2013  Zostavax: 03/2007  Shingrix: 05/2018, 11/2018  Mammogram: 08/2019  Pap Smear: 04/2016  Bone Density: 08/2018  Colon Screening: 06/2019, Cologuard  Eye Doctor: annually  Dental Exam: biannually   Providers:   PCP: Webb Silversmith, NP   Orthopedist: Dr. Maureen Ralphs  Dermatologist: Dr. Kathleen Argue   I have personally reviewed and have noted:  1. The patient's medical and social history 2. Their use of  alcohol, tobacco or illicit drugs 3. Their current medications and supplements 4. The patient's functional ability including ADL's, fall risks, home safety risks and hearing or visual impairment. 5. Diet and physical activities 6. Evidence for depression or mood disorder  Subjective:   Review of Systems:   Constitutional: Denies fever, malaise, fatigue, headache or abrupt weight changes.  HEENT: Denies eye pain, eye redness, ear pain, ringing in the ears, wax buildup, runny nose, nasal congestion, bloody nose, or sore throat. Respiratory: Denies difficulty breathing, shortness of breath, cough or sputum production.   Cardiovascular: Denies chest pain, chest tightness, palpitations or swelling in the hands or feet.  Gastrointestinal: Denies abdominal pain, bloating, constipation, diarrhea or blood in the stool.  GU: Denies urgency, frequency, pain with urination, burning sensation, blood in urine, odor or discharge. Musculoskeletal:  Denies decrease in range of motion, difficulty with gait, muscle pain or joint pain or swelling.  Skin: Denies redness, rashes, lesions or ulcercations.  Neurological: Denies dizziness, difficulty with memory, difficulty with speech or problems with balance and coordination.  Psych: Pt reports a history of depression. Denies anxiety, SI/HI.  No other specific complaints in a complete review of systems (except as listed in HPI above).  Objective:  PE:  BP 138/80   Pulse 67   Temp (!) 96.9 F (36.1 C) (Temporal)   Ht 5' 6.5" (1.689 m)   Wt 167 lb (75.8 kg)   SpO2 98%   BMI 26.55 kg/m    Wt Readings from Last 3 Encounters:  03/28/20 167 lb (75.8 kg)  03/20/20 167 lb (75.8 kg)  03/06/20 169 lb (76.7 kg)    General: Appears herstated age, well developed, well nourished in NAD. Skin: Warm, dry and intact. No rashes noted. HEENT: Head: normal shape and size; Eyes: sclera white, no icterus, conjunctiva pink, PERRLA and EOMs intact;  Neck: Neck  supple, trachea midline. No masses, lumps present.  Cardiovascular: Normal rate and rhythm. S1,S2 noted.  No murmur, rubs or gallops noted. No JVD or BLE edema. No carotid bruits noted. Pulmonary/Chest: Normal effort and positive vesicular breath sounds. No respiratory distress. No wheezes, rales or ronchi noted.  Abdomen: Soft and nontender. Normal bowel sounds. No distention or masses noted. Liver, spleen and kidneys non palpable. Musculoskeletal:  Strength 5/5 BUE/BLE. No signs of joint swelling.  Neurological: Alert and oriented. Cranial nerves II-XII grossly intact. Coordination normal.  Psychiatric: Mood and affect normal. Behavior is normal. Judgment and thought content normal.     BMET    Component Value Date/Time   NA 138 03/29/2020 0353   NA 142 05/02/2015 1103   K 4.5 03/29/2020 0353   CL 105 03/29/2020 0353   CO2 24 03/29/2020 0353   GLUCOSE 137 (H) 03/29/2020 0353   BUN 18 03/29/2020 0353  BUN 21 05/02/2015 1103   CREATININE 0.94 03/29/2020 0353   CALCIUM 8.6 (L) 03/29/2020 0353   GFRNONAA >60 03/29/2020 0353   GFRAA 76 05/02/2015 1103    Lipid Panel     Component Value Date/Time   CHOL 174 06/16/2019 0902   CHOL 254 (H) 05/02/2015 1103   TRIG 178.0 (H) 06/16/2019 0902   HDL 51.20 06/16/2019 0902   HDL 62 05/02/2015 1103   CHOLHDL 3 06/16/2019 0902   VLDL 35.6 06/16/2019 0902   LDLCALC 87 06/16/2019 0902   LDLCALC 160 (H) 05/02/2015 1103    CBC    Component Value Date/Time   WBC 16.0 (H) 03/29/2020 0353   RBC 3.97 03/29/2020 0353   HGB 12.1 03/29/2020 0353   HGB 14.9 05/02/2015 1103   HCT 37.5 03/29/2020 0353   HCT 44.7 05/02/2015 1103   PLT 159 03/29/2020 0353   PLT 209 05/02/2015 1103   MCV 94.5 03/29/2020 0353   MCV 90 05/02/2015 1103   MCH 30.5 03/29/2020 0353   MCHC 32.3 03/29/2020 0353   RDW 12.7 03/29/2020 0353   RDW 13.7 05/02/2015 1103   LYMPHSABS 2.5 05/02/2015 1103   EOSABS 0.1 05/02/2015 1103   BASOSABS 0.0 05/02/2015 1103     Hgb A1C Lab Results  Component Value Date   HGBA1C 5.6 03/06/2020      Assessment and Plan:   Medicare Annual Wellness Visit:  Diet: She does eat meat. She consumes fruits and veggies. She tries to avoid fried foods. She drinks mostly water, coffee. Physical activity: Walking Depression/mood screen: Chronic, PHQ 9 score of 1 Hearing: Intact to whispered voice Visual acuity: Grossly normal, performs annual eye exam  ADLs: Capable Fall risk: None Home safety: Good Cognitive evaluation: Intact to orientation, naming, recall and repetition EOL planning: Adv directives, full code/ I agree  Preventative Medicine: Flu shot UTD.  Advised her if she gets better or cut she will need to go get a Tetanus vaccine.  Pneumovax, Prevnar, Zostavax and Shingrix UTD.  Mammogram scheduled.  She no longer needs Pap smears but if she would like one she will be due in 2023.  Bone density UTD.  Colon screening UTD.  Encouraged her to consume a balanced diet and exercise regimen.  Advised her to see an eye doctor and dentist annually.  Will check CBC, c-Met, TSH, free T4, lipid profile today.   Next appointment: 1 year, Medicare Wellness Exam   Webb Silversmith, NP This visit occurred during the SARS-CoV-2 public health emergency.  Safety protocols were in place, including screening questions prior to the visit, additional usage of staff PPE, and extensive cleaning of exam room while observing appropriate contact time as indicated for disinfecting solutions.

## 2020-06-18 NOTE — Assessment & Plan Note (Signed)
Resolved status post hip replacement She will follow with Dr. Maureen Ralphs yearly

## 2020-06-18 NOTE — Assessment & Plan Note (Signed)
C-Met and lithium level today Continue Lithium and Mirtazapine Monitor

## 2020-06-19 LAB — LITHIUM LEVEL: Lithium Lvl: 0.5 mmol/L — ABNORMAL LOW (ref 0.6–1.2)

## 2020-06-20 DIAGNOSIS — Z0100 Encounter for examination of eyes and vision without abnormal findings: Secondary | ICD-10-CM | POA: Diagnosis not present

## 2020-07-19 DIAGNOSIS — C44712 Basal cell carcinoma of skin of right lower limb, including hip: Secondary | ICD-10-CM | POA: Diagnosis not present

## 2020-07-19 DIAGNOSIS — L821 Other seborrheic keratosis: Secondary | ICD-10-CM | POA: Diagnosis not present

## 2020-07-19 DIAGNOSIS — D485 Neoplasm of uncertain behavior of skin: Secondary | ICD-10-CM | POA: Diagnosis not present

## 2020-07-19 DIAGNOSIS — D225 Melanocytic nevi of trunk: Secondary | ICD-10-CM | POA: Diagnosis not present

## 2020-07-19 DIAGNOSIS — Z08 Encounter for follow-up examination after completed treatment for malignant neoplasm: Secondary | ICD-10-CM | POA: Diagnosis not present

## 2020-07-19 DIAGNOSIS — Z85828 Personal history of other malignant neoplasm of skin: Secondary | ICD-10-CM | POA: Diagnosis not present

## 2020-07-26 DIAGNOSIS — C44712 Basal cell carcinoma of skin of right lower limb, including hip: Secondary | ICD-10-CM | POA: Diagnosis not present

## 2020-08-08 ENCOUNTER — Other Ambulatory Visit: Payer: Self-pay | Admitting: Internal Medicine

## 2020-08-08 DIAGNOSIS — E7800 Pure hypercholesterolemia, unspecified: Secondary | ICD-10-CM

## 2020-08-08 DIAGNOSIS — F3170 Bipolar disorder, currently in remission, most recent episode unspecified: Secondary | ICD-10-CM

## 2020-08-08 NOTE — Telephone Encounter (Signed)
Requested medications are due for refill today.  yes  Requested medications are on the active medications list.  yes  Last refill. varies  Future visit scheduled.   no  Notes to clinic.

## 2020-08-22 DIAGNOSIS — L259 Unspecified contact dermatitis, unspecified cause: Secondary | ICD-10-CM | POA: Diagnosis not present

## 2020-09-04 ENCOUNTER — Other Ambulatory Visit: Payer: Self-pay

## 2020-09-04 ENCOUNTER — Ambulatory Visit
Admission: RE | Admit: 2020-09-04 | Discharge: 2020-09-04 | Disposition: A | Discharge status: Home / Self Care | Payer: Medicare HMO | Source: Ambulatory Visit | Attending: Internal Medicine | Admitting: Internal Medicine

## 2020-09-04 DIAGNOSIS — Z1231 Encounter for screening mammogram for malignant neoplasm of breast: Secondary | ICD-10-CM | POA: Insufficient documentation

## 2020-09-11 ENCOUNTER — Telehealth: Payer: Self-pay | Admitting: Internal Medicine

## 2020-09-11 DIAGNOSIS — E2839 Other primary ovarian failure: Secondary | ICD-10-CM | POA: Insufficient documentation

## 2020-09-11 NOTE — Telephone Encounter (Signed)
Pt is a former [employer] patient.  She is needing a Bone Density scheduled - last BD was 08/29/20 She is going to call and schedule this - may need new order if [facility] will not schedule without.   She is requesting TOC I advised that we are not [facility] taking new patients or TOC at this time but she wanted me to ask Dr Glori Bickers if she could possibly take her on as a new patient. I advised that if not able to then we will have to look at other [place] sites for a TOC visit.  She lives too far away to go to [place] to see Rollene Fare   Please advise, thanks.

## 2020-09-11 NOTE — Telephone Encounter (Signed)
I am not taking new patients currently  Let her know we have new providers coming however in July and august   I did place the dexa order so she can call and schedule it

## 2020-09-12 ENCOUNTER — Telehealth: Payer: Self-pay | Admitting: Internal Medicine

## 2020-09-12 NOTE — Telephone Encounter (Signed)
Spoke with pt about [facility] not taking new patients at this time. Pt is aware of BD order has been put in. Pt will wait until new providers are here to schedule a new pt appointment.

## 2020-09-12 NOTE — Telephone Encounter (Signed)
Patient would like to have a order for a bone density to be sent to [facility]. Wasn't sure who to send this too. EM

## 2020-09-12 NOTE — Telephone Encounter (Signed)
Bone density order was placed yesterday by Dr. Glori Bickers. Pt just needs to call the facility where she wants it done and schedule it.

## 2020-09-13 NOTE — Telephone Encounter (Addendum)
Called pt letting her know that the bone density was placed and she was aware.Pt called and scheduled an appt for 7/14 at 10:20

## 2020-09-27 ENCOUNTER — Ambulatory Visit
Admission: RE | Admit: 2020-09-27 | Discharge: 2020-09-27 | Disposition: A | Discharge status: Home / Self Care | Payer: Medicare HMO | Source: Ambulatory Visit | Attending: Family Medicine | Admitting: Family Medicine

## 2020-09-27 ENCOUNTER — Other Ambulatory Visit: Payer: Self-pay

## 2020-09-27 DIAGNOSIS — M8588 Other specified disorders of bone density and structure, other site: Secondary | ICD-10-CM | POA: Diagnosis not present

## 2020-09-27 DIAGNOSIS — M85852 Other specified disorders of bone density and structure, left thigh: Secondary | ICD-10-CM | POA: Diagnosis not present

## 2020-09-27 DIAGNOSIS — E2839 Other primary ovarian failure: Secondary | ICD-10-CM | POA: Diagnosis not present

## 2020-09-27 DIAGNOSIS — Z780 Asymptomatic menopausal state: Secondary | ICD-10-CM | POA: Diagnosis not present

## 2020-09-27 DIAGNOSIS — M85832 Other specified disorders of bone density and structure, left forearm: Secondary | ICD-10-CM | POA: Diagnosis not present

## 2020-09-28 ENCOUNTER — Encounter: Payer: Self-pay | Admitting: *Deleted

## 2021-01-07 ENCOUNTER — Other Ambulatory Visit: Payer: Self-pay | Admitting: Family

## 2021-01-07 DIAGNOSIS — E7800 Pure hypercholesterolemia, unspecified: Secondary | ICD-10-CM

## 2021-01-07 DIAGNOSIS — F3170 Bipolar disorder, currently in remission, most recent episode unspecified: Secondary | ICD-10-CM

## 2021-01-08 ENCOUNTER — Telehealth: Payer: Self-pay | Admitting: Nurse Practitioner

## 2021-01-08 DIAGNOSIS — F3170 Bipolar disorder, currently in remission, most recent episode unspecified: Secondary | ICD-10-CM

## 2021-01-08 DIAGNOSIS — E7800 Pure hypercholesterolemia, unspecified: Secondary | ICD-10-CM

## 2021-01-08 MED ORDER — MIRTAZAPINE 15 MG PO TABS: 15.0000 mg | ORAL_TABLET | Freq: Every day | ORAL | 0 refills | Status: DC

## 2021-01-08 MED ORDER — SIMVASTATIN 10 MG PO TABS: 10.0000 mg | ORAL_TABLET | Freq: Every day | ORAL | 0 refills | Status: DC

## 2021-01-08 MED ORDER — LITHIUM CARBONATE ER 300 MG PO TBCR: 300.0000 mg | EXTENDED_RELEASE_TABLET | Freq: Two times a day (BID) | ORAL | 0 refills | Status: DC

## 2021-01-08 NOTE — Telephone Encounter (Signed)
Noted. Thank you. Patient advised and was appreciative. I advised patient she will need to have an appointment before she runs out of her medication next time.

## 2021-01-08 NOTE — Telephone Encounter (Signed)
Spoke with patient. She wants to establish with Tabitha but her schedule is not open yet. Patient was advised that I would send her request to another provider to see if we can get these medications filled until she is seen by [place]. Patient needs to use mail order and requests 90 day supply please. Refill requests went to [facility] earlier this month but were denied stating patient needs an appointment. Not able to schedule at this time. Sending to Dr Darnell Level to review.

## 2021-01-08 NOTE — Telephone Encounter (Signed)
Patient is a former patient of Dr.Baity, she was calling to inquire about a TOC appointment again with a provider here.  Also, patient has also stated she needs a 90 day supply of 3 of her current meds. I stated I will put the message in and have someone get back to her about it.   SIMVASTATIN, MIRTAZAPINE, LITHIUM CARBONATE   Pharmacy: [facility], [place]  Please advise

## 2021-01-08 NOTE — Telephone Encounter (Signed)
Will send in one 3 month supply for patient.

## 2021-01-14 ENCOUNTER — Other Ambulatory Visit: Payer: Self-pay

## 2021-01-14 DIAGNOSIS — F3170 Bipolar disorder, currently in remission, most recent episode unspecified: Secondary | ICD-10-CM

## 2021-01-14 MED ORDER — LITHIUM CARBONATE ER 300 MG PO TBCR
300.0000 mg | EXTENDED_RELEASE_TABLET | Freq: Two times a day (BID) | ORAL | 1 refills | Status: DC
Start: 2021-01-14 — End: 2021-06-26

## 2021-02-06 DIAGNOSIS — L538 Other specified erythematous conditions: Secondary | ICD-10-CM | POA: Diagnosis not present

## 2021-02-06 DIAGNOSIS — D2261 Melanocytic nevi of right upper limb, including shoulder: Secondary | ICD-10-CM | POA: Diagnosis not present

## 2021-02-06 DIAGNOSIS — Z85828 Personal history of other malignant neoplasm of skin: Secondary | ICD-10-CM | POA: Diagnosis not present

## 2021-02-06 DIAGNOSIS — D2262 Melanocytic nevi of left upper limb, including shoulder: Secondary | ICD-10-CM | POA: Diagnosis not present

## 2021-02-06 DIAGNOSIS — R58 Hemorrhage, not elsewhere classified: Secondary | ICD-10-CM | POA: Diagnosis not present

## 2021-02-06 DIAGNOSIS — L820 Inflamed seborrheic keratosis: Secondary | ICD-10-CM | POA: Diagnosis not present

## 2021-02-06 DIAGNOSIS — D225 Melanocytic nevi of trunk: Secondary | ICD-10-CM | POA: Diagnosis not present

## 2021-02-20 DIAGNOSIS — E039 Hypothyroidism, unspecified: Secondary | ICD-10-CM | POA: Diagnosis not present

## 2021-02-27 DIAGNOSIS — J449 Chronic obstructive pulmonary disease, unspecified: Secondary | ICD-10-CM | POA: Diagnosis not present

## 2021-02-27 DIAGNOSIS — E890 Postprocedural hypothyroidism: Secondary | ICD-10-CM | POA: Diagnosis not present

## 2021-03-22 ENCOUNTER — Other Ambulatory Visit: Payer: Self-pay | Admitting: Family Medicine

## 2021-03-22 DIAGNOSIS — E7800 Pure hypercholesterolemia, unspecified: Secondary | ICD-10-CM

## 2021-03-22 DIAGNOSIS — F3170 Bipolar disorder, currently in remission, most recent episode unspecified: Secondary | ICD-10-CM

## 2021-04-11 DIAGNOSIS — Z96641 Presence of right artificial hip joint: Secondary | ICD-10-CM | POA: Diagnosis not present

## 2021-04-25 DIAGNOSIS — B351 Tinea unguium: Secondary | ICD-10-CM | POA: Diagnosis not present

## 2021-04-25 DIAGNOSIS — J449 Chronic obstructive pulmonary disease, unspecified: Secondary | ICD-10-CM | POA: Diagnosis not present

## 2021-04-25 DIAGNOSIS — F319 Bipolar disorder, unspecified: Secondary | ICD-10-CM | POA: Diagnosis not present

## 2021-04-25 DIAGNOSIS — M79675 Pain in left toe(s): Secondary | ICD-10-CM | POA: Diagnosis not present

## 2021-06-26 ENCOUNTER — Encounter: Payer: Self-pay | Admitting: Family

## 2021-06-26 ENCOUNTER — Telehealth: Payer: Self-pay | Admitting: Family

## 2021-06-26 ENCOUNTER — Ambulatory Visit (INDEPENDENT_AMBULATORY_CARE_PROVIDER_SITE_OTHER): Payer: Medicare HMO | Admitting: Family

## 2021-06-26 VITALS — BP 128/70 | HR 62 | Temp 98.4°F | Resp 16 | Ht 66.5 in | Wt 166.2 lb

## 2021-06-26 DIAGNOSIS — E7800 Pure hypercholesterolemia, unspecified: Secondary | ICD-10-CM | POA: Diagnosis not present

## 2021-06-26 DIAGNOSIS — Z5181 Encounter for therapeutic drug level monitoring: Secondary | ICD-10-CM | POA: Insufficient documentation

## 2021-06-26 DIAGNOSIS — F3170 Bipolar disorder, currently in remission, most recent episode unspecified: Secondary | ICD-10-CM | POA: Diagnosis not present

## 2021-06-26 DIAGNOSIS — Z79899 Other long term (current) drug therapy: Secondary | ICD-10-CM | POA: Diagnosis not present

## 2021-06-26 DIAGNOSIS — E890 Postprocedural hypothyroidism: Secondary | ICD-10-CM | POA: Diagnosis not present

## 2021-06-26 DIAGNOSIS — M8589 Other specified disorders of bone density and structure, multiple sites: Secondary | ICD-10-CM

## 2021-06-26 DIAGNOSIS — Z1231 Encounter for screening mammogram for malignant neoplasm of breast: Secondary | ICD-10-CM | POA: Diagnosis not present

## 2021-06-26 DIAGNOSIS — D509 Iron deficiency anemia, unspecified: Secondary | ICD-10-CM | POA: Insufficient documentation

## 2021-06-26 LAB — CBC WITH DIFFERENTIAL/PLATELET
Basophils Absolute: 0.1 10*3/uL (ref 0.0–0.1)
Basophils Relative: 0.9 % (ref 0.0–3.0)
Eosinophils Absolute: 0.1 10*3/uL (ref 0.0–0.7)
Eosinophils Relative: 2 % (ref 0.0–5.0)
HCT: 44.2 % (ref 36.0–46.0)
Hemoglobin: 14.4 g/dL (ref 12.0–15.0)
Lymphocytes Relative: 32.4 % (ref 12.0–46.0)
Lymphs Abs: 2.1 10*3/uL (ref 0.7–4.0)
MCHC: 32.6 g/dL (ref 30.0–36.0)
MCV: 91.7 fl (ref 78.0–100.0)
Monocytes Absolute: 0.5 10*3/uL (ref 0.1–1.0)
Monocytes Relative: 8 % (ref 3.0–12.0)
Neutro Abs: 3.6 10*3/uL (ref 1.4–7.7)
Neutrophils Relative %: 56.7 % (ref 43.0–77.0)
Platelets: 191 10*3/uL (ref 150.0–400.0)
RBC: 4.82 Mil/uL (ref 3.87–5.11)
RDW: 13.7 % (ref 11.5–15.5)
WBC: 6.4 10*3/uL (ref 4.0–10.5)

## 2021-06-26 LAB — COMPREHENSIVE METABOLIC PANEL
ALT: 16 U/L (ref 0–35)
AST: 18 U/L (ref 0–37)
Albumin: 4.6 g/dL (ref 3.5–5.2)
Alkaline Phosphatase: 66 U/L (ref 39–117)
BUN: 21 mg/dL (ref 6–23)
CO2: 31 mEq/L (ref 19–32)
Calcium: 9.5 mg/dL (ref 8.4–10.5)
Chloride: 102 mEq/L (ref 96–112)
Creatinine, Ser: 0.99 mg/dL (ref 0.40–1.20)
GFR: 56.22 mL/min — ABNORMAL LOW (ref >60.00)
Glucose, Bld: 99 mg/dL (ref 70–99)
Potassium: 4.5 mEq/L (ref 3.5–5.1)
Sodium: 140 mEq/L (ref 135–145)
Total Bilirubin: 1.1 mg/dL (ref 0.2–1.2)
Total Protein: 6.9 g/dL (ref 6.0–8.3)

## 2021-06-26 LAB — LIPID PANEL
Cholesterol: 190 mg/dL (ref 0–200)
HDL: 67.9 mg/dL (ref >39.00)
LDL Cholesterol: 95 mg/dL (ref 0–99)
NonHDL: 122.41
Total CHOL/HDL Ratio: 3
Triglycerides: 136 mg/dL (ref 0.0–149.0)
VLDL: 27.2 mg/dL (ref 0.0–40.0)

## 2021-06-26 LAB — TSH: TSH: 0.78 u[IU]/mL (ref 0.35–5.50)

## 2021-06-26 MED ORDER — LEVOTHYROXINE SODIUM 112 MCG PO TABS: 112.0000 ug | ORAL_TABLET | Freq: Every day | ORAL | 1 refills | Status: DC

## 2021-06-26 MED ORDER — MIRTAZAPINE 15 MG PO TABS: 15.0000 mg | ORAL_TABLET | Freq: Every day | ORAL | 1 refills | Status: DC

## 2021-06-26 MED ORDER — SIMVASTATIN 10 MG PO TABS: 10.0000 mg | ORAL_TABLET | Freq: Every day | ORAL | 1 refills | Status: DC

## 2021-06-26 MED ORDER — LITHIUM CARBONATE ER 300 MG PO TBCR: 300.0000 mg | EXTENDED_RELEASE_TABLET | Freq: Two times a day (BID) | ORAL | 1 refills | Status: DC

## 2021-06-26 NOTE — Assessment & Plan Note (Signed)
Order cbc pending results. 

## 2021-06-26 NOTE — Patient Instructions (Addendum)
Stop by the lab prior to leaving today. I will notify you of your results once received.  ? ?Mammogram will not be due until 09/04/2021 ?Placed an order for mammogram,  ? ?Bone density not due until 2024.  ?Colonoscopy not due until 2024.  ? ?Due for tetanus TDAP vaccine, as overdue this is good for two years. On record, last done in 2006.  ? ?A referral was placed today for psychiatry. ?Please let us know if you have not heard back within 2 weeks about the referral. ? ?Due to recent changes in healthcare laws, you may see results of your imaging and/or laboratory studies on MyChart before I have had a chance to review them.  I understand that in some cases there may be results that are confusing or concerning to you. Please understand that not all results are received at the same time and often I may need to interpret multiple results in order to provide you with the best plan of care or course of treatment. Therefore, I ask that you please give me 2 business days to thoroughly review all your results before contacting my office for clarification. Should we see a critical lab result, you will be contacted sooner.  ? ?It was a pleasure seeing you today! Please do not hesitate to reach out with any questions and or concerns. ? ?Regards,  ? ?Amaira Safley ?FNP-C ? ? ? ? ?

## 2021-06-26 NOTE — Telephone Encounter (Signed)
Called and left a message that I have mailed her office notes. ?

## 2021-06-26 NOTE — Assessment & Plan Note (Signed)
tsh ordered pending results ?rx refill sent for levothyroxine 112 mcg  ?

## 2021-06-26 NOTE — Assessment & Plan Note (Signed)
Refill rx simvastatin 10 mg ?Ordered lipid panel, pending results. Work on low cholesterol diet and exercise as tolerated ? ?

## 2021-06-26 NOTE — Assessment & Plan Note (Signed)
Lithium ordered pending results ?

## 2021-06-26 NOTE — Assessment & Plan Note (Signed)
Ordered mammogram, not due until June however  ? ?

## 2021-06-26 NOTE — Assessment & Plan Note (Signed)
Bone density not due until 2024 ?Weight bearing exercises ?reccomend vitamin d and calcium ?

## 2021-06-26 NOTE — Assessment & Plan Note (Signed)
rx lithium refill 300 mg once daily ?Lithium level ordered pending results for therapeutic monitoring ?Referral to psych as should be treated for bipolar under their scope of practice  ?Pt aware ?

## 2021-06-26 NOTE — Progress Notes (Signed)
? ?Established Patient Office Visit ? ?Subjective:  ?Patient ID: Felicia Ross, female    DOB: Jun 15, 1945  Age: 76 y.o. MRN: [id number] ? ?CC:  ?Chief Complaint  ?Patient presents with  ? Transitions Of Care  ? ? ?HPI ?Felicia Ross is here for a transition of care visit. ? ?Prior provider was: Webb Silversmith, FNP  ?Pt is without acute concerns.  ? ?chronic concerns: ? ?HLD: taking simvastatin 10 mg  ? ?Bipolar affective disorder: taking lithium 300 mg, doing well. Has been table for years. Not currently seeing a psychiatrist. Also taking remeron 15 mg.  ?Lab Results  ?Component Value Date  ? LITHIUM 0.5 (L) 06/18/2020  ? NA 140 06/18/2020  ? BUN 15 06/18/2020  ? CREATININE 0.85 06/18/2020  ? TSH 0.74 06/18/2020  ? WBC 6.2 06/18/2020  ? ?Mammogram: last 09/04/20 ?Bone density: last 09/27/2020 osteopenia in lower spine and left hip  ?Colonscopy: polyps were benign. Was 08/09/19, due again in 2024 ? ?Past Medical History:  ?Diagnosis Date  ? Arthritis   ? Bipolar disorder ([place])   ? Depression   ? Hypercholesteremia   ? Hypothyroidism   ? Osteopenia   ? Thyroid goiter   ? ? ?Past Surgical History:  ?Procedure Laterality Date  ? COLONOSCOPY WITH PROPOFOL N/A 08/09/2019  ? Procedure: COLONOSCOPY WITH PROPOFOL;  Surgeon: Jonathon Bellows, MD;  [facility];  Service: Gastroenterology;  Laterality: N/A;  ? THYROID SURGERY  2010  ? TOTAL HIP ARTHROPLASTY Right 03/28/2020  ? Procedure: TOTAL HIP ARTHROPLASTY ANTERIOR APPROACH;  Surgeon: Gaynelle Arabian, MD;  Location: [facility] ORS;  Service: Orthopedics;  Laterality: Right;  15mn  ? TUBAL LIGATION    ? ? ?Family History  ?Problem Relation Age of Onset  ? Ovarian cancer Mother   ? Hypertension Mother   ? Heart attack Father   ? Hypertension Brother   ? Breast cancer Maternal Aunt   ? Lung cancer Paternal Aunt   ? Breast cancer Paternal Aunt   ? Hypertension Other   ? Breast cancer Cousin   ? ? ?Social History  ? ?Socioeconomic History  ? Marital status: Married  ?  Spouse  name: LBobby Rumpf  ? Number of children: 3  ? Years of education: College  ? Highest education level: Not on file  ?Occupational History  ? Occupation: Part-time  ?Tobacco Use  ? Smoking status: Never  ? Smokeless tobacco: Never  ?Vaping Use  ? Vaping Use: Never used  ?Substance and Sexual Activity  ? Alcohol use: No  ?  Alcohol/week: 0.0 standard drinks  ? Drug use: No  ? Sexual activity: Yes  ?  Partners: Male  ?  Birth control/protection: Post-menopausal  ?Other Topics Concern  ? Not on file  ?Social History Narrative  ? Not on file  ? ?Social Determinants of Health  ? ?Financial Resource Strain: Not on file  ?Food Insecurity: Not on file  ?Transportation Needs: Not on file  ?Physical Activity: Not on file  ?Stress: Not on file  ?Social Connections: Not on file  ?Intimate Partner Violence: Not on file  ? ? ?Outpatient Medications Prior to Visit  ?Medication Sig Dispense Refill  ? Cholecalciferol (VITAMIN D) 2000 units CAPS Take 1 capsule by mouth daily.    ? Multiple Vitamin (MULTIVITAMIN) tablet Take 1 tablet by mouth daily.    ? Omega-3 Fatty Acids (FISH OIL) 1200 MG CAPS Take 1,200 mg by mouth 2 (two) times daily.     ? levothyroxine (SYNTHROID) 112  MCG tablet TAKE 1 TABLET EVERY DAY (Patient taking differently: Take 112 mcg by mouth daily before breakfast.) 90 tablet 2  ? lithium carbonate (LITHOBID) 300 MG CR tablet Take 1 tablet (300 mg total) by mouth 2 (two) times daily. 180 tablet 1  ? mirtazapine (REMERON) 15 MG tablet TAKE 1 TABLET AT BEDTIME 90 tablet 0  ? simvastatin (ZOCOR) 10 MG tablet TAKE 1 TABLET AT BEDTIME 90 tablet 0  ? ?No facility-administered medications prior to visit.  ? ? ?Allergies  ?Allergen Reactions  ? Fluzone [Influenza Virus Vaccine] Hives  ?  High dose only---hives, itchy  ? ? ?ROS ?Review of Systems ? ?Review of Systems  ?Respiratory:  Negative for shortness of breath.   ?Cardiovascular:  Negative for chest pain and palpitations.  ?Gastrointestinal:  Negative for constipation and  diarrhea.  ?Genitourinary:  Negative for dysuria, frequency and urgency.  ?Musculoskeletal:  Negative for myalgias.  ?Psychiatric/Behavioral:  Negative for depression and suicidal ideas.   ?All other systems reviewed and are negative. ? ?  ?Objective:  ?  ?Physical Exam ? ?Gen: NAD, resting comfortably ?CV: RRR with no murmurs appreciated ?Pulm: NWOB, CTAB with no crackles, wheezes, or rhonchi ?Skin: warm, dry ?Psych: Normal affect and thought content ? ?BP 128/70   Pulse 62   Temp 98.4 ?F (36.9 ?C)   Resp 16   Ht 5' 6.5" (1.689 m)   Wt 166 lb 4 oz (75.4 kg)   SpO2 94%   BMI 26.43 kg/m?  ?Wt Readings from Last 3 Encounters:  ?06/26/21 166 lb 4 oz (75.4 kg)  ?06/18/20 167 lb (75.8 kg)  ?03/28/20 167 lb (75.8 kg)  ? ? ? ?Health Maintenance Due  ?Topic Date Due  ? TETANUS/TDAP  01/21/2015  ? ? ?There are no preventive care reminders to display for this patient. ? ?Lab Results  ?Component Value Date  ? TSH 0.74 06/18/2020  ? ?Lab Results  ?Component Value Date  ? WBC 6.2 06/18/2020  ? HGB 14.1 06/18/2020  ? HCT 42.9 06/18/2020  ? MCV 89.4 06/18/2020  ? PLT 188.0 06/18/2020  ? ?Lab Results  ?Component Value Date  ? NA 140 06/18/2020  ? K 4.4 06/18/2020  ? CO2 33 (H) 06/18/2020  ? GLUCOSE 98 06/18/2020  ? BUN 15 06/18/2020  ? CREATININE 0.85 06/18/2020  ? BILITOT 0.7 06/18/2020  ? ALKPHOS 86 06/18/2020  ? AST 17 06/18/2020  ? ALT 16 06/18/2020  ? PROT 7.0 06/18/2020  ? ALBUMIN 4.3 06/18/2020  ? CALCIUM 9.7 06/18/2020  ? ANIONGAP 9 03/29/2020  ? GFR 67.99 06/18/2020  ? ?Lab Results  ?Component Value Date  ? CHOL 182 06/18/2020  ? ?Lab Results  ?Component Value Date  ? HDL 58.90 06/18/2020  ? ?Lab Results  ?Component Value Date  ? Ward 90 06/18/2020  ? ?Lab Results  ?Component Value Date  ? TRIG 165.0 (H) 06/18/2020  ? ?Lab Results  ?Component Value Date  ? CHOLHDL 3 06/18/2020  ? ?Lab Results  ?Component Value Date  ? HGBA1C 5.6 03/06/2020  ? ? ?  ?Assessment & Plan:  ? ?Problem List Items Addressed This Visit    ? ?  ? Endocrine  ? Hypothyroidism  ?  tsh ordered pending results ?rx refill sent for levothyroxine 112 mcg  ?  ?  ? Relevant Medications  ? levothyroxine (SYNTHROID) 112 MCG tablet  ? Other Relevant Orders  ? TSH  ?  ? Musculoskeletal and Integument  ? Osteopenia  ?  Bone density  not due until 2024 ?Weight bearing exercises ?reccomend vitamin d and calcium ?  ?  ?  ? Other  ? Affective bipolar disorder ([place])  ?  rx lithium refill 300 mg once daily ?Lithium level ordered pending results for therapeutic monitoring ?Referral to psych as should be treated for bipolar under their scope of practice  ?Pt aware ?  ?  ? Relevant Medications  ? lithium carbonate (LITHOBID) 300 MG CR tablet  ? mirtazapine (REMERON) 15 MG tablet  ? Other Relevant Orders  ? Ambulatory referral to Psychiatry  ? Hypercholesteremia  ?  Refill rx simvastatin 10 mg ?Ordered lipid panel, pending results. Work on low cholesterol diet and exercise as tolerated ? ?  ?  ? Relevant Medications  ? simvastatin (ZOCOR) 10 MG tablet  ? Screening mammogram for breast cancer  ?  Ordered mammogram, not due until June however  ? ?  ?  ? Relevant Orders  ? MM 3D SCREEN BREAST BILATERAL  ? Encounter for lithium monitoring - Primary  ?  Lithium ordered pending results ?  ?  ? Relevant Orders  ? Lithium level  ? Iron deficiency anemia  ?  Order cbc pending results ?  ?  ? Relevant Orders  ? CBC with Differential/Platelet  ? RESOLVED: Pure hypercholesterolemia  ? Relevant Medications  ? simvastatin (ZOCOR) 10 MG tablet  ? Other Relevant Orders  ? Comprehensive metabolic panel  ? Lipid panel  ? ? ?Meds ordered this encounter  ?Medications  ? lithium carbonate (LITHOBID) 300 MG CR tablet  ?  Sig: Take 1 tablet (300 mg total) by mouth 2 (two) times daily.  ?  Dispense:  90 tablet  ?  Refill:  1  ?  Order Specific Question:   Supervising Provider  ?  Answer:   BEDSOLE, AMY E [[id number]]  ? DISCONTD: levothyroxine (SYNTHROID) 112 MCG tablet  ?  Sig: Take 1 tablet (112 mcg  total) by mouth daily.  ?  Dispense:  90 tablet  ?  Refill:  1  ?  Order Specific Question:   Supervising Provider  ?  Answer:   BEDSOLE, AMY E [[id number]]  ? mirtazapine (REMERON) 15 MG tablet  ?  Sig: Take 1 tablet (15 mg

## 2021-06-26 NOTE — Telephone Encounter (Signed)
Pt called stating she was in today and stated that Tabitha was going to give her notes of the OV. Pt is asking if she would mail them to her. Please advise. ?

## 2021-06-27 LAB — LITHIUM LEVEL: Lithium Lvl: 0.4 mmol/L — ABNORMAL LOW (ref 0.6–1.2)

## 2021-06-28 ENCOUNTER — Telehealth: Payer: Self-pay

## 2021-06-28 NOTE — Progress Notes (Signed)
Left message to return call to our office.  

## 2021-06-28 NOTE — Telephone Encounter (Signed)
-----   Message from Eugenia Pancoast, [place] sent at 06/28/2021  8:23 AM EDT ----- ?Lithium is lower end however pt is stable and feeling well so we will keep lithium dose at once daily.  ?Please advise pt since she has been stable on lithium I am ok with continuing to manage and fill as needed, however if she begins to change how she is feeling or has any mania type symptoms she will need to see psychiatry. ? ?Kidney function ok. Liver function good.  ?Cholesterol is great. ?No anemia. ?Thyroid is stable. ?

## 2021-06-28 NOTE — Progress Notes (Signed)
Lithium is lower end however pt is stable and feeling well so we will keep lithium dose at once daily.  ?Please advise pt since she has been stable on lithium I am ok with continuing to manage and fill as needed, however if she begins to change how she is feeling or has any mania type symptoms she will need to see psychiatry. ? ?Kidney function ok. Liver function good.  ?Cholesterol is great. ?No anemia. ?Thyroid is stable.

## 2021-06-28 NOTE — Telephone Encounter (Signed)
Left message to return call to our office.  

## 2021-07-01 NOTE — Telephone Encounter (Signed)
Pt returning your call

## 2021-07-02 NOTE — Telephone Encounter (Signed)
Spoke to pt- informed of results

## 2021-07-05 ENCOUNTER — Telehealth: Payer: Self-pay | Admitting: Family

## 2021-07-05 NOTE — Telephone Encounter (Signed)
Pt called stating she wanted to give Dr. Phebe Colla her annual physical code and to call [facility] and make sure it is the right code.  ? ?Code: Y5183 or G0405 ?

## 2021-07-08 NOTE — Addendum Note (Signed)
Addended by: Eugenia Pancoast on: 07/08/2021 01:25 PM ? ? Modules accepted: Level of Service ? ?

## 2021-07-10 ENCOUNTER — Telehealth: Payer: Self-pay | Admitting: Family

## 2021-07-10 NOTE — Telephone Encounter (Signed)
Pt called back and wants to speak with the nurse about the code for her appointment with Tabitha.  ? ?Callback Number: [phone] ?

## 2021-07-18 ENCOUNTER — Ambulatory Visit (INDEPENDENT_AMBULATORY_CARE_PROVIDER_SITE_OTHER): Payer: Medicare HMO | Admitting: *Deleted

## 2021-07-18 DIAGNOSIS — Z0000 Encounter for general adult medical examination without abnormal findings: Secondary | ICD-10-CM

## 2021-07-18 NOTE — Progress Notes (Addendum)
Subjective:   Felicia Ross is a 76 y.o. female who presents for Medicare Annual (Subsequent) preventive examination.  I connected with  Felicia Ross on 07/23/21 by a telephone not in person enabled telemedicine application and verified that I am speaking with the correct person using two identifiers.   I discussed the limitations of evaluation and management by telemedicine. The patient expressed understanding and agreed to proceed.  Patient location: home  Provider location: Tele-Health -home    Review of Systems     Cardiac Risk Factors include: advanced age (>62mn, >>68women)     Objective:    Today's Vitals   There is no height or weight on file to calculate BMI.     07/18/2021   12:35 PM 03/28/2020   12:50 PM 03/20/2020    8:49 AM 08/09/2019    7:30 AM 05/02/2015   10:09 AM  Advanced Directives  Does Patient Have a Medical Advance Directive? Yes Yes Yes Yes Yes  Type of [place]of [place] will [place] will  [place] will  Does patient want to make changes to medical advance directive?  No - Guardian declined     Copy of [place] Chart? No - copy requested No - copy requested       Current Medications (verified) Outpatient Encounter Medications as of 07/18/2021  Medication Sig   Cholecalciferol (VITAMIN D) 2000 units CAPS Take 1 capsule by mouth daily.   levothyroxine (SYNTHROID) 112 MCG tablet Take 1 tablet (112 mcg total) by mouth daily.   lithium carbonate (LITHOBID) 300 MG CR tablet Take 1 tablet (300 mg total) by mouth 2 (two) times daily.   mirtazapine (REMERON) 15 MG tablet Take 1 tablet (15 mg total) by mouth at bedtime.   Multiple Vitamin (MULTIVITAMIN) tablet Take 1 tablet by mouth daily.   Omega-3 Fatty Acids (FISH OIL) 1200 MG CAPS Take 1,200 mg by mouth 2 (two) times daily.    simvastatin (ZOCOR) 10 MG tablet  Take 1 tablet (10 mg total) by mouth at bedtime.   No facility-administered encounter medications on file as of 07/18/2021.    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   Procedure: TOTAL HIP ARTHROPLASTY ANTERIOR APPROACH;  Surgeon: AGaynelle Arabian MD;  Location: [facility];  Service: Orthopedics;  Laterality: Right;  1065m   TUBAL LIGATION     Family History  Problem Relation Age of Onset   Ovarian cancer Mother    Hypertension Mother    Heart attack Father    Hypertension Brother    Breast cancer Maternal Aunt    Lung cancer Paternal Aunt    Breast cancer Paternal Aunt    Hypertension Other    Breast cancer Cousin    Social History   Socioeconomic History   Marital status: Married    Spouse name: Felicia Ross    Number of children: 3  Years of education: College   Highest education level: Not on file  Occupational History   Occupation: Part-time  Tobacco Use   Smoking status: Never   Smokeless tobacco: Never  Vaping Use   Vaping Use: Never used  Substance and Sexual Activity   Alcohol use: No    Alcohol/week: 0.0 standard drinks   Drug use: No   Sexual activity: Yes    Partners: Male    Birth control/protection: Post-menopausal  Other Topics Concern   Not on file  Social History Narrative   Not on file   Social Determinants of Health   Financial Resource Strain: Low Risk    Difficulty of Paying Living Expenses: Not hard at all  Food Insecurity: No Food Insecurity   Worried  About Charity fundraiser in the Last Year: Never true   [place] in the Last Year: Never true  Transportation Needs: No Transportation Needs   Lack of Transportation (Medical): No   Lack of Transportation (Non-Medical): No  Physical Activity: Insufficiently Active   Days of Exercise per Week: 3 days   Minutes of Exercise per Session: 20 min  Stress: No Stress Concern Present   Feeling of Stress : Not at all  Social Connections: Socially Integrated   Frequency of Communication with Friends and Family: More than three times a week   Frequency of Social Gatherings with Friends and Family: Three times a week   Attends Religious Services: More than 4 times per year   Active Member of Clubs or Organizations: Yes   Attends Music therapist: More than 4 times per year   Marital Status: Married    Tobacco Counseling Counseling given: Not Answered   Clinical Intake:  Pre-visit preparation completed: Yes  Pain : No/denies pain     Nutritional Risks: None Diabetes: No  How often do you need to have someone help you when you read instructions, pamphlets, or other written materials from your doctor or pharmacy?: 1 - Never  Diabetic?  no  Interpreter Needed?: No  Information entered by :: Leroy Kennedy LPN   Activities of Daily Living    07/18/2021   12:39 PM  In your present state of health, do you have any difficulty performing the following activities:  Hearing? 0  Vision? 0  Difficulty concentrating or making decisions? 0  Walking or climbing stairs? 0  Dressing or bathing? 0  Doing errands, shopping? 0  Preparing Food and eating ? N  Using the Toilet? N  In the past six months, have you accidently leaked urine? N  Do you have problems with loss of bowel control? N  Managing your Medications? N  Managing your Finances? N  Housekeeping or managing your Housekeeping? N    Patient Care Team: Eugenia Pancoast, FNP as PCP - General (Family  Medicine)  Indicate any recent Medical Services you may have received from other than [facility] providers in the past year (date may be approximate).     Assessment:   This is a routine wellness examination for Felicia Ross.  Hearing/Vision screen Hearing Screening - Comments:: No trouble hearing Vision Screening - Comments:: Up to date [place]  [place] [facility]  Dietary issues and exercise activities discussed: Current Exercise Habits: Home exercise routine, Type of exercise: walking, Time (Minutes): 30, Frequency (Times/Week): 4, Weekly Exercise (Minutes/Week): 120, Intensity: Mild, Exercise limited by: None identified   Goals Addressed             This Visit's Progress  Patient Stated       No goals        Depression Screen    07/18/2021   12:42 PM 06/18/2020    8:45 AM 03/06/2020    8:50 AM 06/16/2019    8:30 AM 05/11/2018    9:14 AM 05/07/2017    9:25 AM 05/07/2017    9:08 AM  PHQ 2/9 Scores  PHQ - 2 Score 0 0 0 0 0 0 0  PHQ- 9 Score  1   0      Fall Risk    07/18/2021   12:38 PM 06/26/2021    9:04 AM 06/18/2020    8:45 AM 03/06/2020    8:50 AM 06/16/2019    8:31 AM  Fall Risk   Falls in the past year? 0 0 0 0 0  Number falls in past yr: 0 0  0 0  Injury with Fall? 0 0  0 0  Risk for fall due to :  No Fall Risks     Follow up Falls evaluation completed;Education provided;Falls prevention discussed   Falls evaluation completed     FALL RISK PREVENTION PERTAINING TO THE HOME:  Any stairs in or around the home? No  If so, are there any without handrails? No  Home free of loose throw rugs in walkways, pet beds, electrical cords, etc? Yes  Adequate lighting in your home to reduce risk of falls? Yes   ASSISTIVE DEVICES UTILIZED TO PREVENT FALLS:  Life alert? No  Use of a cane, walker or w/c? No  Grab bars in the bathroom? Yes  Shower chair or bench in shower? Yes  Elevated toilet seat or a handicapped toilet? Yes   TIMED UP AND GO:  Was the test performed? No .     Cognitive Function:        07/18/2021   12:37 PM  6CIT Screen  What Year? 0 points  What month? 0 points  What time? 0 points  Count back from 20 0 points  Months in reverse 0 points  Repeat phrase 2 points  Total Score 2 points    Immunizations Immunization History  Administered Date(s) Administered   Influenza Inj Mdck Quad Pf 12/25/2017, 12/14/2018   Influenza Split 01/16/2015   Influenza, High Dose Seasonal PF 12/19/2015   Influenza,inj,Quad PF,6+ Mos 12/28/2016, 12/25/2017, 12/05/2019   Influenza-Unspecified 12/22/2013   Moderna Sars-Covid-2 Vaccination 05/20/2019, 06/17/2019, 01/31/2020   Pneumococcal Conjugate-13 05/30/2013   Pneumococcal Polysaccharide-23 04/21/2012   Pneumococcal-Unspecified 01/05/2013   Tdap 01/20/2005, 07/15/2021   Zoster Recombinat (Shingrix) 05/19/2018, 12/01/2018   Zoster, Live 04/02/2007    TDAP status: Up to date  Flu Vaccine status: Up to date  Pneumococcal vaccine status: Up to date  Covid-19 vaccine status: Information provided on how to obtain vaccines.   Qualifies for Shingles Vaccine? No   Zostavax completed Yes   Shingrix Completed?: Yes  Screening Tests Health Maintenance  Topic Date Due   COVID-19 Vaccine (4 - Booster for Moderna series) 08/03/2021 (Originally 03/27/2020)   INFLUENZA VACCINE  10/15/2021   COLONOSCOPY (Pts 45-44yr Insurance coverage will need to be confirmed)  08/09/2022   MAMMOGRAM  09/05/2022   TETANUS/TDAP  07/16/2031   Pneumonia Vaccine 75 Years old  Completed   DEXA SCAN  Completed   Hepatitis C Screening  Completed   Zoster Vaccines- Shingrix  Completed   HPV VACCINES  Aged Out    Health Maintenance  There are no preventive care reminders to display for this  patient.   Colorectal cancer screening: Type of screening: Colonoscopy. Completed 2021. Repeat every 3 years  Mammogram status: Completed  . Repeat every year  Bone Density status: Completed 2022. Results reflect: Bone density  results: OSTEOPOROSIS. Repeat every 2 years.  Lung Cancer Screening: (Low Dose CT Chest recommended if Age 22-80 years, 30 pack-year currently smoking OR have quit w/in 15years.) does not qualify.   Lung Cancer Screening Referral:   Additional Screening:  Hepatitis C Screening: does qualify;  Vision Screening: Recommended annual ophthalmology exams for early detection of glaucoma and other disorders of the eye. Is the patient up to date with their annual eye exam?  Yes  Who is the provider or what is the name of the office in which the patient attends annual eye exams? [place] If pt is not established with a provider, would they like to be referred to a provider to establish care? No .   Dental Screening: Recommended annual dental exams for proper oral hygiene  Community Resource Referral / Chronic Care Management: CRR required this visit?  No   CCM required this visit?  No      Plan:     I have personally reviewed and noted the following in the patient's chart:   Medical and social history Use of alcohol, tobacco or illicit drugs  Current medications and supplements including opioid prescriptions.  Functional ability and status Nutritional status Physical activity Advanced directives List of other physicians Hospitalizations, surgeries, and ER visits in previous 12 months Vitals Screenings to include cognitive, depression, and falls Referrals and appointments  In addition, I have reviewed and discussed with patient certain preventive protocols, quality metrics, and best practice recommendations. A written personalized care plan for preventive services as well as general preventive health recommendations were provided to patient.     Leroy Kennedy, LPN   [id number]/[id number]   Nurse Notes:

## 2021-07-19 ENCOUNTER — Telehealth: Payer: Self-pay | Admitting: Family

## 2021-07-19 NOTE — Telephone Encounter (Signed)
Pt called and said she received ov notes from 06/26/21 but asked if we could please mail her labs as well.  ?

## 2021-07-19 NOTE — Telephone Encounter (Signed)
Called and spoke to pt. Will be mailing out today. ?

## 2021-07-23 NOTE — Patient Instructions (Signed)
Felicia Ross , ?Thank you for taking time to come for your Medicare Wellness Visit. I appreciate your ongoing commitment to your health goals. Please review the following plan we discussed and let me know if I can assist you in the future.  ? ?Screening recommendations/referrals: ?Colonoscopy    ?Mammogram: up to date ?Bone Density: up to date ?Recommended yearly ophthalmology/optometry visit for glaucoma screening and checkup ?Recommended yearly dental visit for hygiene and checkup ? ?Vaccinations: ?Influenza vaccine: Education provided ?Pneumococcal vaccine: up to date ?Tdap vaccine: up to date ?Shingles vaccine: up to date    ? ?Advanced directives:  ? ?Conditions/risks identified:  ? ? ?Preventive Care 4 Years and Older, Female ?Preventive care refers to lifestyle choices and visits with your health care provider that can promote health and wellness. ?What does preventive care include? ?A yearly physical exam. This is also called an annual well check. ?Dental exams once or twice a year. ?Routine eye exams. Ask your health care provider how often you should have your eyes checked. ?Personal lifestyle choices, including: ?Daily care of your teeth and gums. ?Regular physical activity. ?Eating a healthy diet. ?Avoiding tobacco and drug use. ?Limiting alcohol use. ?Practicing safe sex. ?Taking low-dose aspirin every day. ?Taking vitamin and mineral supplements as recommended by your health care provider. ?What happens during an annual well check? ?The services and screenings done by your health care provider during your annual well check will depend on your age, overall health, lifestyle risk factors, and family history of disease. ?Counseling  ?Your health care provider may ask you questions about your: ?Alcohol use. ?Tobacco use. ?Drug use. ?Emotional well-being. ?Home and relationship well-being. ?Sexual activity. ?Eating habits. ?History of falls. ?Memory and ability to understand (cognition). ?Work and work  Statistician. ?Reproductive health. ?Screening  ?You may have the following tests or measurements: ?Height, weight, and BMI. ?Blood pressure. ?Lipid and cholesterol levels. These may be checked every 5 years, or more frequently if you are over 67 years old. ?Skin check. ?Lung cancer screening. You may have this screening every year starting at age 29 if you have a 30-pack-year history of smoking and currently smoke or have quit within the past 15 years. ?Fecal occult blood test (FOBT) of the stool. You may have this test every year starting at age 59. ?Flexible sigmoidoscopy or colonoscopy. You may have a sigmoidoscopy every 5 years or a colonoscopy every 10 years starting at age 68. ?Hepatitis C blood test. ?Hepatitis B blood test. ?Sexually transmitted disease (STD) testing. ?Diabetes screening. This is done by checking your blood sugar (glucose) after you have not eaten for a while (fasting). You may have this done every 1-3 years. ?Bone density scan. This is done to screen for osteoporosis. You may have this done starting at age 81. ?Mammogram. This may be done every 1-2 years. Talk to your health care provider about how often you should have regular mammograms. ?Talk with your health care provider about your test results, treatment options, and if necessary, the need for more tests. ?Vaccines  ?Your health care provider may recommend certain vaccines, such as: ?Influenza vaccine. This is recommended every year. ?Tetanus, diphtheria, and acellular pertussis (Tdap, Td) vaccine. You may need a Td booster every 10 years. ?Zoster vaccine. You may need this after age 33. ?Pneumococcal 13-valent conjugate (PCV13) vaccine. One dose is recommended after age 76. ?Pneumococcal polysaccharide (PPSV23) vaccine. One dose is recommended after age 83. ?Talk to your health care provider about which screenings and vaccines you need  and how often you need them. ?This information is not intended to replace advice given to you by  your health care provider. Make sure you discuss any questions you have with your health care provider. ?Document Released: 03/30/2015 Document Revised: 11/21/2015 Document Reviewed: 01/02/2015 ?Elsevier Interactive Patient Education ? 2017 [place]. ? ?Fall Prevention in the Home ?Falls can cause injuries. They can happen to people of all ages. There are many things you can do to make your home safe and to help prevent falls. ?What can I do on the outside of my home? ?Regularly fix the edges of walkways and driveways and fix any cracks. ?Remove anything that might make you trip as you walk through a door, such as a raised step or threshold. ?Trim any bushes or trees on the path to your home. ?Use bright outdoor lighting. ?Clear any walking paths of anything that might make someone trip, such as rocks or tools. ?Regularly check to see if handrails are loose or broken. Make sure that both sides of any steps have handrails. ?Any raised decks and porches should have guardrails on the edges. ?Have any leaves, snow, or ice cleared regularly. ?Use sand or salt on walking paths during winter. ?Clean up any spills in your garage right away. This includes oil or grease spills. ?What can I do in the bathroom? ?Use night lights. ?Install grab bars by the toilet and in the tub and shower. Do not use towel bars as grab bars. ?Use non-skid mats or decals in the tub or shower. ?If you need to sit down in the shower, use a plastic, non-slip stool. ?Keep the floor dry. Clean up any water that spills on the floor as soon as it happens. ?Remove soap buildup in the tub or shower regularly. ?Attach bath mats securely with double-sided non-slip rug tape. ?Do not have throw rugs and other things on the floor that can make you trip. ?What can I do in the bedroom? ?Use night lights. ?Make sure that you have a light by your bed that is easy to reach. ?Do not use any sheets or blankets that are too big for your bed. They should not hang  down onto the floor. ?Have a firm chair that has side arms. You can use this for support while you get dressed. ?Do not have throw rugs and other things on the floor that can make you trip. ?What can I do in the kitchen? ?Clean up any spills right away. ?Avoid walking on wet floors. ?Keep items that you use a lot in easy-to-reach places. ?If you need to reach something above you, use a strong step stool that has a grab bar. ?Keep electrical cords out of the way. ?Do not use floor polish or wax that makes floors slippery. If you must use wax, use non-skid floor wax. ?Do not have throw rugs and other things on the floor that can make you trip. ?What can I do with my stairs? ?Do not leave any items on the stairs. ?Make sure that there are handrails on both sides of the stairs and use them. Fix handrails that are broken or loose. Make sure that handrails are as long as the stairways. ?Check any carpeting to make sure that it is firmly attached to the stairs. Fix any carpet that is loose or worn. ?Avoid having throw rugs at the top or bottom of the stairs. If you do have throw rugs, attach them to the floor with carpet tape. ?Make sure that you  have a light switch at the top of the stairs and the bottom of the stairs. If you do not have them, ask someone to add them for you. ?What else can I do to help prevent falls? ?Wear shoes that: ?Do not have high heels. ?Have rubber bottoms. ?Are comfortable and fit you well. ?Are closed at the toe. Do not wear sandals. ?If you use a stepladder: ?Make sure that it is fully opened. Do not climb a closed stepladder. ?Make sure that both sides of the stepladder are locked into place. ?Ask someone to hold it for you, if possible. ?Clearly mark and make sure that you can see: ?Any grab bars or handrails. ?First and last steps. ?Where the edge of each step is. ?Use tools that help you move around (mobility aids) if they are needed. These  include: ?Canes. ?Walkers. ?Scooters. ?Crutches. ?Turn on the lights when you go into a dark area. Replace any light bulbs as soon as they burn out. ?Set up your furniture so you have a clear path. Avoid moving your furniture around. ?If any of your floors

## 2021-08-07 ENCOUNTER — Telehealth: Payer: Self-pay | Admitting: Family

## 2021-08-07 NOTE — Telephone Encounter (Signed)
Pt called and said that she talked to [facility] and they said they have not received anything about her doing her AWV. Please advise.

## 2021-09-09 ENCOUNTER — Ambulatory Visit
Admission: RE | Admit: 2021-09-09 | Discharge: 2021-09-09 | Disposition: A | Discharge status: Home / Self Care | Payer: Medicare HMO | Source: Ambulatory Visit | Attending: Family | Admitting: Family

## 2021-09-09 DIAGNOSIS — Z1231 Encounter for screening mammogram for malignant neoplasm of breast: Secondary | ICD-10-CM | POA: Diagnosis not present

## 2021-10-20 ENCOUNTER — Other Ambulatory Visit: Payer: Self-pay | Admitting: Family

## 2021-10-20 DIAGNOSIS — F3170 Bipolar disorder, currently in remission, most recent episode unspecified: Secondary | ICD-10-CM

## 2021-11-14 ENCOUNTER — Encounter: Payer: Self-pay | Admitting: Family

## 2021-11-14 ENCOUNTER — Ambulatory Visit (INDEPENDENT_AMBULATORY_CARE_PROVIDER_SITE_OTHER): Payer: Medicare HMO | Admitting: Family

## 2021-11-14 VITALS — BP 110/58 | HR 68 | Temp 98.3°F | Resp 16 | Ht 66.5 in | Wt 162.2 lb

## 2021-11-14 DIAGNOSIS — J302 Other seasonal allergic rhinitis: Secondary | ICD-10-CM | POA: Insufficient documentation

## 2021-11-14 DIAGNOSIS — R12 Heartburn: Secondary | ICD-10-CM | POA: Insufficient documentation

## 2021-11-14 DIAGNOSIS — L2489 Irritant contact dermatitis due to other agents: Secondary | ICD-10-CM | POA: Diagnosis not present

## 2021-11-14 MED ORDER — FLUTICASONE PROPIONATE 50 MCG/ACT NA SUSP: 2.0000 | Freq: Every day | NASAL | 6 refills | Status: DC

## 2021-11-14 MED ORDER — OMEPRAZOLE 20 MG PO CPDR: 20.0000 mg | DELAYED_RELEASE_CAPSULE | Freq: Every day | ORAL | 0 refills | Status: DC

## 2021-11-14 MED ORDER — PREDNISONE 20 MG PO TABS: ORAL_TABLET | ORAL | 0 refills | Status: DC

## 2021-11-14 NOTE — Progress Notes (Signed)
Established Patient Office Visit  Subjective:  Patient ID: Felicia Ross, female    DOB: 10-08-1945  Age: 76 y.o. MRN: [id number]  CC:  Chief Complaint  Patient presents with   Sore Throat    X many years   Cough    X many years    HPI Felicia Ross is here today for follow up.   Pt is with acute concerns, however states symptoms have been for years.  She will feel like 'a spot in her throat' feels like bubble of air that irritates a spot in her throat, and causes her to cough. Cough is non productive, is more dry in nature.   No pain on swallowing, just scratchy. Does have increased hoarseness.  No sob, no doe, and no wheezing.   No ear complaints. Does have rhinorrhea, clear.  Does frequently have pnd.   When eating more solid foods no problems with swallowing, no problems with swallowing thin liquids either.   She does burp often. Hard piece of candy gives her relief.  With lying down at night will increase coughing spells.  In the am no rea change in her coughing spells.  No change in bowel movements, no constipation or diarrhea.   Did have multiple nodules in the thyroid, but removed in 2011.   Also with a rash on right hand between third and fourth finger. Blistering in nature. Itching often. Started Sunday.    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   Procedure: TOTAL HIP ARTHROPLASTY ANTERIOR APPROACH;  Surgeon: Gaynelle Arabian, MD;  Location: [facility] ORS;  Service: Orthopedics;  Laterality: Right;  14mn   TUBAL LIGATION      Family  History  Problem Relation Age of Onset   Ovarian cancer Mother    Hypertension Mother    Heart attack Father    Hypertension Brother    Breast cancer Maternal Aunt    Lung cancer Paternal Aunt    Breast cancer Paternal Aunt    Hypertension Other    Breast cancer Cousin     Social History   Socioeconomic History   Marital status: Married    Spouse name: Lewis    Number of children: 3   Years of education: College   Highest education level: Not on file  Occupational History   Occupation: Part-time  Tobacco Use   Smoking status: Never   Smokeless tobacco: Never  Vaping Use   Vaping Use: Never used  Substance and Sexual Activity   Alcohol use: No    Alcohol/week: 0.0 standard drinks of alcohol   Drug use: No   Sexual activity: Yes    Partners: Male    Birth control/protection: Post-menopausal  Other Topics Concern   Not on file  Social History Narrative   Not on file   Social Determinants of Health   Financial Resource Strain: Low Risk  (07/18/2021)   Overall Financial Resource Strain (CARDIA)    Difficulty of Paying Living Expenses: Not hard at all  Food Insecurity: No  Food Insecurity (07/18/2021)   Hunger Vital Sign    Worried About Running Out of Food in the Last Year: Never true    Ran Out of Food in the Last Year: Never true  Transportation Needs: No Transportation Needs (07/18/2021)   PRAPARE - Hydrologist (Medical): No    Lack of Transportation (Non-Medical): No  Physical Activity: Insufficiently Active (07/18/2021)   Exercise Vital Sign    Days of Exercise per Week: 3 days    Minutes of Exercise per Session: 20 min  Stress: No Stress Concern Present (07/18/2021)   [place]    Feeling of Stress : Not at all  Social Connections: [place] (07/18/2021)   Social Connection and Isolation Panel [NHANES]    Frequency of Communication with Friends and Family: More  than three times a week    Frequency of Social Gatherings with Friends and Family: Three times a week    Attends Religious Services: More than 4 times per year    Active Member of Clubs or Organizations: Yes    Attends Archivist Meetings: More than 4 times per year    Marital Status: Married  Human resources officer Violence: Not At Risk (07/18/2021)   Humiliation, Afraid, Rape, and Kick questionnaire    Fear of Current or Ex-Partner: No    Emotionally Abused: No    Physically Abused: No    Sexually Abused: No    Outpatient Medications Prior to Visit  Medication Sig Dispense Refill   Cholecalciferol (VITAMIN D) 2000 units CAPS Take 1 capsule by mouth daily.     levothyroxine (SYNTHROID) 112 MCG tablet Take 1 tablet (112 mcg total) by mouth daily. 90 tablet 1   lithium carbonate (LITHOBID) 300 MG CR tablet TAKE 1 TABLET TWICE DAILY 180 tablet 1   mirtazapine (REMERON) 15 MG tablet Take 1 tablet (15 mg total) by mouth at bedtime. 90 tablet 1   Multiple Vitamin (MULTIVITAMIN) tablet Take 1 tablet by mouth daily.     Omega-3 Fatty Acids (FISH OIL) 1200 MG CAPS Take 1,200 mg by mouth 2 (two) times daily.      simvastatin (ZOCOR) 10 MG tablet Take 1 tablet (10 mg total) by mouth at bedtime. 90 tablet 1   No facility-administered medications prior to visit.    Allergies  Allergen Reactions   Fluzone [Influenza Virus Vaccine] Hives    High dose only---hives, itchy      Objective:    Physical Exam Constitutional:      General: She is not in acute distress.    Appearance: Normal appearance. She is normal weight. She is not ill-appearing, toxic-appearing or diaphoretic.  HENT:     Right Ear: Tympanic membrane normal.     Left Ear: Tympanic membrane normal.     Nose: Nose normal. No nasal tenderness, congestion or rhinorrhea.     Right Turbinates: Enlarged and swollen.     Left Turbinates: Not enlarged or swollen.     Right Sinus: No maxillary sinus tenderness or frontal sinus  tenderness.     Left Sinus: No maxillary sinus tenderness or frontal sinus tenderness.     Mouth/Throat:     Mouth: Mucous membranes are moist.     Pharynx: Posterior oropharyngeal erythema (cobblestoning of throat) present. No pharyngeal swelling or oropharyngeal exudate.     Tonsils: No tonsillar exudate.  Eyes:     Extraocular Movements: Extraocular movements intact.  Conjunctiva/sclera: Conjunctivae normal.     Pupils: Pupils are equal, round, and reactive to light.  Neck:     Thyroid: No thyroid mass.  Cardiovascular:     Rate and Rhythm: Normal rate and regular rhythm.  Pulmonary:     Effort: Pulmonary effort is normal.     Breath sounds: Normal breath sounds.  Abdominal:     Tenderness: There is no abdominal tenderness.     Comments: No epigastric tenderness  Lymphadenopathy:     Cervical:     Right cervical: No superficial cervical adenopathy.    Left cervical: No superficial cervical adenopathy.  Skin:    Findings: Rash (between webbing and along lateral side of third metacarapl and medial side of fourth metacarpal with erythema and vesicular lesion.) present.  Neurological:     Mental Status: She is alert.     BP (!) 110/58   Pulse 68   Temp 98.3 F (36.8 C)   Resp 16   Ht 5' 6.5" (1.689 m)   Wt 162 lb 4 oz (73.6 kg)   SpO2 96%   BMI 25.80 kg/m  Wt Readings from Last 3 Encounters:  11/14/21 162 lb 4 oz (73.6 kg)  06/26/21 166 lb 4 oz (75.4 kg)  06/18/20 167 lb (75.8 kg)     Health Maintenance Due  Topic Date Due   COVID-19 Vaccine (4 - Moderna risk series) 03/27/2020   INFLUENZA VACCINE  10/15/2021    There are no preventive care reminders to display for this patient.  Lab Results  Component Value Date   TSH 0.78 06/26/2021   Lab Results  Component Value Date   WBC 6.4 06/26/2021   HGB 14.4 06/26/2021   HCT 44.2 06/26/2021   MCV 91.7 06/26/2021   PLT 191.0 06/26/2021   Lab Results  Component Value Date   NA 140 06/26/2021   K 4.5  06/26/2021   CO2 31 06/26/2021   GLUCOSE 99 06/26/2021   BUN 21 06/26/2021   CREATININE 0.99 06/26/2021   BILITOT 1.1 06/26/2021   ALKPHOS 66 06/26/2021   AST 18 06/26/2021   ALT 16 06/26/2021   PROT 6.9 06/26/2021   ALBUMIN 4.6 06/26/2021   CALCIUM 9.5 06/26/2021   ANIONGAP 9 03/29/2020   GFR 56.22 (L) 06/26/2021   Lab Results  Component Value Date   CHOL 190 06/26/2021   Lab Results  Component Value Date   HDL 67.90 06/26/2021   Lab Results  Component Value Date   LDLCALC 95 06/26/2021   Lab Results  Component Value Date   TRIG 136.0 06/26/2021   Lab Results  Component Value Date   CHOLHDL 3 06/26/2021   Lab Results  Component Value Date   HGBA1C 5.6 03/06/2020      Assessment & Plan:   Problem List Items Addressed This Visit       Respiratory   Seasonal allergic rhinitis    Start flonase 50 mcg once daily  F/u in three weeks       Relevant Medications   fluticasone (FLONASE) 50 MCG/ACT nasal spray     Musculoskeletal and Integument   Irritant contact dermatitis due to other agents - Primary    Likely poison sumac.  rx prednisone 20 mg Pt advised of the following. Please monitor site for worsening signs/symptoms of infection to include: increasing redness, increasing tenderness, increase in size, and or pustulant drainage from site. If this is to occur please let me know immediately.  Relevant Medications   predniSONE (DELTASONE) 20 MG tablet     Other   Heartburn    Trial omeprazole 20 mg once daily for thirty days.  Try to decrease and or avoid spicy foods, fried fatty foods, and also caffeine and chocolate as these can increase heartburn symptoms.        Relevant Medications   omeprazole (PRILOSEC) 20 MG capsule    Meds ordered this encounter  Medications   predniSONE (DELTASONE) 20 MG tablet    Sig: Take two tablets po qd for four days, then one tablet po qd for three days    Dispense:  11 tablet    Refill:  0    Order  Specific Question:   Supervising Provider    Answer:   BEDSOLE, AMY E [[id number]]   omeprazole (PRILOSEC) 20 MG capsule    Sig: Take 1 capsule (20 mg total) by mouth daily.    Dispense:  30 capsule    Refill:  0    Order Specific Question:   Supervising Provider    Answer:   BEDSOLE, AMY E [[id number]]   DISCONTD: fluticasone (FLONASE) 50 MCG/ACT nasal spray    Sig: Place 2 sprays into both nostrils daily.    Dispense:  16 g    Refill:  6    Order Specific Question:   Supervising Provider    Answer:   BEDSOLE, AMY E [[id number]]   fluticasone (FLONASE) 50 MCG/ACT nasal spray    Sig: Place 2 sprays into both nostrils daily.    Dispense:  16 g    Refill:  6    Order Specific Question:   Supervising Provider    Answer:   Diona Browner, AMY E [[id number]]    Follow-up: Return in about 3 weeks (around 12/05/2021) for follow up in office .    Eugenia Pancoast, FNP

## 2021-11-14 NOTE — Assessment & Plan Note (Addendum)
Trial omeprazole 20 mg once daily for thirty days.  Try to decrease and or avoid spicy foods, fried fatty foods, and also caffeine and chocolate as these can increase heartburn symptoms.   I evaluated the patient,  was consulted regarding plans for treatment of care, and agree with the assessment and plan per Tinnie Gens, RN, DNP student.   Eugenia Pancoast, FNP-C

## 2021-11-14 NOTE — Assessment & Plan Note (Signed)
Start flonase 50 mcg once daily  F/u in three weeks

## 2021-11-14 NOTE — Progress Notes (Signed)
Established Patient Office Visit  Subjective   Patient ID: Felicia Ross, female    DOB: [birth year]  Age: 76 y.o. MRN: [id number]  Chief Complaint  Patient presents with   Sore Throat    X many years   Cough    X many years    HPI  Felicia Ross is a 76 year old female with past medical history of hypothyroidism, hypercholesteremia, affective bipolar disorder, who presents to the office for a cough and sore throat that she has had for many years. She has noticed that her symptoms have worsened. Her cough is dry and feels like "saliva hangs and is not going down". She denies any painful swallowing or difficulty swallowing. No exposure to covid or influenza.   She carries a cough drop and peppermint which helps stop the coughing spell. She has not tried any OTC cough syrups.   Cough is not worse after eating but she is burping and belching all time. There are no aggravating factors.    Review of Systems  Constitutional:  Negative for chills and fever.  HENT:  Negative for congestion, hearing loss, sinus pain, sore throat and tinnitus.   Respiratory:  Positive for cough. Negative for shortness of breath and wheezing.   Cardiovascular: Negative.   Gastrointestinal:  Negative for heartburn, nausea and vomiting.       Belching and burping  Neurological:  Negative for dizziness and headaches.      Objective:     BP (!) 110/58   Pulse 68   Temp 98.3 F (36.8 C)   Resp 16   Ht 5' 6.5" (1.689 m)   Wt 162 lb 4 oz (73.6 kg)   SpO2 96%   BMI 25.80 kg/m  BP Readings from Last 3 Encounters:  11/14/21 (!) 110/58  06/26/21 128/70  06/18/20 138/80      Physical Exam Vitals and nursing note reviewed.  Cardiovascular:     Rate and Rhythm: Normal rate and regular rhythm.  Pulmonary:     Effort: Pulmonary effort is normal.     Breath sounds: Normal breath sounds.  Abdominal:     General: Bowel sounds are normal.     Palpations: Abdomen is soft.     Tenderness: There is  no abdominal tenderness.  Neurological:     Mental Status: She is alert and oriented to person, place, and time.     No results found for any visits on 11/14/21.   The 10-year ASCVD risk score (Arnett DK, et al., 2019) is: 10.8%    Assessment & Plan:   Problem List Items Addressed This Visit       Respiratory   Seasonal allergic rhinitis    Start flonase 50 mcg once daily  F/u in three weeks       Relevant Medications   fluticasone (FLONASE) 50 MCG/ACT nasal spray     Musculoskeletal and Integument   Irritant contact dermatitis due to other agents - Primary    Likely poison sumac.  rx prednisone 20 mg Pt advised of the following. Please monitor site for worsening signs/symptoms of infection to include: increasing redness, increasing tenderness, increase in size, and or pustulant drainage from site. If this is to occur please let me know immediately.        Relevant Medications   predniSONE (DELTASONE) 20 MG tablet     Other   Heartburn    Trial omeprazole 20 mg once daily for thirty days.  Try to decrease  and or avoid spicy foods, fried fatty foods, and also caffeine and chocolate as these can increase heartburn symptoms.   I evaluated the patient,  was consulted regarding plans for treatment of care, and agree with the assessment and plan per Tinnie Gens, RN, DNP student.   Eugenia Pancoast, FNP-C          Relevant Medications   omeprazole (PRILOSEC) 20 MG capsule    Return in about 3 weeks (around 12/05/2021) for follow up in office .    Tinnie Gens, BSN-RN, DNP STUDENT

## 2021-11-14 NOTE — Assessment & Plan Note (Signed)
Likely poison sumac.  rx prednisone 20 mg Pt advised of the following. Please monitor site for worsening signs/symptoms of infection to include: increasing redness, increasing tenderness, increase in size, and or pustulant drainage from site. If this is to occur please let me know immediately.

## 2021-11-14 NOTE — Patient Instructions (Signed)
I started you on flonase 50 mcg daily.   I am also sending in some prednisone for you to start for your rash on your finger Please monitor site for worsening signs/symptoms of infection to include: increasing redness, increasing tenderness, increase in size, and or pustulant drainage from site. If this is to occur please let me know immediately.   Please start trial of omeprazole 20 mg once daily as prescribed. Take this medication for four weeks, administer 30 minutes prior to breakfast each am. Try to decrease and or avoid spicy foods, fried fatty foods, and also caffeine and chocolate as these can increase heartburn symptoms.   Due to recent changes in healthcare laws, you may see results of your imaging and/or laboratory studies on MyChart before I have had a chance to review them.  I understand that in some cases there may be results that are confusing or concerning to you. Please understand that not all results are received at the same time and often I may need to interpret multiple results in order to provide you with the best plan of care or course of treatment. Therefore, I ask that you please give me 2 business days to thoroughly review all your results before contacting my office for clarification. Should we see a critical lab result, you will be contacted sooner.   It was a pleasure seeing you today! Please do not hesitate to reach out with any questions and or concerns.  Regards,   Eugenia Pancoast FNP-C

## 2021-12-09 ENCOUNTER — Ambulatory Visit (INDEPENDENT_AMBULATORY_CARE_PROVIDER_SITE_OTHER)
Admission: RE | Admit: 2021-12-09 | Discharge: 2021-12-09 | Disposition: A | Discharge status: Home / Self Care | Payer: Medicare HMO | Source: Ambulatory Visit | Attending: Family | Admitting: Family

## 2021-12-09 ENCOUNTER — Ambulatory Visit (INDEPENDENT_AMBULATORY_CARE_PROVIDER_SITE_OTHER): Payer: Medicare HMO | Admitting: Family

## 2021-12-09 ENCOUNTER — Encounter: Payer: Self-pay | Admitting: Family

## 2021-12-09 VITALS — BP 128/66 | HR 67 | Temp 97.9°F | Resp 16 | Ht 66.5 in | Wt 164.5 lb

## 2021-12-09 DIAGNOSIS — Z7722 Contact with and (suspected) exposure to environmental tobacco smoke (acute) (chronic): Secondary | ICD-10-CM

## 2021-12-09 DIAGNOSIS — R053 Chronic cough: Secondary | ICD-10-CM

## 2021-12-09 DIAGNOSIS — J302 Other seasonal allergic rhinitis: Secondary | ICD-10-CM

## 2021-12-09 DIAGNOSIS — J439 Emphysema, unspecified: Secondary | ICD-10-CM | POA: Diagnosis not present

## 2021-12-09 MED ORDER — AZELASTINE HCL 0.1 % NA SOLN: 1.0000 | Freq: Two times a day (BID) | NASAL | 12 refills | Status: DC

## 2021-12-09 NOTE — Assessment & Plan Note (Signed)
Advised patient to continue Flonase 50 mcg Sent in prescription for Astelin 0.1% nasal spray We will consider Singulair if mild improvement with Astelin in addition and not complete improvement.

## 2021-12-09 NOTE — Patient Instructions (Addendum)
I want to start you on astelin spray as well.   Continue flonase.   Complete xray(s) prior to leaving today. I will notify you of your results once received.   Regards,   Eugenia Pancoast FNP-C

## 2021-12-09 NOTE — Assessment & Plan Note (Signed)
Ongoing Chest x-ray today to rule out asthma versus COPD or other etiology.

## 2021-12-09 NOTE — Progress Notes (Signed)
Established Patient Office Visit  Subjective:  Patient ID: Felicia Ross, female    DOB: 24-Jun-1945  Age: 76 y.o. MRN: [id number]  CC:  Chief Complaint  Patient presents with   Poison Ivy    Follow up     HPI Felicia Ross is here today for follow up.   Pt is with acute concerns.  Still with ongoing post nasal drip.  Flonase two sprays each am, with some mild improvement. Still every am and finds she is blowing her nose a lot. Stlil with chronic dry cough, feels a tickle in her lower throat. No asthma that she knows of in the past. She states coughing 'all of the time' throughout the day". Non smoker. Her father was a heavy smoker growing up so a lot of nicotine exposure.  Did try omeprazole but did not notice any change.   Poison oak completely cleaned up.   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   Procedure: TOTAL HIP ARTHROPLASTY ANTERIOR APPROACH;  Surgeon: Gaynelle Arabian, MD;  Location: [facility] ORS;  Service: Orthopedics;  Laterality: Right;  172mn   TUBAL LIGATION      Family History  Problem Relation Age of Onset   Ovarian cancer Mother    Hypertension Mother    Heart attack Father    Hypertension Brother    Breast cancer Maternal Aunt    Lung cancer Paternal Aunt    Breast cancer Paternal Aunt    Hypertension Other    Breast cancer Cousin     Social History   Socioeconomic History   Marital status: Married    Spouse name: Lewis    Number of children: 3   Years of education: College   Highest education level: Not on file   Occupational History   Occupation: Part-time  Tobacco Use   Smoking status: Never   Smokeless tobacco: Never  Vaping Use   Vaping Use: Never used  Substance and Sexual Activity   Alcohol use: No    Alcohol/week: 0.0 standard drinks of alcohol   Drug use: No   Sexual activity: Yes    Partners: Male    Birth control/protection: Post-menopausal  Other Topics Concern   Not on file  Social History Narrative   Not on file   Social Determinants of Health   Financial Resource Strain: Low Risk  (07/18/2021)   Overall Financial Resource Strain (CARDIA)    Difficulty of Paying Living Expenses: Not hard at all  Food Insecurity: No Food Insecurity (07/18/2021)   Hunger Vital Sign    Worried About Running Out of Food in the Last Year: Never true    RElmerin the Last Year: Never true  Transportation Needs: No Transportation Needs (07/18/2021)   PRAPARE - THydrologist(Medical): No    Lack of Transportation (Non-Medical): No  Physical Activity: Insufficiently Active (07/18/2021)   Exercise Vital Sign    Days of Exercise per Week: 3 days    Minutes of  Exercise per Session: 20 min  Stress: No Stress Concern Present (07/18/2021)   Mobile    Feeling of Stress : Not at all  Social Connections: [place] (07/18/2021)   Social Connection and Isolation Panel [NHANES]    Frequency of Communication with Friends and Family: More than three times a week    Frequency of Social Gatherings with Friends and Family: Three times a week    Attends Religious Services: More than 4 times per year    Active Member of Clubs or Organizations: Yes    Attends Archivist Meetings: More than 4 times per year    Marital Status: Married  Human resources officer Violence: Not At Risk (07/18/2021)   Humiliation, Afraid, Rape, and Kick questionnaire    Fear of Current or Ex-Partner: No    Emotionally Abused:  No    Physically Abused: No    Sexually Abused: No    Outpatient Medications Prior to Visit  Medication Sig Dispense Refill   Cholecalciferol (VITAMIN D) 2000 units CAPS Take 1 capsule by mouth daily.     fluticasone (FLONASE) 50 MCG/ACT nasal spray Place 2 sprays into both nostrils daily. 16 g 6   levothyroxine (SYNTHROID) 112 MCG tablet Take 1 tablet (112 mcg total) by mouth daily. 90 tablet 1   lithium carbonate (LITHOBID) 300 MG CR tablet TAKE 1 TABLET TWICE DAILY 180 tablet 1   mirtazapine (REMERON) 15 MG tablet Take 1 tablet (15 mg total) by mouth at bedtime. 90 tablet 1   Multiple Vitamin (MULTIVITAMIN) tablet Take 1 tablet by mouth daily.     Omega-3 Fatty Acids (FISH OIL) 1200 MG CAPS Take 1,200 mg by mouth 2 (two) times daily.      simvastatin (ZOCOR) 10 MG tablet Take 1 tablet (10 mg total) by mouth at bedtime. 90 tablet 1   omeprazole (PRILOSEC) 20 MG capsule Take 1 capsule (20 mg total) by mouth daily. 30 capsule 0   predniSONE (DELTASONE) 20 MG tablet Take two tablets po qd for four days, then one tablet po qd for three days (Patient not taking: Reported on 12/09/2021) 11 tablet 0   No facility-administered medications prior to visit.    Allergies  Allergen Reactions   Fluzone [Influenza Virus Vaccine] Hives    High dose only---hives, itchy          Objective:    Physical Exam Constitutional:      General: She is not in acute distress.    Appearance: Normal appearance. She is normal weight. She is not ill-appearing.  HENT:     Right Ear: Tympanic membrane normal.     Left Ear: Tympanic membrane normal.     Nose: Nose normal. No congestion or rhinorrhea.     Right Turbinates: Not enlarged or swollen.     Left Turbinates: Not enlarged or swollen.     Right Sinus: No maxillary sinus tenderness or frontal sinus tenderness.     Left Sinus: No maxillary sinus tenderness or frontal sinus tenderness.     Mouth/Throat:     Mouth: Mucous membranes are moist.      Pharynx: No pharyngeal swelling, oropharyngeal exudate or posterior oropharyngeal erythema.     Tonsils: No tonsillar exudate.  Eyes:     Extraocular Movements: Extraocular movements intact.     Conjunctiva/sclera: Conjunctivae normal.     Pupils: Pupils are equal, round, and reactive to light.  Neck:     Thyroid: No  thyroid mass.  Cardiovascular:     Rate and Rhythm: Normal rate and regular rhythm.  Pulmonary:     Effort: Pulmonary effort is normal.     Breath sounds: Normal breath sounds.  Lymphadenopathy:     Cervical:     Right cervical: No superficial cervical adenopathy.    Left cervical: No superficial cervical adenopathy.  Neurological:     Mental Status: She is alert.    BP 128/66   Pulse 67   Temp 97.9 F (36.6 C)   Resp 16   Ht 5' 6.5" (1.689 m)   Wt 164 lb 8 oz (74.6 kg)   SpO2 95%   BMI 26.15 kg/m  Wt Readings from Last 3 Encounters:  12/09/21 164 lb 8 oz (74.6 kg)  11/14/21 162 lb 4 oz (73.6 kg)  06/26/21 166 lb 4 oz (75.4 kg)     Health Maintenance Due  Topic Date Due   COVID-19 Vaccine (4 - Moderna risk series) 03/27/2020    There are no preventive care reminders to display for this patient.  Lab Results  Component Value Date   TSH 0.78 06/26/2021   Lab Results  Component Value Date   WBC 6.4 06/26/2021   HGB 14.4 06/26/2021   HCT 44.2 06/26/2021   MCV 91.7 06/26/2021   PLT 191.0 06/26/2021   Lab Results  Component Value Date   NA 140 06/26/2021   K 4.5 06/26/2021   CO2 31 06/26/2021   GLUCOSE 99 06/26/2021   BUN 21 06/26/2021   CREATININE 0.99 06/26/2021   BILITOT 1.1 06/26/2021   ALKPHOS 66 06/26/2021   AST 18 06/26/2021   ALT 16 06/26/2021   PROT 6.9 06/26/2021   ALBUMIN 4.6 06/26/2021   CALCIUM 9.5 06/26/2021   ANIONGAP 9 03/29/2020   GFR 56.22 (L) 06/26/2021   Lab Results  Component Value Date   CHOL 190 06/26/2021   Lab Results  Component Value Date   HDL 67.90 06/26/2021   Lab Results  Component Value Date    LDLCALC 95 06/26/2021   Lab Results  Component Value Date   TRIG 136.0 06/26/2021   Lab Results  Component Value Date   CHOLHDL 3 06/26/2021   Lab Results  Component Value Date   HGBA1C 5.6 03/06/2020      Assessment & Plan:   Problem List Items Addressed This Visit       Respiratory   Seasonal allergic rhinitis    Advised patient to continue Flonase 50 mcg Sent in prescription for Astelin 0.1% nasal spray We will consider Singulair if mild improvement with Astelin in addition and not complete improvement.      Relevant Medications   azelastine (ASTELIN) 0.1 % nasal spray   Other Relevant Orders   DG Chest 2 View     Other   Chronic cough    Ongoing Chest x-ray today to rule out asthma versus COPD or other etiology.      Relevant Orders   DG Chest 2 View   Exposure to cigarette smoke - Primary   Relevant Orders   DG Chest 2 View    Meds ordered this encounter  Medications   azelastine (ASTELIN) 0.1 % nasal spray    Sig: Place 1 spray into both nostrils 2 (two) times daily. Use in each nostril as directed    Dispense:  30 mL    Refill:  12    Order Specific Question:   Supervising Provider    Answer:  BEDSOLE, AMY E [[id number]]    Follow-up: Return in about 6 months (around 06/09/2022) for f/u cholesterol.    Eugenia Pancoast, FNP

## 2021-12-11 ENCOUNTER — Other Ambulatory Visit: Payer: Self-pay | Admitting: Family

## 2021-12-11 DIAGNOSIS — R12 Heartburn: Secondary | ICD-10-CM

## 2021-12-11 NOTE — Progress Notes (Signed)
X-ray did show emphysema with no acute cardiovascular or pulmonary disease which is good.  Have her take an inhaler on a daily basis for asked emphysema?  Consider you have a chronic cough this might not be a terrible idea.

## 2021-12-18 ENCOUNTER — Other Ambulatory Visit: Payer: Self-pay

## 2021-12-18 ENCOUNTER — Other Ambulatory Visit: Payer: Self-pay | Admitting: Family

## 2021-12-18 DIAGNOSIS — J438 Other emphysema: Secondary | ICD-10-CM

## 2021-12-18 MED ORDER — UMECLIDINIUM-VILANTEROL 62.5-25 MCG/ACT IN AEPB: 1.0000 | INHALATION_SPRAY | Freq: Every day | RESPIRATORY_TRACT | 5 refills | Status: DC

## 2021-12-18 NOTE — Progress Notes (Signed)
What inhaler did we tell her to take daily? I meant to say is she taking her albuterol on a daily basis due to sob? I don't want her taking the emergency inhaler daily.   I wanted to suggest maybe starting her on a daily inhaler.

## 2021-12-18 NOTE — Progress Notes (Signed)
Advise pt I sent her in an inhaler to start taking daily to see if this helps with her sob and emphysema. Sent to [facility] [address] in [place].

## 2021-12-19 ENCOUNTER — Other Ambulatory Visit: Payer: Self-pay | Admitting: Family

## 2021-12-19 DIAGNOSIS — J438 Other emphysema: Secondary | ICD-10-CM

## 2021-12-19 MED ORDER — UMECLIDINIUM-VILANTEROL 62.5-25 MCG/ACT IN AEPB: 1.0000 | INHALATION_SPRAY | Freq: Every day | RESPIRATORY_TRACT | 5 refills | Status: DC

## 2021-12-24 ENCOUNTER — Telehealth: Payer: Self-pay | Admitting: Family

## 2021-12-24 DIAGNOSIS — R053 Chronic cough: Secondary | ICD-10-CM

## 2021-12-24 DIAGNOSIS — J438 Other emphysema: Secondary | ICD-10-CM

## 2021-12-24 MED ORDER — STIOLTO RESPIMAT 2.5-2.5 MCG/ACT IN AERS: 2.0000 | INHALATION_SPRAY | Freq: Every day | RESPIRATORY_TRACT | 5 refills | Status: DC

## 2021-12-24 NOTE — Addendum Note (Signed)
Addended by: Eugenia Pancoast on: 12/24/2021 04:27 PM   Modules accepted: Orders

## 2021-12-24 NOTE — Telephone Encounter (Signed)
Patient called in stating that she was seen 2 weeks ago by [place]. She stated that the medication she was prescribed not helping much and that the inhaler was $400-$500 so she didn't get it and wanted to know if an alternative could be used. Thank you!

## 2021-12-24 NOTE — Telephone Encounter (Signed)
Sending in a new prescription for stiolto respimat which is another inhaler.  Thank you for letting me know that this was not covered and was very expensive as I do not want you to ever pay not much money for an inhaler.  Please let me know if this 1 is better pricing

## 2021-12-25 NOTE — Telephone Encounter (Signed)
Called and informed and pt said she would let us know if it is to expensive.

## 2021-12-25 NOTE — Telephone Encounter (Signed)
Patient called back in and stated that prescription is $45 and she will pick up inhaler.

## 2021-12-25 NOTE — Telephone Encounter (Signed)
Noted  

## 2022-01-01 ENCOUNTER — Other Ambulatory Visit: Payer: Self-pay | Admitting: Family

## 2022-01-01 DIAGNOSIS — E890 Postprocedural hypothyroidism: Secondary | ICD-10-CM

## 2022-01-02 IMAGING — DX DG HIP (WITH OR WITHOUT PELVIS) 2-3V*R*
3 series · 3 of 3 positions shown · non-contrast
Comparison: None.

CLINICAL DATA: Chronic pain

EXAM:
DG HIP (WITH OR WITHOUT PELVIS) 2-3V RIGHT

[pelvis ap]
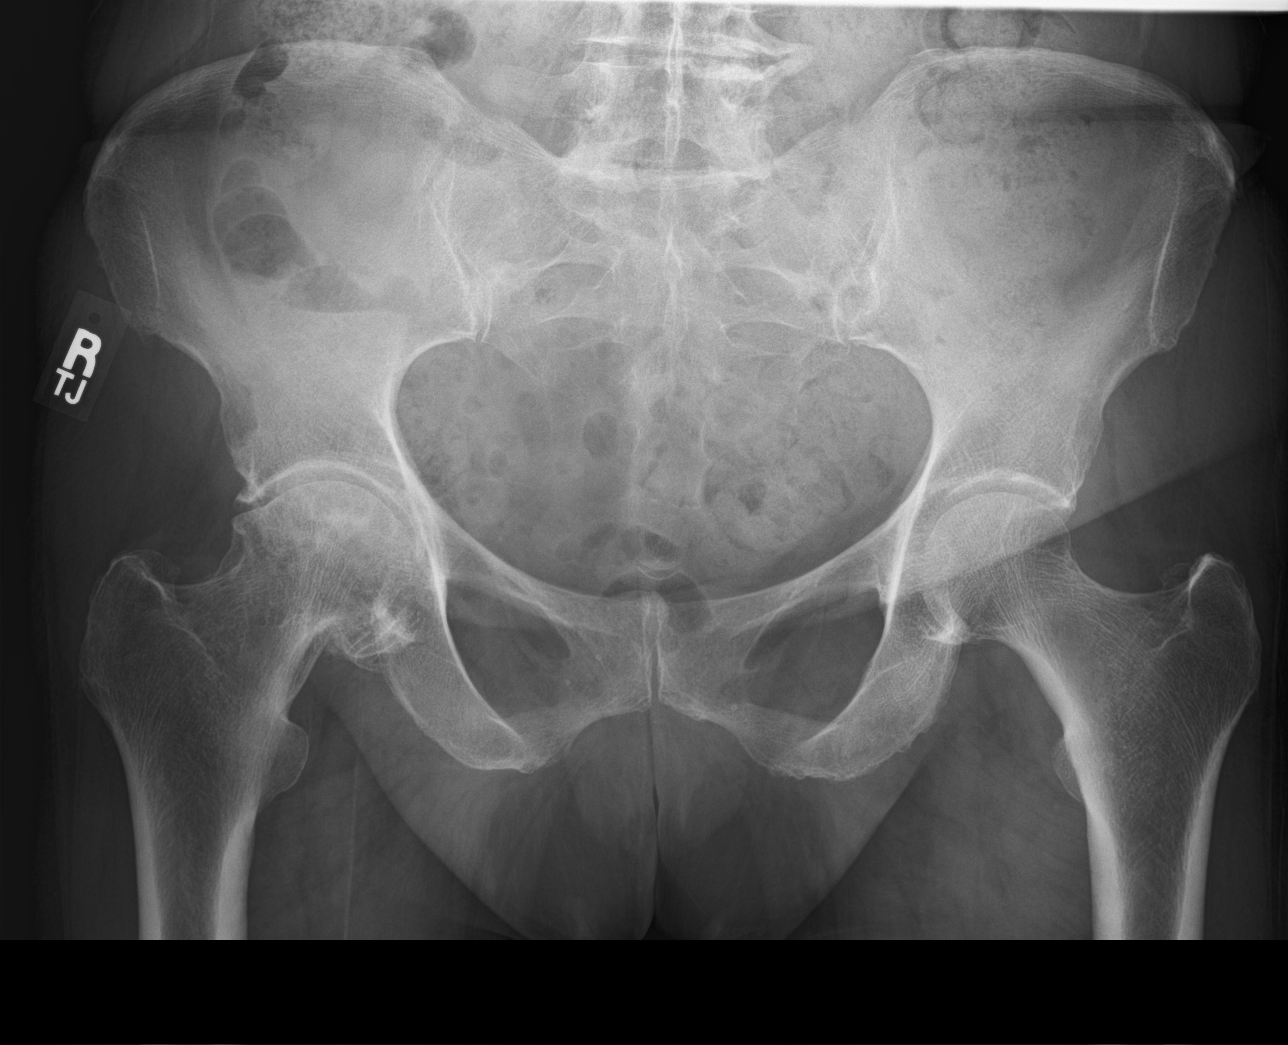

[hip ap]
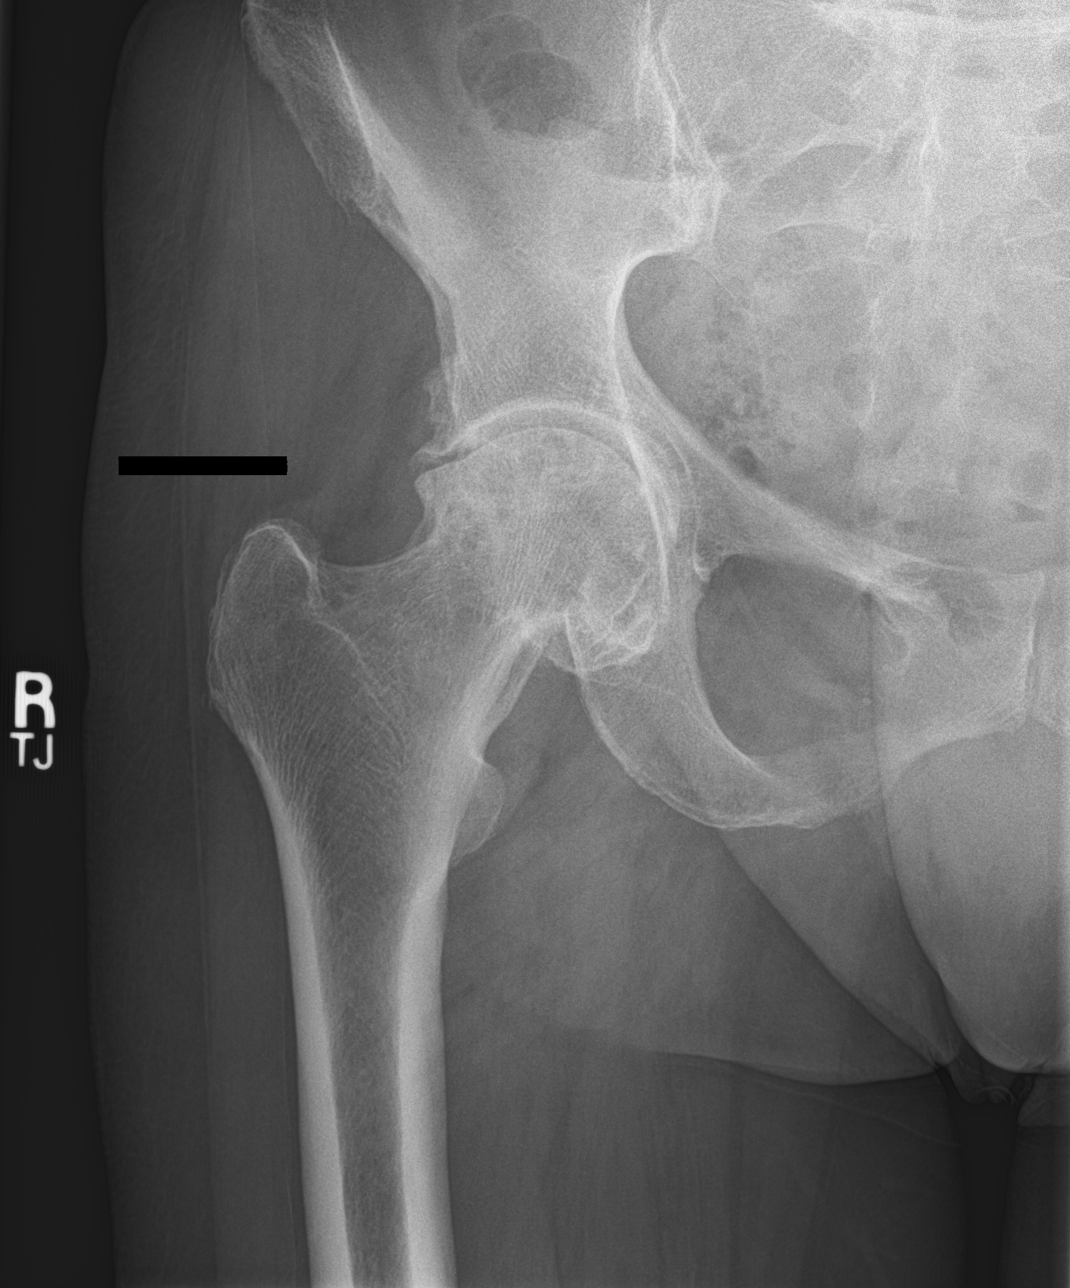

[hip lat]
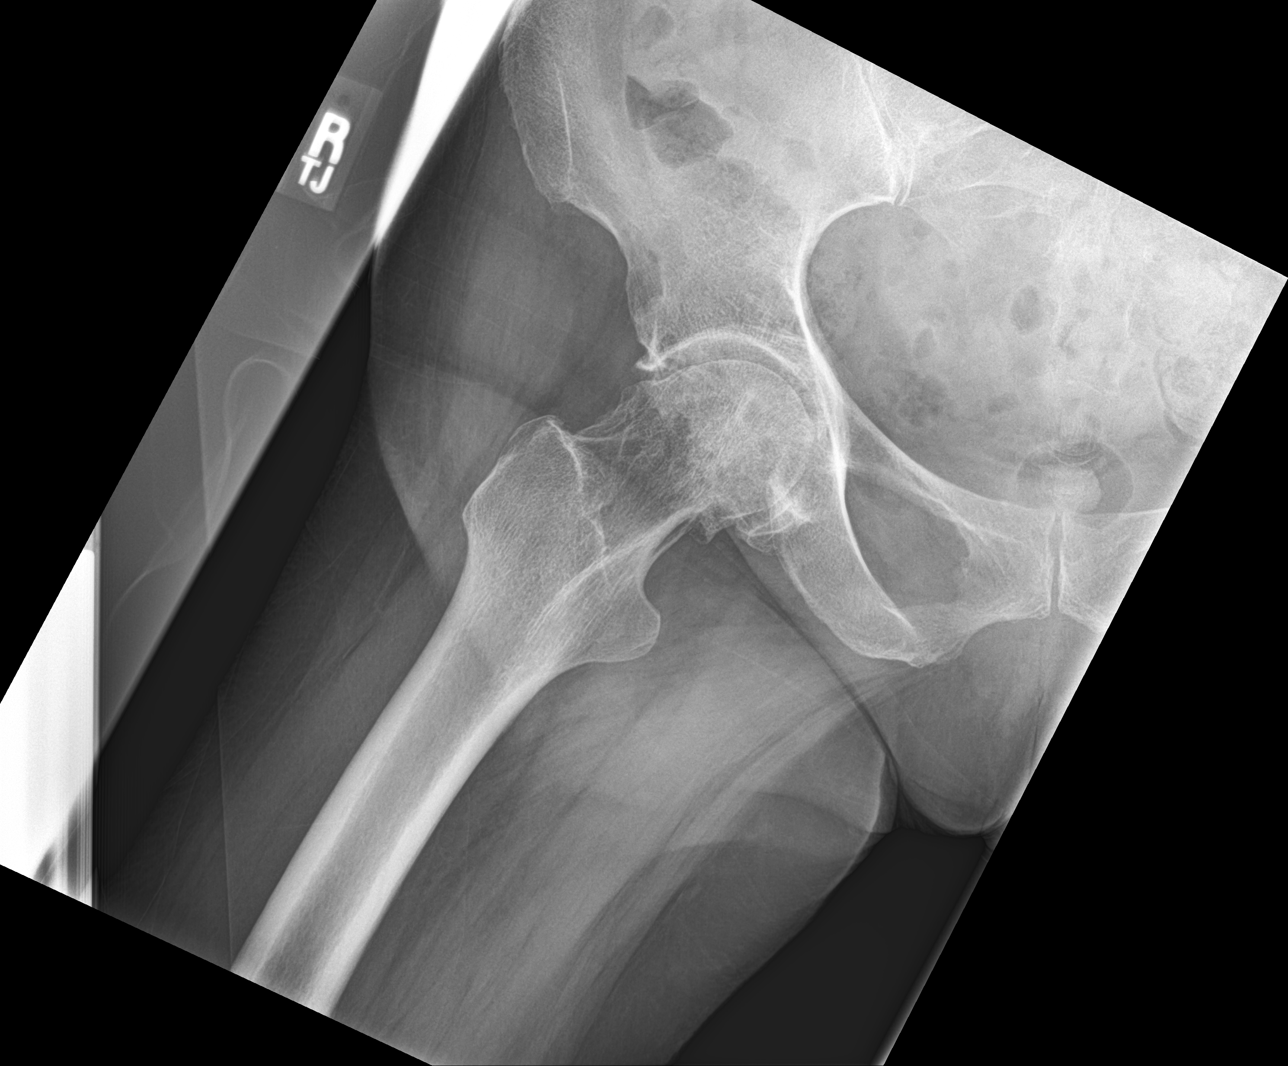

[3 of 3 positions shown; findings below may reference images not displayed]

FINDINGS: Weightbearing frontal pelvis as well as weightbearing frontal and
weight-bearing lateral right hip images. No fracture or dislocation.
There is moderate narrowing of the right hip joint with remodeling
consistent with osteoarthritic change. There is subchondral cystic
change in the right femoral head with mild sclerosis in this area of
subchondral cystic change. On the left, there is minimal joint space
narrowing without remodeling. The sacroiliac joints bilaterally
appear normal.
IMPRESSION: Moderately severe osteoarthritic change in the right hip joint with
a degree of remodeling. Subchondral cystic change noted in the right
femoral head. Mild associated sclerosis in this area could indicate
a degree of superimposed avascular necrosis. From an imaging
standpoint, MR is the optimum imaging study to assess for potential
avascular necrosis.

Minimal narrowing left hip joint. No fracture or dislocation
evident.

## 2022-01-08 ENCOUNTER — Other Ambulatory Visit: Payer: Self-pay | Admitting: Family

## 2022-01-08 DIAGNOSIS — E7800 Pure hypercholesterolemia, unspecified: Secondary | ICD-10-CM

## 2022-01-08 DIAGNOSIS — F3170 Bipolar disorder, currently in remission, most recent episode unspecified: Secondary | ICD-10-CM

## 2022-01-24 ENCOUNTER — Telehealth: Payer: Self-pay

## 2022-01-24 NOTE — Telephone Encounter (Signed)
Pt called back returning [facility] call. Told pt Dugal's response. Pt stated the meds aren't helping & still feeling the same. Pt states she still having dry throat, which causes her to get chocked up & a cough. Pt stated the only thing that'll help her throat, is a peppermint or hard candy. Call back # [phone].

## 2022-01-24 NOTE — Telephone Encounter (Signed)
-----   Message from Eugenia Pancoast, [place] sent at 01/24/2022  1:21 PM EST ----- Please call to ask pt how she is doing with astelin and flonase.  Is this improving her symptoms?   ----- Message ----- From: Eugenia Pancoast, FNP Sent: 12/09/2021   9:02 AM EST To: Eugenia Pancoast, FNP  How is pt doing with astelin?  She is on flonase.  Might conisder sinuglair if some improvemnet but not quite 100 % with pnd

## 2022-01-24 NOTE — Telephone Encounter (Signed)
Left message to return call to our office.  

## 2022-01-27 ENCOUNTER — Telehealth: Payer: Self-pay | Admitting: Family

## 2022-01-27 NOTE — Telephone Encounter (Signed)
Patient called in and stated that Tiotropium Bromide-Olodaterol (STIOLTO RESPIMAT) 2.5-2.5 MCG/ACT AERS isn't helping like its suppose to help. She stated that January 1st she is changing medicare companies, January 15th her husband is having total knee replacement, and that March 1st will be better for her. Thank you!

## 2022-01-31 NOTE — Telephone Encounter (Signed)
Appt made for 02/14/2022

## 2022-01-31 NOTE — Telephone Encounter (Signed)
Have pt make appt

## 2022-02-12 DIAGNOSIS — Z85828 Personal history of other malignant neoplasm of skin: Secondary | ICD-10-CM | POA: Diagnosis not present

## 2022-02-12 DIAGNOSIS — D2262 Melanocytic nevi of left upper limb, including shoulder: Secondary | ICD-10-CM | POA: Diagnosis not present

## 2022-02-12 DIAGNOSIS — D225 Melanocytic nevi of trunk: Secondary | ICD-10-CM | POA: Diagnosis not present

## 2022-02-12 DIAGNOSIS — D2272 Melanocytic nevi of left lower limb, including hip: Secondary | ICD-10-CM | POA: Diagnosis not present

## 2022-02-12 DIAGNOSIS — L821 Other seborrheic keratosis: Secondary | ICD-10-CM | POA: Diagnosis not present

## 2022-02-12 DIAGNOSIS — I8393 Asymptomatic varicose veins of bilateral lower extremities: Secondary | ICD-10-CM | POA: Diagnosis not present

## 2022-02-14 ENCOUNTER — Encounter: Payer: Self-pay | Admitting: Family

## 2022-02-14 ENCOUNTER — Ambulatory Visit (INDEPENDENT_AMBULATORY_CARE_PROVIDER_SITE_OTHER): Payer: Medicare HMO | Admitting: Family

## 2022-02-14 ENCOUNTER — Telehealth: Payer: Self-pay

## 2022-02-14 VITALS — BP 130/66 | HR 73 | Temp 98.3°F | Resp 16 | Ht 66.5 in | Wt 168.4 lb

## 2022-02-14 DIAGNOSIS — R053 Chronic cough: Secondary | ICD-10-CM

## 2022-02-14 DIAGNOSIS — R0989 Other specified symptoms and signs involving the circulatory and respiratory systems: Secondary | ICD-10-CM

## 2022-02-14 DIAGNOSIS — J302 Other seasonal allergic rhinitis: Secondary | ICD-10-CM

## 2022-02-14 MED ORDER — MONTELUKAST SODIUM 10 MG PO TABS: 10.0000 mg | ORAL_TABLET | Freq: Every day | ORAL | 3 refills | Status: DC

## 2022-02-14 NOTE — Telephone Encounter (Signed)
Called pt and vm was full unable to leave a message to return call to the office.

## 2022-02-14 NOTE — Telephone Encounter (Signed)
-----   Message from Eugenia Pancoast, [place] sent at 02/14/2022 10:38 AM EST ----- Please advise pt I have placed a referral to an ENT for her issue. She should hear from them in the next 1-2 weeks if she does please have her let us know.

## 2022-02-14 NOTE — Progress Notes (Unsigned)
Established Patient Office Visit  Subjective:  Patient ID: Felicia Ross, female    DOB: 12/22/1945  Age: 76 y.o. MRN: [id number]  CC:  Chief Complaint  Patient presents with   dry mouth    She has always had it      HPI Felicia Ross is here today for follow up.   Pt still experiencing chronic dry cough and feeling tickle in her lower throat. She coughs throughout the day, and only relief is with a cough drop. Non smoker.   No relief with PPI.  No issues with swallowing pills food or liquid.  She has tried adding in flonase and astelin, also without relief.  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   Procedure: TOTAL HIP ARTHROPLASTY ANTERIOR APPROACH;  Surgeon: Gaynelle Arabian, MD;  Location: [facility] ORS;  Service: Orthopedics;  Laterality: Right;  140mn   TUBAL LIGATION      Family History  Problem Relation Age of Onset   Ovarian cancer Mother    Hypertension Mother    Heart attack Father    Hypertension Brother    Breast cancer Maternal Aunt    Lung cancer Paternal Aunt    Breast cancer Paternal Aunt    Hypertension Other    Breast cancer Cousin     Social History   Socioeconomic History   Marital status: Married    Spouse name: Lewis    Number of children: 3   Years of education: College   Highest education level: Not on file  Occupational History   Occupation: Part-time  Tobacco Use   Smoking status: Never   Smokeless tobacco: Never  Vaping Use   Vaping Use: Never used  Substance and  Sexual Activity   Alcohol use: No    Alcohol/week: 0.0 standard drinks of alcohol   Drug use: No   Sexual activity: Yes    Partners: Male    Birth control/protection: Post-menopausal  Other Topics Concern   Not on file  Social History Narrative   Not on file   Social Determinants of Health   Financial Resource Strain: Low Risk  (07/18/2021)   Overall Financial Resource Strain (CARDIA)    Difficulty of Paying Living Expenses: Not hard at all  Food Insecurity: No Food Insecurity (07/18/2021)   Hunger Vital Sign    Worried About Running Out of Food in the Last Year: Never true    [place] the Last Year: Never true  Transportation Needs: No Transportation Needs (07/18/2021)   PRAPARE - THydrologist(Medical): No    Lack of Transportation (Non-Medical): No  Physical Activity: Insufficiently Active (07/18/2021)   Exercise Vital Sign    Days of Exercise per Week: 3 days    Minutes of Exercise per Session: 20 min  Stress: No Stress Concern Present (07/18/2021)   [place]   Feeling of Stress : Not at  all  Social Connections: Socially Integrated (07/18/2021)   Social Connection and Isolation Panel [NHANES]    Frequency of Communication with Friends and Family: More than three times a week    Frequency of Social Gatherings with Friends and Family: Three times a week    Attends Religious Services: More than 4 times per year    Active Member of Clubs or Organizations: Yes    Attends Archivist Meetings: More than 4 times per year    Marital Status: Married  Human resources officer Violence: Not At Risk (07/18/2021)   Humiliation, Afraid, Rape, and Kick questionnaire    Fear of Current or Ex-Partner: No    Emotionally Abused: No    Physically Abused: No    Sexually Abused: No    Outpatient Medications Prior to Visit  Medication Sig Dispense Refill   Cholecalciferol (VITAMIN D) 2000  units CAPS Take 1 capsule by mouth daily.     levothyroxine (SYNTHROID) 112 MCG tablet TAKE 1 TABLET EVERY DAY 90 tablet 3   lithium carbonate (LITHOBID) 300 MG CR tablet TAKE 1 TABLET TWICE DAILY 180 tablet 1   mirtazapine (REMERON) 15 MG tablet TAKE 1 TABLET (15 MG TOTAL) BY MOUTH AT BEDTIME. 90 tablet 10   Multiple Vitamin (MULTIVITAMIN) tablet Take 1 tablet by mouth daily.     Omega-3 Fatty Acids (FISH OIL) 1200 MG CAPS Take 1,200 mg by mouth 2 (two) times daily.      simvastatin (ZOCOR) 10 MG tablet TAKE 1 TABLET (10 MG TOTAL) BY MOUTH AT BEDTIME. 90 tablet 10   azelastine (ASTELIN) 0.1 % nasal spray Place 1 spray into both nostrils 2 (two) times daily. Use in each nostril as directed 30 mL 12   fluticasone (FLONASE) 50 MCG/ACT nasal spray Place 2 sprays into both nostrils daily. 16 g 6   Tiotropium Bromide-Olodaterol (STIOLTO RESPIMAT) 2.5-2.5 MCG/ACT AERS Inhale 2 puffs into the lungs daily. 1 each 5   No facility-administered medications prior to visit.    Allergies  Allergen Reactions   Fluzone [Influenza Virus Vaccine] Hives    High dose only---hives, itchy          Objective:    Physical Exam Constitutional:      Appearance: Normal appearance.  HENT:     Right Ear: Hearing, ear canal and external ear normal. A middle ear effusion (clear) is present. Tympanic membrane is not erythematous.     Left Ear: Hearing, ear canal and external ear normal.     Mouth/Throat:     Pharynx: Posterior oropharyngeal erythema (cobblestoning slight) present. No pharyngeal swelling.     Tonsils: No tonsillar exudate.  Neck:     Trachea: Trachea normal.  Cardiovascular:     Rate and Rhythm: Normal rate and regular rhythm.  Pulmonary:     Effort: Pulmonary effort is normal.     Breath sounds: Normal breath sounds.  Musculoskeletal:     Cervical back: No muscular tenderness.  Lymphadenopathy:     Cervical: No cervical adenopathy.  Neurological:     General: No focal deficit  present.     Mental Status: She is alert and oriented to person, place, and time. Mental status is at baseline.  Psychiatric:        Mood and Affect: Mood normal.        Behavior: Behavior normal.        Thought Content: Thought content normal.        Judgment: Judgment normal.  BP 130/66   Pulse 73   Temp 98.3 F (36.8 C)   Resp 16   Ht 5' 6.5" (1.689 m)   Wt 168 lb 6 oz (76.4 kg)   SpO2 96%   BMI 26.77 kg/m  Wt Readings from Last 3 Encounters:  02/14/22 168 lb 6 oz (76.4 kg)  12/09/21 164 lb 8 oz (74.6 kg)  11/14/21 162 lb 4 oz (73.6 kg)     Health Maintenance Due  Topic Date Due   COVID-19 Vaccine (4 - 2023-24 season) 11/15/2021    There are no preventive care reminders to display for this patient.  Lab Results  Component Value Date   TSH 0.78 06/26/2021   Lab Results  Component Value Date   WBC 6.4 06/26/2021   HGB 14.4 06/26/2021   HCT 44.2 06/26/2021   MCV 91.7 06/26/2021   PLT 191.0 06/26/2021   Lab Results  Component Value Date   NA 140 06/26/2021   K 4.5 06/26/2021   CO2 31 06/26/2021   GLUCOSE 99 06/26/2021   BUN 21 06/26/2021   CREATININE 0.99 06/26/2021   BILITOT 1.1 06/26/2021   ALKPHOS 66 06/26/2021   AST 18 06/26/2021   ALT 16 06/26/2021   PROT 6.9 06/26/2021   ALBUMIN 4.6 06/26/2021   CALCIUM 9.5 06/26/2021   ANIONGAP 9 03/29/2020   GFR 56.22 (L) 06/26/2021   Lab Results  Component Value Date   CHOL 190 06/26/2021   Lab Results  Component Value Date   HDL 67.90 06/26/2021   Lab Results  Component Value Date   LDLCALC 95 06/26/2021   Lab Results  Component Value Date   TRIG 136.0 06/26/2021   Lab Results  Component Value Date   CHOLHDL 3 06/26/2021   Lab Results  Component Value Date   HGBA1C 5.6 03/06/2020      Assessment & Plan:   Problem List Items Addressed This Visit       Respiratory   RESOLVED: Seasonal allergic rhinitis     Other   Chronic cough - Primary   Chronic throat clearing     Meds ordered this encounter  Medications   DISCONTD: montelukast (SINGULAIR) 10 MG tablet    Sig: Take 1 tablet (10 mg total) by mouth at bedtime.    Dispense:  30 tablet    Refill:  3    Order Specific Question:   Supervising Provider    Answer:   Diona Browner, AMY E [[id number]]    Follow-up: Return in about 6 months (around 08/16/2022).    Eugenia Pancoast, FNP

## 2022-02-14 NOTE — Telephone Encounter (Signed)
Called and informed pt of this. She said she would call back if she has not heard anything within 2 weeks.

## 2022-02-14 NOTE — Telephone Encounter (Signed)
-----   Message from Eugenia Pancoast, [place] sent at 02/14/2022 10:38 AM EST ----- Please advise Felicia Ross I have placed a referral to an ENT for her issue. She should hear from them in the next 1-2 weeks if she does please have her let us know.

## 2022-02-17 NOTE — Assessment & Plan Note (Addendum)
No improvement with inhaler , and or with allergy medication or flonase. No improvement with ppi. Considered Singulair 10 mg however will hold for now pending workup with ent.

## 2022-02-17 NOTE — Assessment & Plan Note (Signed)
Referral to ent to eval /treat further  Possible ent vs speech therapy eval  Possible vocal cord dysfunction

## 2022-02-20 DIAGNOSIS — E890 Postprocedural hypothyroidism: Secondary | ICD-10-CM | POA: Diagnosis not present

## 2022-02-27 DIAGNOSIS — E890 Postprocedural hypothyroidism: Secondary | ICD-10-CM | POA: Diagnosis not present

## 2022-03-28 DIAGNOSIS — J301 Allergic rhinitis due to pollen: Secondary | ICD-10-CM | POA: Diagnosis not present

## 2022-03-28 DIAGNOSIS — R053 Chronic cough: Secondary | ICD-10-CM | POA: Diagnosis not present

## 2022-03-28 DIAGNOSIS — K219 Gastro-esophageal reflux disease without esophagitis: Secondary | ICD-10-CM | POA: Diagnosis not present

## 2022-03-28 DIAGNOSIS — J305 Allergic rhinitis due to food: Secondary | ICD-10-CM | POA: Diagnosis not present

## 2022-04-07 ENCOUNTER — Telehealth: Payer: Self-pay

## 2022-04-07 NOTE — Telephone Encounter (Signed)
Patient called to ask if we had received a lab result from Dr. Genoveva Ill office (ENT). I told her that I have not received anything but I was able to provide patient with our fax number and to call his office so they could fax it attention to me. I mentioned to the patient that once I would receive her information, that I will give it to Dr. Vicente Males to review so I could then call her back with any recommendations. Patient agreed and stated that she would do that. Patient then asked if she was due for her colonoscopy and I stated that she is due until 08/09/2022 and that she was on our recall list and that she would be receiving a notification by the end of February. Patient agreed and had no further questions.

## 2022-04-14 ENCOUNTER — Other Ambulatory Visit: Payer: Self-pay | Admitting: Family

## 2022-04-14 DIAGNOSIS — Z1231 Encounter for screening mammogram for malignant neoplasm of breast: Secondary | ICD-10-CM

## 2022-04-15 NOTE — Telephone Encounter (Signed)
We finally received Dr. Darien Ramus office note and he stated that he wanted patient to be seen for uncontrolled GERD. Therefore, I gave patient a call to schedule her an appointment with Dr. Vicente Males. Patient agreed and will come in on 04/17/2022 at 1:15 PM. Patient had no further questions.

## 2022-04-16 ENCOUNTER — Other Ambulatory Visit: Payer: Self-pay

## 2022-04-17 ENCOUNTER — Ambulatory Visit: Payer: Medicare HMO | Admitting: Gastroenterology

## 2022-04-17 ENCOUNTER — Encounter: Payer: Self-pay | Admitting: Gastroenterology

## 2022-04-17 VITALS — BP 132/80 | HR 100 | Temp 98.7°F | Ht 67.0 in | Wt 165.8 lb

## 2022-04-17 DIAGNOSIS — Z86010 Personal history of colonic polyps: Secondary | ICD-10-CM | POA: Diagnosis not present

## 2022-04-17 DIAGNOSIS — K219 Gastro-esophageal reflux disease without esophagitis: Secondary | ICD-10-CM | POA: Diagnosis not present

## 2022-04-17 NOTE — Progress Notes (Signed)
Felicia Bellows MD, MRCP(U.K) [address]  [place]  [place], [place] [address]  Main: [phone]  Fax: [phone]   Gastroenterology Consultation  Referring Provider:  Dr Pryor Ochoa Primary Care Physician:  Eugenia Pancoast, [place] Primary Gastroenterologist:  Dr. Jonathon Ross  Reason for Consultation:    GERD        HPI:   Felicia Ross is a 77 y.o. y/o female referred for GERD.   She was seen by Dr. Pryor Ochoa and diagnosed with laryngopharyngeal reflux commenced on omeprazole 20 mg a day she says that she has some discomfort in the upper part of her throat not relieved with the omeprazole she takes it once a day.  Denies any dysphagia.  No other upper GI symptoms.  She had a screening colonoscopy in May 2021 and over 3 polyps were resected there were tubular adenomas she is due for repeat colonoscopy.   Past Medical History:  Diagnosis Date   Arthritis    Bipolar disorder ([place])    Depression    Hypercholesteremia    Hypothyroidism    Osteopenia    Thyroid goiter     Past Surgical History:  Procedure Laterality Date   COLONOSCOPY WITH PROPOFOL N/A 08/09/2019   Procedure: COLONOSCOPY WITH PROPOFOL;  Surgeon: Felicia Bellows, MD;  Location: [facility] ENDOSCOPY;  Service: Gastroenterology;  Laterality: N/A;   THYROID SURGERY  03/17/2008   complete   TOTAL HIP ARTHROPLASTY Right 03/28/2020   Procedure: TOTAL HIP ARTHROPLASTY ANTERIOR APPROACH;  Surgeon: Gaynelle Arabian, MD;  Location: [facility] ORS;  Service: Orthopedics;  Laterality: Right;  137mn   TUBAL LIGATION      Prior to Admission medications   Medication Sig Start Date End Date Taking? Authorizing Provider  Cholecalciferol (VITAMIN D) 2000 units CAPS Take 1 capsule by mouth daily.    [provider]  levothyroxine (SYNTHROID) 112 MCG tablet TAKE 1 TABLET EVERY DAY 01/01/22   DEugenia Pancoast FNP  lithium carbonate (LITHOBID) 300 MG CR tablet TAKE 1 TABLET TWICE DAILY 10/21/21   DEugenia Pancoast FNP  mirtazapine (REMERON)  15 MG tablet TAKE 1 TABLET (15 MG TOTAL) BY MOUTH AT BEDTIME. 01/08/22   Dugal, Tabitha, FNP  montelukast (SINGULAIR) 10 MG tablet Take 10 mg by mouth at bedtime. 02/14/22   [provider]  Multiple Vitamin (MULTIVITAMIN) tablet Take 1 tablet by mouth daily.    [provider]  Omega-3 Fatty Acids (FISH OIL) 1200 MG CAPS Take 1,200 mg by mouth 2 (two) times daily.     [provider]  omeprazole (PRILOSEC) 20 MG capsule Take 20 mg by mouth daily. 03/28/22   [provider]  simvastatin (ZOCOR) 10 MG tablet TAKE 1 TABLET (10 MG TOTAL) BY MOUTH AT BEDTIME. 01/08/22   DEugenia Pancoast FNP    Family History  Problem Relation Age of Onset   Ovarian cancer Mother    Hypertension Mother    Heart attack Father    Hypertension Brother    Breast cancer Maternal Aunt    Lung cancer Paternal Aunt    Breast cancer Paternal Aunt    Hypertension Other    Breast cancer Cousin      Social History   Tobacco Use   Smoking status: Never   Smokeless tobacco: Never  Vaping Use   Vaping Use: Never used  Substance Use Topics   Alcohol use: No    Alcohol/week: 0.0 standard drinks of alcohol   Drug use: No    Allergies as of  04/17/2022 - Review Complete 04/17/2022  Allergen Reaction Noted   Fluzone [influenza virus vaccine] Hives 01/29/2016    Review of Systems:    All systems reviewed and negative except where noted in HPI.   Physical Exam:  BP 132/80   Pulse 100   Temp 98.7 F (37.1 C) (Oral)   Ht '5\' 7"'$  (1.702 m)   Wt 165 lb 12.8 oz (75.2 kg)   BMI 25.97 kg/m  No LMP recorded. Patient is postmenopausal. Psych:  Alert and cooperative. Normal mood and affect. General:   Alert,  Well-developed, well-nourished, pleasant and cooperative in NAD Head:  Normocephalic and atraumatic. Eyes:  Sclera clear, no icterus.   Conjunctiva pink. Ears:  Normal auditory acuity. Psych:  Alert and cooperative. Normal mood and affect.  Imaging Studies: No results  found.  Assessment and Plan:   Felicia Ross is a 77 y.o. y/o female has been referred for an upper endoscopy in the settings of evaluation for reflux after being evaluated by ENT and she is due for a surveillance colonoscopy due to personal history of colon polyps   Plan 1.  EGD and colonoscopy 2. GERD : Counseled on life style changes, suggest to use PPI first thing in the morning on empty stomach and eat 30 minutes after. Advised on the use of a wedge pillow at night , avoid meals for 2 hours prior to bed time.  Advised to increase omeprazole to 20 mg twice a day to see if it helps if it does not help after a week or 2 she can reduce it back to 20 mg once a day suggest to use a wedge pillow at night to keep the head end of the bed elevated. I have discussed alternative options, risks & benefits,  which include, but are not limited to, bleeding, infection, perforation,respiratory complication & drug reaction.  The patient agrees with this plan & written consent will be obtained.      Follow up in as needed  Dr Felicia Bellows MD,MRCP(U.K)

## 2022-04-17 NOTE — Patient Instructions (Addendum)
Please give me a call once you decide when you would like to schedule your procedure (upper endoscopy). Felicia Ross  [phone]  If you have questions about how much the procedure would cost you, please contact preservice at [phone].  Your diagnosis is GERD (Gastroesophageal Reflux Disease) K21.9 CPT code is 85631   Food Choices for Gastroesophageal Reflux Disease, Adult When you have gastroesophageal reflux disease (GERD), the foods you eat and your eating habits are very important. Choosing the right foods can help ease your discomfort. Think about working with a food expert (dietitian) to help you make good choices. What are tips for following this plan? Reading food labels Look for foods that are low in saturated fat. Foods that may help with your symptoms include: Foods that have less than 5% of daily value (DV) of fat. Foods that have 0 grams of trans fat. Cooking Do not fry your food. Cook your food by baking, steaming, grilling, or broiling. These are all methods that do not need a lot of fat for cooking. To add flavor, try to use herbs that are low in spice and acidity. Meal planning  Choose healthy foods that are low in fat, such as: Fruits and vegetables. Whole grains. Low-fat dairy products. Lean meats, fish, and poultry. Eat small meals often instead of eating 3 large meals each day. Eat your meals slowly in a place where you are relaxed. Avoid bending over or lying down until 2-3 hours after eating. Limit high-fat foods such as fatty meats or fried foods. Limit your intake of fatty foods, such as oils, butter, and shortening. Avoid the following as told by your doctor: Foods that cause symptoms. These may be different for different people. Keep a food diary to keep track of foods that cause symptoms. Alcohol. Drinking a lot of liquid with meals. Eating meals during the 2-3 hours before bed. Lifestyle Stay at a healthy weight. Ask your doctor what weight is healthy  for you. If you need to lose weight, work with your doctor to do so safely. Exercise for at least 30 minutes on 5 or more days each week, or as told by your doctor. Wear loose-fitting clothes. Do not smoke or use any products that contain nicotine or tobacco. If you need help quitting, ask your doctor. Sleep with the head of your bed higher than your feet. Use a wedge under the mattress or blocks under the bed frame to raise the head of the bed. Chew sugar-free gum after meals. What foods should eat?  Eat a healthy, well-balanced diet of fruits, vegetables, whole grains, low-fat dairy products, lean meats, fish, and poultry. Each person is different. Foods that may cause symptoms in one person may not cause any symptoms in another person. Work with your doctor to find foods that are safe for you. The items listed above may not be a complete list of what you can eat and drink. Contact a food expert for more options. What foods should I avoid? Limiting some of these foods may help in managing the symptoms of GERD. Everyone is different. Talk with a food expert or your doctor to help you find the exact foods to avoid, if any. Fruits Any fruits prepared with added fat. Any fruits that cause symptoms. For some people, this may include citrus fruits, such as oranges, grapefruit, pineapple, and lemons. Vegetables Deep-fried vegetables. [place] fries. Any vegetables prepared with added fat. Any vegetables that cause symptoms. For some people, this may include tomatoes and tomato products,  chili peppers, onions and garlic, and horseradish. Grains Pastries or quick breads with added fat. Meats and other proteins High-fat meats, such as fatty beef or pork, hot dogs, ribs, ham, sausage, salami, and bacon. Fried meat or protein, including fried fish and fried chicken. Nuts and nut butters, in large amounts. Dairy Whole milk and chocolate milk. Sour cream. Cream. Ice cream. Cream cheese. Milkshakes. Fats and  oils Butter. Margarine. Shortening. Ghee. Beverages Coffee and tea, with or without caffeine. Carbonated beverages. Sodas. Energy drinks. Fruit juice made with acidic fruits, such as orange or grapefruit. Tomato juice. Alcoholic drinks. Sweets and desserts Chocolate and cocoa. Donuts. Seasonings and condiments Pepper. Peppermint and spearmint. Added salt. Any condiments, herbs, or seasonings that cause symptoms. For some people, this may include curry, hot sauce, or vinegar-based salad dressings. The items listed above may not be a complete list of what you should not eat and drink. Contact a food expert for more options. Questions to ask your doctor Diet and lifestyle changes are often the first steps that are taken to manage symptoms of GERD. If diet and lifestyle changes do not help, talk with your doctor about taking medicines. Where to find more information International Foundation for Gastrointestinal Disorders: aboutgerd.org Summary When you have GERD, food and lifestyle choices are very important in easing your symptoms. Eat small meals often instead of 3 large meals a day. Eat your meals slowly and in a place where you are relaxed. Avoid bending over or lying down until 2-3 hours after eating. Limit high-fat foods such as fatty meats or fried foods. This information is not intended to replace advice given to you by your health care provider. Make sure you discuss any questions you have with your health care provider. Document Revised: 09/12/2019 Document Reviewed: 09/12/2019 Elsevier Patient Education  [place] Endoscopy, Adult Upper endoscopy is a procedure to look inside the upper GI (gastrointestinal) tract. The upper GI tract is made up of: The esophagus. This is the part of the body that moves food from your mouth to your stomach. The stomach. The duodenum. This is the first part of your small intestine. This procedure is also called esophagogastroduodenoscopy  (EGD) or gastroscopy. In this procedure, your health care provider passes a thin, flexible tube (endoscope) through your mouth and down your esophagus into your stomach and into your duodenum. A small camera is attached to the end of the tube. Images from the camera appear on a monitor in the exam room. During this procedure, your health care provider may also remove a small piece of tissue to be sent to a lab and examined under a microscope (biopsy). Your health care provider may do an upper endoscopy to diagnose cancers of the upper GI tract. You may also have this procedure to find the cause of other conditions, such as: Stomach pain. Heartburn. Pain or problems when swallowing. Nausea and vomiting. Stomach bleeding. Stomach ulcers. Tell a health care provider about: Any allergies you have. All medicines you are taking, including vitamins, herbs, eye drops, creams, and over-the-counter medicines. Any problems you or family members have had with anesthetic medicines. Any bleeding problems you have. Any surgeries you have had. Any medical conditions you have. Whether you are pregnant or may be pregnant. What are the risks? Your healthcare provider will talk with you about risks. These may include: Infection. Bleeding. Allergic reactions to medicines. A tear or hole (perforation) in the esophagus, stomach, or duodenum. What happens before the procedure? When  to stop eating and drinking Follow instructions from your health care provider about what you may eat and drink. These may include: 8 hours before your procedure Stop eating most foods. Do not eat meat, fried foods, or fatty foods. Eat only light foods, such as toast or crackers. All liquids are okay except energy drinks and alcohol. 6 hours before your procedure Stop eating. Drink only clear liquids, such as water, clear fruit juice, black coffee, plain tea, and sports drinks. Do not drink energy drinks or alcohol. 2 hours  before your procedure Stop drinking all liquids. You may be allowed to take medicines with small sips of water. If you do not follow your health care provider's instructions, your procedure may be delayed or canceled. Medicines Ask your health care provider about: Changing or stopping your regular medicines. This is especially important if you are taking diabetes medicines or blood thinners. Taking medicines such as aspirin and ibuprofen. These medicines can thin your blood. Do not take these medicines unless your health care provider tells you to take them. Taking over-the-counter medicines, vitamins, herbs, and supplements. General instructions If you will be going home right after the procedure, plan to have a responsible adult: Take you home from the hospital or clinic. You will not be allowed to drive. Care for you for the time you are told. What happens during the procedure?  An IV will be inserted into one of your veins. You may be given one or more of the following: A medicine to help you relax (sedative). A medicine to numb the throat (local anesthetic). You will lie on your left side on an exam table. Your health care provider will pass the endoscope through your mouth and down your esophagus. Your health care provider will use the scope to check the inside of your esophagus, stomach, and duodenum. Biopsies may be taken. The endoscope will be removed. The procedure may vary among health care providers and hospitals. What happens after the procedure? Your blood pressure, heart rate, breathing rate, and blood oxygen level will be monitored until you leave the hospital or clinic. When your throat is no longer numb, you may be given some fluids to drink. If you were given a sedative during the procedure, it can affect you for several hours. Do not drive or operate machinery until your health care provider says that it is safe. It is up to you to get the results of your procedure.  Ask your health care provider, or the department that is doing the procedure, when your results will be ready. Contact a health care provider if you: Have a sore throat that lasts longer than 1 day. Have a fever. Get help right away if you: Vomit blood or your vomit looks like coffee grounds. Have bloody, black, or tarry stools. Have a very bad sore throat or you cannot swallow. Have difficulty breathing or very bad pain in your chest or abdomen. These symptoms may be an emergency. Get help right away. Call 911. Do not wait to see if the symptoms will go away. Do not drive yourself to the hospital. Summary Upper endoscopy is a procedure to look inside the upper GI tract. During the procedure, an IV will be inserted into one of your veins. You may be given a medicine to help you relax. The endoscope will be passed through your mouth and down your esophagus. Follow instructions from your health care provider about what you can eat and drink. This information is not intended  to replace advice given to you by your health care provider. Make sure you discuss any questions you have with your health care provider. Document Revised: 06/12/2021 Document Reviewed: 06/12/2021 Elsevier Patient Education  [place].

## 2022-04-25 DIAGNOSIS — Z96641 Presence of right artificial hip joint: Secondary | ICD-10-CM | POA: Diagnosis not present

## 2022-05-07 ENCOUNTER — Telehealth: Payer: Self-pay | Admitting: Gastroenterology

## 2022-05-07 NOTE — Telephone Encounter (Signed)
Patient calling to schedule a colonoscopy. Requesting call back.

## 2022-05-08 ENCOUNTER — Other Ambulatory Visit: Payer: Self-pay

## 2022-05-08 ENCOUNTER — Telehealth: Payer: Self-pay

## 2022-05-08 DIAGNOSIS — Z1211 Encounter for screening for malignant neoplasm of colon: Secondary | ICD-10-CM

## 2022-05-08 DIAGNOSIS — Z86010 Personal history of colonic polyps: Secondary | ICD-10-CM

## 2022-05-08 MED ORDER — NA SULFATE-K SULFATE-MG SULF 17.5-3.13-1.6 GM/177ML PO SOLN: 1.0000 | Freq: Once | ORAL | 0 refills | Status: AC

## 2022-05-08 NOTE — Telephone Encounter (Signed)
Gastroenterology Pre-Procedure Review  Request Date: 08/15/22 Requesting Physician: Dr. Vicente Males  PATIENT REVIEW QUESTIONS: The patient responded to the following health history questions as indicated:    1. Are you having any GI issues? yes (GERD discussed during her 04/17/22 appt with Dr. Vicente Males recommended EGD w/ Colonoscopy, however patient wanted to wait to have this ) 2. Do you have a personal history of Polyps? yes (last colonoscopy was 08/09/19 performed by Dr. Vicente Males polyps were noted ) 3. Do you have a family history of Colon Cancer or Polyps? no 4. Diabetes Mellitus? no 5. Joint replacements in the past 12 months?no 6. Major health problems in the past 3 months?no 7. Any artificial heart valves, MVP, or defibrillator?no    MEDICATIONS & ALLERGIES:    Patient reports the following regarding taking any anticoagulation/antiplatelet therapy:   Plavix, Coumadin, Eliquis, Xarelto, Lovenox, Pradaxa, Brilinta, or Effient? no Aspirin? no  Patient confirms/reports the following medications:  Current Outpatient Medications  Medication Sig Dispense Refill   Cholecalciferol (VITAMIN D) 2000 units CAPS Take 1 capsule by mouth daily.     levothyroxine (SYNTHROID) 112 MCG tablet TAKE 1 TABLET EVERY DAY 90 tablet 3   lithium carbonate (LITHOBID) 300 MG CR tablet TAKE 1 TABLET TWICE DAILY 180 tablet 1   mirtazapine (REMERON) 15 MG tablet TAKE 1 TABLET (15 MG TOTAL) BY MOUTH AT BEDTIME. 90 tablet 10   montelukast (SINGULAIR) 10 MG tablet Take 10 mg by mouth at bedtime.     Multiple Vitamin (MULTIVITAMIN) tablet Take 1 tablet by mouth daily.     Omega-3 Fatty Acids (FISH OIL) 1200 MG CAPS Take 1,200 mg by mouth 2 (two) times daily.      omeprazole (PRILOSEC) 20 MG capsule Take 20 mg by mouth daily. (Patient not taking: Reported on 04/17/2022)     simvastatin (ZOCOR) 10 MG tablet TAKE 1 TABLET (10 MG TOTAL) BY MOUTH AT BEDTIME. 90 tablet 10   No current facility-administered medications for this visit.     Patient confirms/reports the following allergies:  Allergies  Allergen Reactions   Fluzone [Influenza Virus Vaccine] Hives    High dose only---hives, itchy    No orders of the defined types were placed in this encounter.   AUTHORIZATION INFORMATION Primary Insurance: 1D#: Group #:  Secondary Insurance: 1D#: Group #:  SCHEDULE INFORMATION: Date: 08/15/22 Time: Location: [place]

## 2022-05-09 DIAGNOSIS — R490 Dysphonia: Secondary | ICD-10-CM | POA: Diagnosis not present

## 2022-05-09 DIAGNOSIS — J301 Allergic rhinitis due to pollen: Secondary | ICD-10-CM | POA: Diagnosis not present

## 2022-05-09 DIAGNOSIS — J300 Vasomotor rhinitis: Secondary | ICD-10-CM | POA: Diagnosis not present

## 2022-05-09 DIAGNOSIS — K219 Gastro-esophageal reflux disease without esophagitis: Secondary | ICD-10-CM | POA: Diagnosis not present

## 2022-05-22 ENCOUNTER — Other Ambulatory Visit: Payer: Self-pay | Admitting: Family

## 2022-05-22 DIAGNOSIS — F3170 Bipolar disorder, currently in remission, most recent episode unspecified: Secondary | ICD-10-CM

## 2022-07-01 ENCOUNTER — Ambulatory Visit (INDEPENDENT_AMBULATORY_CARE_PROVIDER_SITE_OTHER): Payer: Medicare HMO | Admitting: Family

## 2022-07-01 ENCOUNTER — Encounter: Payer: Self-pay | Admitting: Family

## 2022-07-01 VITALS — BP 118/62 | HR 96 | Temp 98.0°F | Ht 67.0 in | Wt 163.2 lb

## 2022-07-01 DIAGNOSIS — Z0001 Encounter for general adult medical examination with abnormal findings: Secondary | ICD-10-CM | POA: Diagnosis not present

## 2022-07-01 DIAGNOSIS — E7800 Pure hypercholesterolemia, unspecified: Secondary | ICD-10-CM | POA: Diagnosis not present

## 2022-07-01 DIAGNOSIS — R251 Tremor, unspecified: Secondary | ICD-10-CM

## 2022-07-01 DIAGNOSIS — Z5181 Encounter for therapeutic drug level monitoring: Secondary | ICD-10-CM | POA: Diagnosis not present

## 2022-07-01 DIAGNOSIS — R0989 Other specified symptoms and signs involving the circulatory and respiratory systems: Secondary | ICD-10-CM

## 2022-07-01 LAB — LIPID PANEL
Cholesterol: 185 mg/dL (ref 0–200)
HDL: 48.8 mg/dL (ref >39.00)
NonHDL: 135.76
Total CHOL/HDL Ratio: 4
Triglycerides: 204 mg/dL — ABNORMAL HIGH (ref 0.0–149.0)
VLDL: 40.8 mg/dL — ABNORMAL HIGH (ref 0.0–40.0)

## 2022-07-01 LAB — CBC
HCT: 42.8 % (ref 36.0–46.0)
Hemoglobin: 14 g/dL (ref 12.0–15.0)
MCHC: 32.7 g/dL (ref 30.0–36.0)
MCV: 90.1 fl (ref 78.0–100.0)
Platelets: 219 10*3/uL (ref 150.0–400.0)
RBC: 4.75 Mil/uL (ref 3.87–5.11)
RDW: 13.1 % (ref 11.5–15.5)
WBC: 6.6 10*3/uL (ref 4.0–10.5)

## 2022-07-01 LAB — BASIC METABOLIC PANEL
BUN: 16 mg/dL (ref 6–23)
CO2: 30 mEq/L (ref 19–32)
Calcium: 9.5 mg/dL (ref 8.4–10.5)
Chloride: 105 mEq/L (ref 96–112)
Creatinine, Ser: 1.01 mg/dL (ref 0.40–1.20)
GFR: 54.49 mL/min — ABNORMAL LOW (ref >60.00)
Glucose, Bld: 96 mg/dL (ref 70–99)
Potassium: 4.4 mEq/L (ref 3.5–5.1)
Sodium: 141 mEq/L (ref 135–145)

## 2022-07-01 LAB — VITAMIN B12: Vitamin B-12: 1089 pg/mL — ABNORMAL HIGH (ref 211–911)

## 2022-07-01 LAB — LDL CHOLESTEROL, DIRECT: Direct LDL: 112 mg/dL

## 2022-07-01 NOTE — Patient Instructions (Signed)
A referral was placed today for neurology referral to evaluate for tremors.  Please let us know if you have not heard back within 2 weeks about the referral.   Stop by the lab prior to leaving today. I will notify you of your results once received.   Recommendations on keeping yourself healthy:  - Exercise at least 30-45 minutes a day, 3-4 days a week.  - Eat a low-fat diet with lots of fruits and vegetables, up to 7-9 servings per day.  - Seatbelts can save your life. Wear them always.  - Smoke detectors on every level of your home, check batteries every year.  - Eye Doctor - have an eye exam every 1-2 years  - Safe sex - if you may be exposed to STDs, use a condom.  - Alcohol -  If you drink, do it moderately, less than 2 drinks per day.  - Health Care Power of Attorney. Choose someone to speak for you if you are not able.  - Depression is common in our stressful world.If you're feeling down or losing interest in things you normally enjoy, please come in for a visit.  - Violence - If anyone is threatening or hurting you, please call immediately.  Due to recent changes in healthcare laws, you may see results of your imaging and/or laboratory studies on MyChart before I have had a chance to review them.  I understand that in some cases there may be results that are confusing or concerning to you. Please understand that not all results are received at the same time and often I may need to interpret multiple results in order to provide you with the best plan of care or course of treatment. Therefore, I ask that you please give me 2 business days to thoroughly review all your results before contacting my office for clarification. Should we see a critical lab result, you will be contacted sooner.   I will see you again in one year for your annual comprehensive exam unless otherwise stated and or with acute concerns.  It was a pleasure seeing you today! Please do not hesitate to reach out with any  questions and or concerns.  Regards,   Mort Sawyers

## 2022-07-01 NOTE — Assessment & Plan Note (Signed)
Ongoing, still seeing Dr. Sheran Spine Ent.  Pending EGD to r/o other etiologies.

## 2022-07-01 NOTE — Progress Notes (Signed)
Subjective:  Patient ID: Felicia Ross, female    DOB: [birth year]  Age: 77 y.o. MRN: [id number]  Patient Care Team: Mort Sawyers, FNP as PCP - General (Family Medicine)   CC:  Chief Complaint  Patient presents with   Annual Exam    HPI Felicia Ross is a 77 y.o. female who presents today for an annual physical exam. She reports consuming a general diet. The patient has a physically strenuous job, but has no regular exercise apart from work.  She generally feels well. She reports sleeping fairly well. She does have additional problems to discuss today.   Vision:Not within last year Dental:Receives regular dental care STD:The patient denies history of sexually transmitted disease.  Lung Cancer Screening with low-dose Chest CT: n/a - Adults age 33-80 who are current cigarette smokers or quit within the last 15 years. Must have 20 pack year history.  AAA Screening: n/a - Men age 69-75 who have ever smoked  Mammogram: schedule 09/16/2022 Last pap: > 16 y/o  Colonoscopy: scheduled for May 31st 2024 along with EGD  Bone density scan: 09/27/2020, pt would like to skip this year will get in 2025.   Pt is with acute concerns.  Bil hand tremors: at first she thought it was because of a thyroid problem but Dr. Gershon Crane told her it would not be thyroid related and advised her to f/u with PCP. She states at first tremor was only her right hand but has since progressed to both hands. She states the tremor started about one year ago. She does find the tremor seems to be worsening, she finds it is hard for her to eat certain foods such as salad because it won't stay on her fork so she avoids certain activities.   HLD: tolerating simvastatin well at 10 mg nightly.   Chronic cough: still having problem with throat clearing, has since seen Ent. Has since recommended an upper EGD when she is has her colonoscopy which is scheduled for May 31st. This will be with Dr. Tobi Bastos.   Advanced  Directives Patient does have advanced directives including living will and healthcare power of attorney. She does not have a copy in the electronic medical record.   DEPRESSION SCREENING    07/01/2022    9:18 AM 07/18/2021   12:42 PM 06/18/2020    8:45 AM 03/06/2020    8:50 AM 06/16/2019    8:30 AM 05/11/2018    9:14 AM 05/07/2017    9:25 AM  PHQ 2/9 Scores  PHQ - 2 Score 0 0 0 0 0 0 0  PHQ- 9 Score   1   0      ROS: Negative unless specifically indicated above in HPI.    Current Outpatient Medications:    Cholecalciferol (VITAMIN D) 2000 units CAPS, Take 1 capsule by mouth daily., Disp: , Rfl:    ipratropium (ATROVENT) 0.03 % nasal spray, Place into both nostrils., Disp: , Rfl:    levothyroxine (SYNTHROID) 112 MCG tablet, TAKE 1 TABLET EVERY DAY, Disp: 90 tablet, Rfl: 3   lithium carbonate (LITHOBID) 300 MG ER tablet, TAKE 1 TABLET TWICE DAILY, Disp: 180 tablet, Rfl: 3   mirtazapine (REMERON) 15 MG tablet, TAKE 1 TABLET (15 MG TOTAL) BY MOUTH AT BEDTIME., Disp: 90 tablet, Rfl: 10   montelukast (SINGULAIR) 10 MG tablet, Take 10 mg by mouth at bedtime., Disp: , Rfl:    Multiple Vitamin (MULTIVITAMIN) tablet, Take 1 tablet by mouth daily., Disp: , Rfl:  Omega-3 Fatty Acids (FISH OIL) 1200 MG CAPS, Take 1,200 mg by mouth 2 (two) times daily. , Disp: , Rfl:    simvastatin (ZOCOR) 10 MG tablet, TAKE 1 TABLET (10 MG TOTAL) BY MOUTH AT BEDTIME., Disp: 90 tablet, Rfl: 10    Objective:    BP 118/62 (BP Location: Left Arm)   Pulse 96   Temp 98 F (36.7 C) (Temporal)   Ht $Remove .702 m)   Wt 163 lb 3.2 oz (74 kg)   SpO2 99%   BMI 25.56 kg/m   BP Readings from Last 3 Encounters:  07/01/22 118/62  04/17/22 132/80  02/14/22 130/66      Physical Exam Constitutional:      General: She is not in acute distress.    Appearance: Normal appearance. She is normal weight. She is not ill-appearing.  HENT:     Head: Normocephalic.     Right Ear: Tympanic membrane normal.     Left Ear:  Tympanic membrane normal.     Nose: Nose normal.     Mouth/Throat:     Mouth: Mucous membranes are moist.  Eyes:     Extraocular Movements: Extraocular movements intact.     Pupils: Pupils are equal, round, and reactive to light.  Cardiovascular:     Rate and Rhythm: Normal rate and regular rhythm.  Pulmonary:     Effort: Pulmonary effort is normal.     Breath sounds: Normal breath sounds.  Abdominal:     General: Abdomen is flat. Bowel sounds are normal.     Palpations: Abdomen is soft.     Tenderness: There is no guarding or rebound.  Musculoskeletal:        General: Normal range of motion.     Cervical back: Normal range of motion.  Skin:    General: Skin is warm.     Capillary Refill: Capillary refill takes less than 2 seconds.  Neurological:     General: No focal deficit present.     Mental Status: She is alert.  Psychiatric:        Mood and Affect: Mood normal.        Behavior: Behavior normal.        Thought Content: Thought content normal.        Judgment: Judgment normal.          Assessment & Plan:  Chronic throat clearing Assessment & Plan: Ongoing, still seeing Dr. Sheran [facility] Ent.  Pending EGD to r/o other etiologies.    Tremor of both hands -     Ambulatory referral to Neurology -     Vitamin B12  Hypercholesteremia -     Lipid panel  Encounter for therapeutic drug monitoring -     Lithium level  Encounter for general adult medical examination with abnormal findings Assessment & Plan: Patient Counseling(The following topics were reviewed):  Preventative care handout given to pt  Health maintenance and immunizations reviewed. Please refer to Health maintenance section. Pt advised on safe sex, wearing seatbelts in car, and proper nutrition labwork ordered today for annual Dental health: Discussed importance of regular tooth brushing, flossing, and dental visits.   Orders: -     Lipid panel -     Basic metabolic panel -     CBC  Other  orders -     LDL cholesterol, direct      Follow-up: Return in about 1 year (around 07/01/2023) for f/u CPE.   Mort Sawyers, FNP

## 2022-07-02 LAB — LITHIUM LEVEL: Lithium Lvl: 0.4 mmol/L — ABNORMAL LOW (ref 0.6–1.2)

## 2022-07-02 NOTE — Progress Notes (Signed)
Lithium level stable.  B12 on higher end, confirm dose of b12 have pt take every other day. Kidney function stable. Continue to drink water daily at goal 6 glasses daily. Cholesterol mildly elevated work on low chol diet.

## 2022-07-02 NOTE — Assessment & Plan Note (Signed)
Patient Counseling(The following topics were reviewed): ? Preventative care handout given to pt  ?Health maintenance and immunizations reviewed. Please refer to Health maintenance section. ?Pt advised on safe sex, wearing seatbelts in car, and proper nutrition ?labwork ordered today for annual ?Dental health: Discussed importance of regular tooth brushing, flossing, and dental visits. ? ? ?

## 2022-07-16 DIAGNOSIS — Z8659 Personal history of other mental and behavioral disorders: Secondary | ICD-10-CM | POA: Diagnosis not present

## 2022-07-16 DIAGNOSIS — F32A Depression, unspecified: Secondary | ICD-10-CM | POA: Diagnosis not present

## 2022-07-16 DIAGNOSIS — G479 Sleep disorder, unspecified: Secondary | ICD-10-CM | POA: Diagnosis not present

## 2022-07-16 DIAGNOSIS — G251 Drug-induced tremor: Secondary | ICD-10-CM | POA: Diagnosis not present

## 2022-07-24 ENCOUNTER — Other Ambulatory Visit: Payer: Self-pay | Admitting: Family

## 2022-07-24 DIAGNOSIS — J302 Other seasonal allergic rhinitis: Secondary | ICD-10-CM

## 2022-08-07 ENCOUNTER — Telehealth: Payer: Self-pay

## 2022-08-07 NOTE — Telephone Encounter (Signed)
Patient called stating that she also wanted an EGD when having her colonoscopy done on 08/15/2022. I then called the endoscopy unit and spoke to [facility] and asked to add an EGD for the patient. She stated that she would add it. Patient was then notified that it was done.

## 2022-08-08 ENCOUNTER — Encounter: Payer: Self-pay | Admitting: Gastroenterology

## 2022-08-14 ENCOUNTER — Telehealth: Payer: Self-pay | Admitting: Gastroenterology

## 2022-08-14 ENCOUNTER — Encounter: Payer: Self-pay | Admitting: Gastroenterology

## 2022-08-14 NOTE — Telephone Encounter (Signed)
Pt left message has a question about her colonoscopy please return call

## 2022-08-14 NOTE — Telephone Encounter (Signed)
Called patient back and she had questions about tomorrow's procedures. I was able to answer them and patient did not have further questions.

## 2022-08-15 ENCOUNTER — Ambulatory Visit: Payer: Medicare HMO | Admitting: Certified Registered"

## 2022-08-15 ENCOUNTER — Encounter
Admission: RE | Disposition: A | Discharge status: Home / Self Care | Payer: Self-pay | Source: Home / Self Care | Attending: Gastroenterology

## 2022-08-15 ENCOUNTER — Encounter: Payer: Self-pay | Admitting: Gastroenterology

## 2022-08-15 ENCOUNTER — Ambulatory Visit
Admission: RE | Admit: 2022-08-15 | Discharge: 2022-08-15 | Disposition: A | Discharge status: Home / Self Care | Payer: Medicare HMO | Attending: Gastroenterology | Admitting: Gastroenterology

## 2022-08-15 ENCOUNTER — Other Ambulatory Visit: Payer: Self-pay

## 2022-08-15 DIAGNOSIS — Q438 Other specified congenital malformations of intestine: Secondary | ICD-10-CM | POA: Diagnosis not present

## 2022-08-15 DIAGNOSIS — F32A Depression, unspecified: Secondary | ICD-10-CM | POA: Diagnosis not present

## 2022-08-15 DIAGNOSIS — Z1211 Encounter for screening for malignant neoplasm of colon: Secondary | ICD-10-CM | POA: Insufficient documentation

## 2022-08-15 DIAGNOSIS — K635 Polyp of colon: Secondary | ICD-10-CM | POA: Diagnosis not present

## 2022-08-15 DIAGNOSIS — E039 Hypothyroidism, unspecified: Secondary | ICD-10-CM | POA: Diagnosis not present

## 2022-08-15 DIAGNOSIS — Z860100 Personal history of colon polyps, unspecified: Secondary | ICD-10-CM

## 2022-08-15 DIAGNOSIS — D122 Benign neoplasm of ascending colon: Secondary | ICD-10-CM | POA: Diagnosis not present

## 2022-08-15 DIAGNOSIS — K219 Gastro-esophageal reflux disease without esophagitis: Secondary | ICD-10-CM

## 2022-08-15 DIAGNOSIS — D126 Benign neoplasm of colon, unspecified: Secondary | ICD-10-CM | POA: Diagnosis not present

## 2022-08-15 DIAGNOSIS — F319 Bipolar disorder, unspecified: Secondary | ICD-10-CM | POA: Diagnosis not present

## 2022-08-15 HISTORY — PX: ESOPHAGOGASTRODUODENOSCOPY (EGD) WITH PROPOFOL: SHX5813

## 2022-08-15 HISTORY — PX: COLONOSCOPY WITH PROPOFOL: SHX5780

## 2022-08-15 SURGERY — COLONOSCOPY WITH PROPOFOL
Anesthesia: General

## 2022-08-15 MED ORDER — PROPOFOL 1000 MG/100ML IV EMUL
INTRAVENOUS | Status: AC
  Filled 2022-08-15: qty 100

## 2022-08-15 MED ORDER — SODIUM CHLORIDE 0.9 % IV SOLN: INTRAVENOUS | Status: DC | PRN

## 2022-08-15 MED ORDER — GLYCOPYRROLATE 0.2 MG/ML IJ SOLN
INTRAMUSCULAR | Status: DC | PRN
  Administered 2022-08-15: .2 mg via INTRAVENOUS

## 2022-08-15 MED ORDER — SODIUM CHLORIDE 0.9 % IV SOLN: INTRAVENOUS | Status: DC

## 2022-08-15 MED ORDER — PROPOFOL 10 MG/ML IV BOLUS
INTRAVENOUS | Status: AC
  Filled 2022-08-15: qty 20

## 2022-08-15 MED ORDER — PROPOFOL 500 MG/50ML IV EMUL
INTRAVENOUS | Status: DC | PRN
  Administered 2022-08-15: 150 ug/kg/min via INTRAVENOUS
  Administered 2022-08-15: 30 mg via INTRAVENOUS
  Administered 2022-08-15: 70 mg via INTRAVENOUS

## 2022-08-15 MED ORDER — ONDANSETRON HCL 4 MG/2ML IJ SOLN
INTRAMUSCULAR | Status: DC | PRN
  Administered 2022-08-15: 4 mg via INTRAVENOUS

## 2022-08-15 NOTE — Anesthesia Preprocedure Evaluation (Signed)
Anesthesia Evaluation  Patient identified by MRN, date of birth, ID band Patient awake    Reviewed: Allergy & Precautions, NPO status , Patient's Chart, lab work & pertinent test results  Airway Mallampati: II  TM Distance: >3 FB Neck ROM: full    Dental  (+) Teeth Intact   Pulmonary neg pulmonary ROS   Pulmonary exam normal breath sounds clear to auscultation       Cardiovascular Exercise Tolerance: Good negative cardio ROS Normal cardiovascular exam Rhythm:Regular Rate:Normal     Neuro/Psych    Depression    negative neurological ROS  negative psych ROS   GI/Hepatic negative GI ROS, Neg liver ROS,,,  Endo/Other  negative endocrine ROSHypothyroidism    Renal/GU negative Renal ROS  negative genitourinary   Musculoskeletal   Abdominal Normal abdominal exam  (+)   Peds negative pediatric ROS (+)  Hematology negative hematology ROS (+) Blood dyscrasia, anemia   Anesthesia Other Findings Past Medical History: No date: Arthritis No date: Bipolar disorder (HCC) No date: Depression No date: Hypercholesteremia No date: Hypothyroidism No date: Osteopenia No date: Thyroid goiter  Past Surgical History: 08/09/2019: COLONOSCOPY WITH PROPOFOL; N/A     Comment:  Procedure: COLONOSCOPY WITH PROPOFOL;  Surgeon: Wyline Mood, MD;  Location[facility]COPY;  Service:               Gastroenterology;  Laterality: N/A; No date: JOINT REPLACEMENT 03/17/2008: THYROID SURGERY     Comment:  complete 03/28/2020: TOTAL HIP ARTHROPLASTY; Right     Comment:  Procedure: TOTAL HIP ARTHROPLASTY ANTERIOR APPROACH;                Surgeon: Ollen Gross, MD;  Location: [facility] ORS;  Service:              Orthopedics;  Laterality: Right;  No date: TUBAL LIGATION  BMI    Body Mass Index: 24.65 kg/m      Reproductive/Obstetrics negative OB ROS                             Anesthesia  Physical Anesthesia Plan  ASA: 2  Anesthesia Plan: General   Post-op Pain Management:    Induction: Intravenous  PONV Risk Score and Plan: Propofol infusion and TIVA  Airway Management Planned: Natural Airway  Additional Equipment:   Intra-op Plan:   Post-operative Plan:   Informed Consent: I have reviewed the patients History and Physical, chart, labs and discussed the procedure including the risks, benefits and alternatives for the proposed anesthesia with the patient or authorized representative who has indicated his/her understanding and acceptance.     Dental Advisory Given  Plan Discussed with: CRNA and Surgeon  Anesthesia Plan Comments:        Anesthesia Quick Evaluation

## 2022-08-15 NOTE — Transfer of Care (Signed)
Immediate Anesthesia Transfer of Care Note  Patient: Felicia Ross  Procedure(s) Performed: COLONOSCOPY WITH PROPOFOL ESOPHAGOGASTRODUODENOSCOPY (EGD) WITH PROPOFOL  Patient Location: PACU  Anesthesia Type:MAC  Level of Consciousness: drowsy  Airway & Oxygen Therapy: Patient Spontanous Breathing and Patient connected to face mask oxygen  Post-op Assessment: Report given to RN and Post -op Vital signs reviewed and stable  Post vital signs: Reviewed  Last Vitals:  Vitals Value Taken Time  BP 132/63 08/15/22 0824  Temp 64F   Pulse 65 08/15/22 0824  Resp 15 08/15/22 0824  SpO2 100 % 08/15/22 0824  Vitals shown include unvalidated device data.  Last Pain:  Vitals:   08/15/22 0709  TempSrc: Temporal  PainSc: 0-No pain         Complications: No notable events documented.

## 2022-08-15 NOTE — Anesthesia Postprocedure Evaluation (Signed)
Anesthesia Post Note  Patient: Felicia Ross  Procedure(s) Performed: COLONOSCOPY WITH PROPOFOL ESOPHAGOGASTRODUODENOSCOPY (EGD) WITH PROPOFOL  Patient location during evaluation: PACU Anesthesia Type: General Level of consciousness: awake Pain management: pain level controlled Vital Signs Assessment: post-procedure vital signs reviewed and stable Respiratory status: spontaneous breathing and nonlabored ventilation Cardiovascular status: stable Anesthetic complications: no   No notable events documented.   Last Vitals:  Vitals:   08/15/22 0709 08/15/22 0824  BP: (!) 141/72 132/63  Pulse: (!) 59 67  Resp:  10  Temp: 36.6 C 36.4 C  SpO2: 99% 100%    Last Pain:  Vitals:   08/15/22 0844  TempSrc:   PainSc: 0-No pain                 VAN STAVEREN,Anitria Andon

## 2022-08-15 NOTE — Op Note (Signed)
[facility] Gastroenterology Patient Name: Felicia Ross Procedure Date: 08/15/2022 7:07 AM MRN: [id number] Account #: [id number] Date of Birth: November 10, 1945 Admit Type: Outpatient Age: 77 Room: [facility] ENDO ROOM 4 Gender: Female Note Status: Finalized Instrument Name: Upper Endoscope [id number] Procedure:             Upper GI endoscopy Indications:           Follow-up of gastro-esophageal reflux disease Providers:             Wyline Mood MD, MD Referring MD:          Mort Sawyers (Referring MD) Medicines:             Monitored Anesthesia Care Complications:         No immediate complications. Procedure:             Pre-Anesthesia Assessment:                        - Prior to the procedure, a History and Physical was                         performed, and patient medications, allergies and                         sensitivities were reviewed. The patient's tolerance                         of previous anesthesia was reviewed.                        - ASA Grade Assessment: II - A patient with mild                         systemic disease.                        After obtaining informed consent, the endoscope was                         passed under direct vision. Throughout the procedure,                         the patient's blood pressure, pulse, and oxygen                         saturations were monitored continuously. The Endoscope                         was introduced through the mouth, and advanced to the                         third part of duodenum. The upper GI endoscopy was                         accomplished with ease. The patient tolerated the                         procedure well. Findings:      The esophagus was normal.      The stomach was normal.  The examined duodenum was normal. Impression:            - Normal esophagus.                        - Normal stomach.                        - Normal examined duodenum.                        - No  specimens collected. Recommendation:        - Perform a colonoscopy today. Procedure Code(s):     --- Professional ---                        [phone], Esophagogastroduodenoscopy, flexible,                         transoral; diagnostic, including collection of                         specimen(s) by brushing or washing, when performed                         (separate procedure) Diagnosis Code(s):     --- Professional ---                        K21.9, Gastro-esophageal reflux disease without                         esophagitis CPT copyright 2022 American Medical Association. All rights reserved. The codes documented in this report are preliminary and upon coder review may  be revised to meet current compliance requirements. Wyline Mood, MD Wyline Mood MD, MD 08/15/2022 7:46:58 AM This report has been signed electronically. Number of Addenda: 0 Note Initiated On: 08/15/2022 7:07 AM Estimated Blood Loss:  Estimated blood loss: none.      [facility]

## 2022-08-15 NOTE — Op Note (Signed)
[facility] Gastroenterology Patient Name: Felicia Ross Procedure Date: 08/15/2022 6:58 AM MRN: [id number] Account #: [id number] Date of Birth: 1945/08/18 Admit Type: Outpatient Age: 78 Room: [facility] ENDO ROOM 4 Gender: Female Note Status: Finalized Instrument Name: Prentice Docker [id number] Procedure:             Colonoscopy Indications:           Screening for colorectal malignant neoplasm Providers:             Wyline Mood MD, MD Referring MD:          Mort Sawyers (Referring MD) Medicines:             Monitored Anesthesia Care Complications:         No immediate complications. Procedure:             Pre-Anesthesia Assessment:                        - Prior to the procedure, a History and Physical was                         performed, and patient medications, allergies and                         sensitivities were reviewed. The patient's tolerance                         of previous anesthesia was reviewed.                        - The risks and benefits of the procedure and the                         sedation options and risks were discussed with the                         patient. All questions were answered and informed                         consent was obtained.                        - ASA Grade Assessment: II - A patient with mild                         systemic disease.                        After obtaining informed consent, the colonoscope was                         passed under direct vision. Throughout the procedure,                         the patient's blood pressure, pulse, and oxygen                         saturations were monitored continuously. The                         Colonoscope was introduced through  the anus and                         advanced to the the cecum, identified by the                         appendiceal orifice. The patient tolerated the                         procedure well. The quality of the bowel preparation                          was good. The ileocecal valve, appendiceal orifice,                         and rectum were photographed. The colonoscopy was                         somewhat difficult due to significant looping.                         Successful completion of the procedure was aided by                         withdrawing the scope and replacing with the pediatric                         colonoscope. Findings:      A 12 mm polyp was found in the ascending colon. The polyp was sessile.       The polyp was removed with a cold snare. Resection and retrieval were       complete. To prevent bleeding after the polypectomy, three hemostatic       clips were successfully placed. Clip manufacturer: [employer].       There was no bleeding at the end of the procedure.      The exam was otherwise without abnormality on direct and retroflexion       views. Impression:            - One 12 mm polyp in the ascending colon, removed with                         a cold snare. Resected and retrieved. Clips were                         placed. Clip manufacturer: [employer].                        - The examination was otherwise normal on direct and                         retroflexion views. Recommendation:        - Discharge patient to home (with escort).                        - Resume previous diet.                        - Continue present medications.                        -  Await pathology results.                        - Repeat colonoscopy is not recommended due to current                         age (76 years or older) for surveillance. Procedure Code(s):     --- Professional ---                        [id number], Colonoscopy, flexible; with removal of                         tumor(s), polyp(s), or other lesion(s) by snare                         technique Diagnosis Code(s):     --- Professional ---                        Z12.11, Encounter for screening for malignant neoplasm                          of colon                        D12.2, Benign neoplasm of ascending colon CPT copyright 2022 American Medical Association. All rights reserved. The codes documented in this report are preliminary and upon coder review may  be revised to meet current compliance requirements. Wyline Mood, MD Wyline Mood MD, MD 08/15/2022 8:22:08 AM This report has been signed electronically. Number of Addenda: 0 Note Initiated On: 08/15/2022 6:58 AM Scope Withdrawal Time: 0 hours 9 minutes 52 seconds  Total Procedure Duration: 0 hours 31 minutes 24 seconds  Estimated Blood Loss:  Estimated blood loss: none.      [facility]

## 2022-08-15 NOTE — H&P (Signed)
Wyline Mood, MD [address], [place], [place], [address] [address], [place], [place], [address] Phone: [phone]  Fax: [phone]  Primary Care Physician:  Mort Sawyers, FNP   Pre-Procedure History & Physical: HPI:  Felicia Ross is a 77 y.o. 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 Authorizing Provider  Cholecalciferol (VITAMIN D) 2000 units CAPS Take 1 capsule by mouth daily.   Yes [provider]  ipratropium (ATROVENT) 0.03 % nasal spray Place into both nostrils. 05/22/22  Yes [provider]  levothyroxine (SYNTHROID) 112 MCG tablet TAKE 1 TABLET EVERY DAY 01/01/22  Yes Dugal, Tabitha, FNP  lithium carbonate (LITHOBID) 300 MG ER tablet TAKE 1 TABLET TWICE DAILY 05/22/22  Yes Dugal, Tabitha, FNP  mirtazapine (REMERON) 15 MG tablet TAKE 1 TABLET (15 MG TOTAL) BY MOUTH AT BEDTIME. 01/08/22  Yes Dugal, Tabitha, FNP  montelukast (SINGULAIR) 10 MG tablet Take 10 mg by mouth at bedtime. 02/14/22  Yes [provider]  Multiple Vitamin (MULTIVITAMIN) tablet Take 1 tablet by mouth daily.   Yes [provider]  Omega-3 Fatty Acids (FISH OIL) 1200 MG CAPS  Take 1,200 mg by mouth 2 (two) times daily.    Yes [provider]  simvastatin (ZOCOR) 10 MG tablet TAKE 1 TABLET (10 MG TOTAL) BY MOUTH AT BEDTIME. 01/08/22  Yes Dugal, Tabitha, FNP  fluticasone (FLONASE) 50 MCG/ACT nasal spray PLACE 2 SPRAYS INTO BOTH NOSTRILS DAILY. 07/25/22   Mort Sawyers, FNP    Allergies as of 05/08/2022 - Review Complete 04/17/2022  Allergen Reaction Noted   Fluzone [influenza virus vaccine] Hives 01/29/2016    Family History  Problem Relation Age of Onset   Ovarian cancer Mother    Hypertension Mother    Heart attack Father    Hypertension Brother    Breast cancer Maternal Aunt    Lung cancer Paternal Aunt    Breast cancer Paternal Aunt    Hypertension Other    Breast cancer Cousin     Social History   Socioeconomic History   Marital status: Married    Spouse name: Lewis    Number of children: 3   Years of education: College   Highest education level: Not on file  Occupational History    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    Social Determinants of Health   Financial Resource Strain: Low Risk  (07/18/2021)   Overall Financial Resource Strain (CARDIA)    Difficulty of Paying Living Expenses: Not hard at all  Food Insecurity: No Food Insecurity (07/18/2021)   Hunger Vital Sign    Worried About Running Out of Food in the Last Year: Never true    Ran Out of Food in the Last Year: Never true  Transportation Needs: No Transportation Needs (07/18/2021)   PRAPARE - Administrator, Civil Service (Medical): No    Lack of  Transportation (Non-Medical): No  Physical Activity: Insufficiently Active (07/18/2021)   Exercise Vital Sign    Days of Exercise per Week: 3 days    Minutes of Exercise per Session: 20 min  Stress: No Stress Concern Present (07/18/2021)   [employer] of Occupational Health - Occupational Stress Questionnaire    Feeling of Stress : Not at all  Social Connections: Socially Integrated (07/18/2021)   Social Connection and Isolation Panel [NHANES]    Frequency of Communication with Friends and Family: More than three times a week    Frequency of Social Gatherings with Friends and Family: Three times a week    Attends Religious Services: More than 4 times per year    Active Member of Clubs or Organizations: Yes    Attends Banker Meetings: More than 4 times per year    Marital Status: Married  Catering manager Violence: Not At Risk (07/18/2021)   Humiliation, Afraid, Rape, and Kick questionnaire    Fear of Current or Ex-Partner: No    Emotionally Abused: No    Physically Abused: No    Sexually Abused: No    Review of Systems: See HPI, otherwise negative ROS  Physical Exam: BP (!) 141/72   Pulse (!) 59   Temp 97.9 F (36.6 C) (Temporal)   Ht 5\' 7"  (1.702 m)   Wt 71.4 kg   SpO2 99%   BMI 24.65 kg/m  General:   Alert,  pleasant and cooperative in NAD Head:  Normocephalic and atraumatic. Neck:  Supple; no masses or thyromegaly. Lungs:  Clear throughout to auscultation, normal respiratory effort.    Heart:  +S1, +S2, Regular rate and rhythm, No edema. Abdomen:  Soft, nontender and nondistended. Normal bowel sounds, without guarding, and without rebound.   Neurologic:  Alert and  oriented x4;  grossly normal neurologically.  Impression/Plan: Felicia Ross is here for an endoscopy and colonoscopy  to be performed for  evaluation of GERD and colon cancer screening     Risks, benefits, limitations, and alternatives regarding endoscopy have been reviewed with the  patient.  Questions have been answered.  All parties agreeable.   Wyline Mood, MD  08/15/2022, 7:37 AM

## 2022-08-18 ENCOUNTER — Telehealth: Payer: Self-pay

## 2022-08-18 ENCOUNTER — Encounter: Payer: Self-pay | Admitting: Gastroenterology

## 2022-08-18 NOTE — Telephone Encounter (Signed)
Pt called to report that she has been experiencing a numbness feeling in her upper lip, denies swelling or any other Sx... But states that it has not worsened or improved since Friday  Please advise

## 2022-08-19 LAB — SURGICAL PATHOLOGY

## 2022-08-19 NOTE — Telephone Encounter (Signed)
Called patient and asked how she was feeling and patient stated that she woke up feeling great and with no issues with her lips. She stated that she was not sure why she was feeling as such but glad to know that it has gotten away. I let patient know to give [place] a call in case she had further concerns. Patient agreed and had no further questions or concerns.

## 2022-08-20 ENCOUNTER — Encounter: Payer: Self-pay | Admitting: Gastroenterology

## 2022-09-01 DIAGNOSIS — G479 Sleep disorder, unspecified: Secondary | ICD-10-CM | POA: Diagnosis not present

## 2022-09-01 DIAGNOSIS — Z8659 Personal history of other mental and behavioral disorders: Secondary | ICD-10-CM | POA: Diagnosis not present

## 2022-09-01 DIAGNOSIS — G251 Drug-induced tremor: Secondary | ICD-10-CM | POA: Diagnosis not present

## 2022-09-02 DIAGNOSIS — J300 Vasomotor rhinitis: Secondary | ICD-10-CM | POA: Diagnosis not present

## 2022-09-02 DIAGNOSIS — K219 Gastro-esophageal reflux disease without esophagitis: Secondary | ICD-10-CM | POA: Diagnosis not present

## 2022-09-02 DIAGNOSIS — R490 Dysphonia: Secondary | ICD-10-CM | POA: Diagnosis not present

## 2022-09-16 ENCOUNTER — Ambulatory Visit
Admission: RE | Admit: 2022-09-16 | Discharge: 2022-09-16 | Disposition: A | Discharge status: Home / Self Care | Payer: Medicare HMO | Source: Ambulatory Visit | Attending: Family | Admitting: Family

## 2022-09-16 DIAGNOSIS — Z1231 Encounter for screening mammogram for malignant neoplasm of breast: Secondary | ICD-10-CM | POA: Insufficient documentation

## 2022-09-17 ENCOUNTER — Other Ambulatory Visit: Payer: Self-pay | Admitting: Family

## 2022-09-17 DIAGNOSIS — N6489 Other specified disorders of breast: Secondary | ICD-10-CM

## 2022-09-17 DIAGNOSIS — R928 Other abnormal and inconclusive findings on diagnostic imaging of breast: Secondary | ICD-10-CM

## 2022-09-24 ENCOUNTER — Ambulatory Visit
Admission: RE | Admit: 2022-09-24 | Discharge: 2022-09-24 | Disposition: A | Discharge status: Home / Self Care | Payer: Medicare HMO | Source: Ambulatory Visit | Attending: Family | Admitting: Family

## 2022-09-24 DIAGNOSIS — R928 Other abnormal and inconclusive findings on diagnostic imaging of breast: Secondary | ICD-10-CM | POA: Insufficient documentation

## 2022-09-24 DIAGNOSIS — N6489 Other specified disorders of breast: Secondary | ICD-10-CM

## 2022-09-24 DIAGNOSIS — R921 Mammographic calcification found on diagnostic imaging of breast: Secondary | ICD-10-CM | POA: Diagnosis not present

## 2022-09-24 DIAGNOSIS — R92333 Mammographic heterogeneous density, bilateral breasts: Secondary | ICD-10-CM | POA: Diagnosis not present

## 2022-09-25 ENCOUNTER — Other Ambulatory Visit: Payer: Self-pay | Admitting: Family

## 2022-09-25 DIAGNOSIS — N6489 Other specified disorders of breast: Secondary | ICD-10-CM

## 2022-09-25 DIAGNOSIS — R928 Other abnormal and inconclusive findings on diagnostic imaging of breast: Secondary | ICD-10-CM

## 2022-09-30 ENCOUNTER — Ambulatory Visit: Admission: RE | Admit: 2022-09-30 | Payer: Medicare HMO | Source: Ambulatory Visit

## 2022-09-30 ENCOUNTER — Ambulatory Visit
Admission: RE | Admit: 2022-09-30 | Discharge: 2022-09-30 | Disposition: A | Discharge status: Home / Self Care | Payer: Medicare HMO | Source: Ambulatory Visit | Attending: Family | Admitting: Family

## 2022-09-30 DIAGNOSIS — N6489 Other specified disorders of breast: Secondary | ICD-10-CM | POA: Insufficient documentation

## 2022-09-30 DIAGNOSIS — R920 Mammographic microcalcification found on diagnostic imaging of breast: Secondary | ICD-10-CM | POA: Diagnosis not present

## 2022-09-30 DIAGNOSIS — R928 Other abnormal and inconclusive findings on diagnostic imaging of breast: Secondary | ICD-10-CM

## 2022-09-30 DIAGNOSIS — D242 Benign neoplasm of left breast: Secondary | ICD-10-CM | POA: Diagnosis not present

## 2022-09-30 DIAGNOSIS — N6012 Diffuse cystic mastopathy of left breast: Secondary | ICD-10-CM | POA: Diagnosis not present

## 2022-09-30 HISTORY — PX: BREAST BIOPSY: SHX20

## 2022-09-30 MED ORDER — LIDOCAINE 1 % OPTIME INJ - NO CHARGE
5.0000 mL | Freq: Once | INTRAMUSCULAR | Status: AC
  Administered 2022-09-30: 5 mL
  Filled 2022-09-30: qty 6

## 2022-09-30 MED ORDER — LIDOCAINE-EPINEPHRINE 1 %-1:100000 IJ SOLN
20.0000 mL | Freq: Once | INTRAMUSCULAR | Status: AC
  Administered 2022-09-30: 20 mL
  Filled 2022-09-30: qty 20

## 2022-09-30 NOTE — Progress Notes (Signed)
noted 

## 2022-10-01 NOTE — Progress Notes (Signed)
noted 

## 2022-10-02 NOTE — Progress Notes (Signed)
noted 

## 2022-10-28 ENCOUNTER — Ambulatory Visit (INDEPENDENT_AMBULATORY_CARE_PROVIDER_SITE_OTHER): Payer: Medicare HMO

## 2022-10-28 VITALS — Ht 67.0 in | Wt 161.0 lb

## 2022-10-28 DIAGNOSIS — Z0000 Encounter for general adult medical examination without abnormal findings: Secondary | ICD-10-CM | POA: Diagnosis not present

## 2022-10-28 NOTE — Patient Instructions (Signed)
Felicia Ross , Thank you for taking time to come for your Medicare Wellness Visit. I appreciate your ongoing commitment to your health goals. Please review the following plan we discussed and let me know if I can assist you in the future.   Referrals/Orders/Follow-Ups/Clinician Recommendations: Aim for 30 minutes of exercise or brisk walking, 6-8 glasses of water, and 5 servings of fruits and vegetables each day.   This is a list of the screening recommended for you and due dates:  Health Maintenance  Topic Date Due   Medicare Annual Wellness Visit  07/19/2022   DEXA scan (bone density measurement)  09/28/2022   Flu Shot  10/16/2022   COVID-19 Vaccine (4 - 2023-24 season) 07/01/2023*   Colon Cancer Screening  08/14/2025   DTaP/Tdap/Td vaccine (3 - Td or Tdap) 07/16/2031   Pneumonia Vaccine  Completed   Hepatitis C Screening  Completed   Zoster (Shingles) Vaccine  Completed   HPV Vaccine  Aged Out  *Topic was postponed. The date shown is not the original due date.    Advanced directives: (Copy Requested) Please bring a copy of your health care power of attorney and living will to the office to be added to your chart at your convenience.  Next Medicare Annual Wellness Visit scheduled for next year: Yes  Preventive Care 76 Years and Older, Female Preventive care refers to lifestyle choices and visits with your health care provider that can promote health and wellness. What does preventive care include? A yearly physical exam. This is also called an annual well check. Dental exams once or twice a year. Routine eye exams. Ask your health care provider how often you should have your eyes checked. Personal lifestyle choices, including: Daily care of your teeth and gums. Regular physical activity. Eating a healthy diet. Avoiding tobacco and drug use. Limiting alcohol use. Practicing safe sex. Taking low-dose aspirin every day. Taking vitamin and mineral supplements as recommended by  your health care provider. What happens during an annual well check? The services and screenings done by your health care provider during your annual well check will depend on your age, overall health, lifestyle risk factors, and family history of disease. Counseling  Your health care provider may ask you questions about your: Alcohol use. Tobacco use. Drug use. Emotional well-being. Home and relationship well-being. Sexual activity. Eating habits. History of falls. Memory and ability to understand (cognition). Work and work Astronomer. Reproductive health. Screening  You may have the following tests or measurements: Height, weight, and BMI. Blood pressure. Lipid and cholesterol levels. These may be checked every 5 years, or more frequently if you are over 37 years old. Skin check. Lung cancer screening. You may have this screening every year starting at age 1 if you have a 30-pack-year history of smoking and currently smoke or have quit within the past 15 years. Fecal occult blood test (FOBT) of the stool. You may have this test every year starting at age 32. Flexible sigmoidoscopy or colonoscopy. You may have a sigmoidoscopy every 5 years or a colonoscopy every 10 years starting at age 29. Hepatitis C blood test. Hepatitis B blood test. Sexually transmitted disease (STD) testing. Diabetes screening. This is done by checking your blood sugar (glucose) after you have not eaten for a while (fasting). You may have this done every 1-3 years. Bone density scan. This is done to screen for osteoporosis. You may have this done starting at age 66. Mammogram. This may be done every 1-2 years. Talk  to your health care provider about how often you should have regular mammograms. Talk with your health care provider about your test results, treatment options, and if necessary, the need for more tests. Vaccines  Your health care provider may recommend certain vaccines, such as: Influenza  vaccine. This is recommended every year. Tetanus, diphtheria, and acellular pertussis (Tdap, Td) vaccine. You may need a Td booster every 10 years. Zoster vaccine. You may need this after age 94. Pneumococcal 13-valent conjugate (PCV13) vaccine. One dose is recommended after age 61. Pneumococcal polysaccharide (PPSV23) vaccine. One dose is recommended after age 46. Talk to your health care provider about which screenings and vaccines you need and how often you need them. This information is not intended to replace advice given to you by your health care provider. Make sure you discuss any questions you have with your health care provider. Document Released: 03/30/2015 Document Revised: 11/21/2015 Document Reviewed: 01/02/2015 Elsevier Interactive Patient Education  2017 [employer].  Fall Prevention in the Home Falls can cause injuries. They can happen to people of all ages. There are many things you can do to make your home safe and to help prevent falls. What can I do on the outside of my home? Regularly fix the edges of walkways and driveways and fix any cracks. Remove anything that might make you trip as you walk through a door, such as a raised step or threshold. Trim any bushes or trees on the path to your home. Use bright outdoor lighting. Clear any walking paths of anything that might make someone trip, such as rocks or tools. Regularly check to see if handrails are loose or broken. Make sure that both sides of any steps have handrails. Any raised decks and porches should have guardrails on the edges. Have any leaves, snow, or ice cleared regularly. Use sand or salt on walking paths during winter. Clean up any spills in your garage right away. This includes oil or grease spills. What can I do in the bathroom? Use night lights. Install grab bars by the toilet and in the tub and shower. Do not use towel bars as grab bars. Use non-skid mats or decals in the tub or shower. If you  need to sit down in the shower, use a plastic, non-slip stool. Keep the floor dry. Clean up any water that spills on the floor as soon as it happens. Remove soap buildup in the tub or shower regularly. Attach bath mats securely with double-sided non-slip rug tape. Do not have throw rugs and other things on the floor that can make you trip. What can I do in the bedroom? Use night lights. Make sure that you have a light by your bed that is easy to reach. Do not use any sheets or blankets that are too big for your bed. They should not hang down onto the floor. Have a firm chair that has side arms. You can use this for support while you get dressed. Do not have throw rugs and other things on the floor that can make you trip. What can I do in the kitchen? Clean up any spills right away. Avoid walking on wet floors. Keep items that you use a lot in easy-to-reach places. If you need to reach something above you, use a strong step stool that has a grab bar. Keep electrical cords out of the way. Do not use floor polish or wax that makes floors slippery. If you must use wax, use non-skid floor wax. Do  not have throw rugs and other things on the floor that can make you trip. What can I do with my stairs? Do not leave any items on the stairs. Make sure that there are handrails on both sides of the stairs and use them. Fix handrails that are broken or loose. Make sure that handrails are as long as the stairways. Check any carpeting to make sure that it is firmly attached to the stairs. Fix any carpet that is loose or worn. Avoid having throw rugs at the top or bottom of the stairs. If you do have throw rugs, attach them to the floor with carpet tape. Make sure that you have a light switch at the top of the stairs and the bottom of the stairs. If you do not have them, ask someone to add them for you. What else can I do to help prevent falls? Wear shoes that: Do not have high heels. Have rubber  bottoms. Are comfortable and fit you well. Are closed at the toe. Do not wear sandals. If you use a stepladder: Make sure that it is fully opened. Do not climb a closed stepladder. Make sure that both sides of the stepladder are locked into place. Ask someone to hold it for you, if possible. Clearly mark and make sure that you can see: Any grab bars or handrails. First and last steps. Where the edge of each step is. Use tools that help you move around (mobility aids) if they are needed. These include: Canes. Walkers. Scooters. Crutches. Turn on the lights when you go into a dark area. Replace any light bulbs as soon as they burn out. Set up your furniture so you have a clear path. Avoid moving your furniture around. If any of your floors are uneven, fix them. If there are any pets around you, be aware of where they are. Review your medicines with your doctor. Some medicines can make you feel dizzy. This can increase your chance of falling. Ask your doctor what other things that you can do to help prevent falls. This information is not intended to replace advice given to you by your health care provider. Make sure you discuss any questions you have with your health care provider. Document Released: 12/28/2008 Document Revised: 08/09/2015 Document Reviewed: 04/07/2014 Elsevier Interactive Patient Education  2017 [employer].

## 2022-10-28 NOTE — Progress Notes (Signed)
Subjective:   Felicia Ross is a 77 y.o. female who presents for Medicare Annual (Subsequent) preventive examination.  Visit Complete: Virtual  I connected with  Felicia Ross on 10/28/22 by a audio enabled telemedicine application and verified that I am speaking with the correct person using two identifiers.  Patient Location: Home  Provider Location: Home Office  I discussed the limitations of evaluation and management by telemedicine. The patient expressed understanding and agreed to proceed.  Vital Signs: Unable to obtain new vitals due to this being a telehealth visit. HT and WT patient reported.  Review of Systems      Cardiac Risk Factors include: advanced age (>40men, >34 women)     Objective:    Today's Vitals   10/28/22 0847  Weight: 161 lb (73 kg)  Height: 5\' 7"  (1.702 m)   Body mass index is 25.22 kg/m.     10/28/2022    8:53 AM 08/15/2022    7:04 AM 07/18/2021   12:35 PM 03/28/2020   12:50 PM 03/20/2020    8:49 AM 08/09/2019    7:30 AM 05/02/2015   10:09 AM  Advanced Directives  Does Patient Have a Medical Advance Directive? Yes Yes Yes Yes Yes Yes Yes  Type of Estate agent of [place];Living will Healthcare Power of [place];Living will Healthcare Power of [employer] of [place];Living will Healthcare Power of [place];Living will  Healthcare Power of [place];Living will  Does patient want to make changes to medical advance directive?    No - Guardian declined     Copy of Healthcare Power of Attorney in Chart? No - copy requested No - copy requested No - copy requested No - copy requested     Would patient like information on creating a medical advance directive?  No - Patient declined         Current Medications (verified) Outpatient Encounter Medications as of 10/28/2022  Medication Sig   Cholecalciferol (VITAMIN D) 2000 units CAPS Take 1 capsule by mouth daily.   ipratropium (ATROVENT) 0.03 % nasal spray Place into  both nostrils.   levothyroxine (SYNTHROID) 112 MCG tablet TAKE 1 TABLET EVERY DAY   lithium carbonate (LITHOBID) 300 MG ER tablet TAKE 1 TABLET TWICE DAILY   mirtazapine (REMERON) 15 MG tablet TAKE 1 TABLET (15 MG TOTAL) BY MOUTH AT BEDTIME.   montelukast (SINGULAIR) 10 MG tablet Take 10 mg by mouth at bedtime.   Multiple Vitamin (MULTIVITAMIN) tablet Take 1 tablet by mouth daily.   Omega-3 Fatty Acids (FISH OIL) 1200 MG CAPS Take 1,200 mg by mouth 2 (two) times daily.    simvastatin (ZOCOR) 10 MG tablet TAKE 1 TABLET (10 MG TOTAL) BY MOUTH AT BEDTIME.   fluticasone (FLONASE) 50 MCG/ACT nasal spray PLACE 2 SPRAYS INTO BOTH NOSTRILS DAILY. (Patient not taking: Reported on 10/28/2022)   No facility-administered encounter medications on file as of 10/28/2022.    Allergies (verified) Fluzone [influenza virus vaccine] and Haemophilus b polysaccharide vaccine   History: Past Medical History:  Diagnosis Date   Arthritis    Bipolar disorder (HCC)    Depression    Hypercholesteremia    Hypothyroidism    Osteopenia    Thyroid goiter    Past Surgical History:  Procedure Laterality Date   BREAST BIOPSY Left 09/30/2022   Stereo Bx, coil clip, path pending   BREAST BIOPSY Left 09/30/2022   MM LT BREAST BX W LOC DEV 1ST LESION IMAGE BX SPEC STEREO GUIDE 09/30/2022 [facility]  COLONOSCOPY WITH PROPOFOL N/A 08/09/2019   Procedure: COLONOSCOPY WITH PROPOFOL;  Surgeon: Wyline Mood, MD;  Location[facility] END[facility]COPY;  Service: Gastroenterology;  Laterality: N/A;   COLONOSCOPY WITH PROPOFOL N/A 08/15/2022   Procedure: COLONOSCOPY WITH PROPOFOL;  Surgeon: Wyline Mood, MD;  Location[facility] ENDOSCOPY;  Service: Gastroenterology;  Laterality: N/A;   ESOPHAGOGASTRODUODENOSCOPY (EGD) WITH PROPOFOL N/A 08/15/2022   Procedure: ESOPHAGOGASTRODUODENOSCOPY (EGD) WITH PROPOFOL;  Surgeon: Wyline Mood, MD;  [facility]COPY;  Service: Gastroenterology;  Laterality: N/A;   JOINT REPLACEMENT     THYROID  SURGERY  03/17/2008   complete   TOTAL HIP ARTHROPLASTY Right 03/28/2020   Procedure: TOTAL HIP ARTHROPLASTY ANTERIOR APPROACH;  Surgeon: Ollen Gross, MD;  Location: [facility] ORS;  Service: Orthopedics;  Laterality: Right;    TUBAL LIGATION     Family History  Problem Relation Age of Onset   Ovarian cancer Mother    Hypertension Mother    Heart attack Father    Hypertension Brother    Breast cancer Maternal Aunt    Lung cancer Paternal Aunt    Breast cancer Paternal Aunt    Hypertension Other    Breast cancer Cousin    Social History   Socioeconomic History   Marital status: Married    Spouse name: Lewis    Number of children: 3   Years of education: College   Highest education level: Not on file  Occupational History    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    Social Determinants of Health   Financial Resource Strain: Low Risk  (10/28/2022)   Overall Financial Resource Strain (CARDIA)    Difficulty of Paying Living Expenses: Not hard at all  Food Insecurity: No Food Insecurity (10/28/2022)   Hunger Vital Sign    Worried About Running Out of Food in the Last Year: Never true    Ran Out of Food in the Last Year: Never true  Transportation Needs: No Transportation Needs (10/28/2022)   PRAPARE - Administrator, Civil Service (Medical): No    Lack of Transportation (Non-Medical): No  Physical Activity: Insufficiently Active (10/28/2022)   Exercise Vital Sign    Days of Exercise per Week: 4 days    Minutes of Exercise per Session: 30 min  Stress: No  Stress Concern Present (10/28/2022)   [employer] of Occupational Health - Occupational Stress Questionnaire    Feeling of Stress : Not at all  Social Connections: Socially Integrated (10/28/2022)   Social Connection and Isolation Panel [NHANES]    Frequency of Communication with Friends and Family: More than three times a week    Frequency of Social Gatherings with Friends and Family: More than three times a week    Attends Religious Services: More than 4 times per year    Active Member of [employer] or Organizations: Yes    Attends Engineer, structural: More than 4 times per year    Marital Status: Married    Tobacco Counseling Counseling given: Not  Answered   Clinical Intake:  Pre-visit preparation completed: Yes  Pain : No/denies pain     BMI - recorded: 25.22 Nutritional Status: BMI 25 -29 Overweight Nutritional Risks: None Diabetes: No  How often do you need to have someone help you when you read instructions, pamphlets, or other written materials from your doctor or pharmacy?: 1 - Never  Interpreter Needed?: No  Information entered by :: C. LPN   Activities of Daily Living    10/28/2022    8:55 AM  In your present state of health, do you have any difficulty performing the following activities:  Hearing? 0  Vision? 0  Difficulty concentrating or making decisions? 0  Walking or climbing stairs? 0  Dressing or bathing? 0  Doing errands, shopping? 0  Preparing Food and eating ? N  Using the Toilet? N  In the past six months, have you accidently leaked urine? N  Do you have problems with loss of bowel control? N  Managing your Medications? N  Managing your Finances? N  Housekeeping or managing your Housekeeping? N    Patient Care Team: Mort Sawyers, FNP as PCP - General (Family Medicine)  Indicate any recent Medical Services you may have received from other than [facility] providers in the past year (date may be approximate).     Assessment:    This is a routine wellness examination for [place].  Hearing/Vision screen Hearing Screening - Comments:: Denies hearing difficulties   Vision Screening - Comments:: Glasses - [facility] - UTD on eye exams  Dietary issues and exercise activities discussed:     Goals Addressed             This Visit's Progress    Patient Stated       Stay active and make smart food choices.       Depression Screen    10/28/2022    8:53 AM 07/01/2022    9:18 AM 07/18/2021   12:42 PM 06/18/2020    8:45 AM 03/06/2020    8:50 AM 06/16/2019    8:30 AM 05/11/2018    9:14 AM  PHQ 2/9 Scores  PHQ - 2 Score 0 0 0 0 0 0 0  PHQ- 9 Score    1   0    Fall Risk    10/28/2022    8:55 AM 07/01/2022    9:17 AM 07/18/2021   12:38 PM 06/26/2021    9:04 AM 06/18/2020    8:45 AM  Fall Risk   Falls in the past year? 0 0 0 0 0  Number falls in past yr: 0 0 0 0   Injury with Fall? 0 0 0 0   Risk for fall due to : No Fall Risks   No Fall Risks   Follow up Falls prevention discussed;Falls evaluation completed Falls evaluation completed;Education provided;Falls prevention discussed Falls evaluation completed;Education provided;Falls prevention discussed      MEDICARE RISK AT HOME:  Medicare Risk at Home - 10/28/22 0855     Any stairs in or around the home? Yes    If so, are there any without handrails? No    Home free of loose throw rugs in walkways, pet beds, electrical cords, etc? Yes    Adequate lighting in your home to reduce risk of falls? Yes    Life alert? No    Use of a cane, walker or w/c? No    Grab bars in the bathroom? Yes    Shower chair  or bench in shower? Yes    Elevated toilet seat or a handicapped toilet? Yes             TIMED UP AND GO:  Was the test performed?  No    Cognitive Function:        10/28/2022    8:56 AM 07/18/2021   12:37 PM  6CIT Screen  What Year? 0 points 0 points  What month? 0 points 0 points  What time? 0 points 0 points  Count back from 20 0  points 0 points  Months in reverse 0 points 0 points  Repeat phrase 0 points 2 points  Total Score 0 points 2 points    Immunizations Immunization History  Administered Date(s) Administered   Influenza Inj Mdck Quad Pf 12/28/2016, 12/25/2017, 12/14/2018   Influenza Split 01/16/2015   Influenza, High Dose Seasonal PF 12/19/2015   Influenza,inj,Quad PF,6+ Mos 12/28/2016, 12/25/2017, 12/05/2019   Influenza-Unspecified 12/22/2013   Moderna Sars-Covid-2 Vaccination 05/20/2019, 06/17/2019, 01/31/2020   Pneumococcal Conjugate-13 05/30/2013   Pneumococcal Polysaccharide-23 04/21/2012   Pneumococcal-Unspecified 01/05/2013   Tdap 01/20/2005, 07/15/2021   Zoster Recombinant(Shingrix) 05/19/2018, 12/01/2018   Zoster, Live 04/02/2007    TDAP status: Up to date  Flu Vaccine status: Due, Education has been provided regarding the importance of this vaccine. Advised may receive this vaccine at local pharmacy or Health Dept. Aware to provide a copy of the vaccination record if obtained from local pharmacy or Health Dept. Verbalized acceptance and understanding.  Pneumococcal vaccine status: Up to date  Covid-19 vaccine status: Information provided on how to obtain vaccines.   Qualifies for Shingles Vaccine? Yes   Zostavax completed Yes   Shingrix Completed?: Yes  Screening Tests Health Maintenance  Topic Date Due   DEXA SCAN  09/28/2022   INFLUENZA VACCINE  10/16/2022   COVID-19 Vaccine (4 - 2023-24 season) 07/01/2023 (Originally 11/15/2021)   Medicare Annual Wellness (AWV)  10/28/2023   Colonoscopy  08/14/2025   DTaP/Tdap/Td (3 - Td or Tdap) 07/16/2031   Pneumonia Vaccine 55+ Years old  Completed   Hepatitis C Screening  Completed   Zoster Vaccines- Shingrix  Completed   HPV VACCINES  Aged Out    Health Maintenance  Health Maintenance Due  Topic Date Due   DEXA SCAN  09/28/2022   INFLUENZA VACCINE  10/16/2022    Colorectal cancer screening: No longer required.   Mammogram  status: Completed 09/16/22. Repeat every year  Bone Density status: Completed 09/27/20. Results reflect: Bone density results: OSTEOPENIA. Repeat every 2 years. Pt declined  Lung Cancer Screening: (Low Dose CT Chest recommended if Age 43-80 years, 20 pack-year currently smoking OR have quit w/in 15years.) does not qualify.   Lung Cancer Screening Referral: no  Additional Screening:  Hepatitis C Screening: does qualify; Completed 05/05/16  Vision Screening: Recommended annual ophthalmology exams for early detection of glaucoma and other disorders of the eye. Is the patient up to date with their annual eye exam?  Yes  Who is the provider or what is the name of the office in which the patient attends annual eye exams? [facility] If pt is not established with a provider, would they like to be referred to a provider to establish care? No .   Dental Screening: Recommended annual dental exams for proper oral hygiene    Community Resource Referral / Chronic Care Management: CRR required this visit?  No   CCM required this visit?  No     Plan:  I have personally reviewed and noted the following in the patient's chart:   Medical and social history Use of alcohol, tobacco or illicit drugs  Current medications and supplements including opioid prescriptions. Patient is not currently taking opioid prescriptions. Functional ability and status Nutritional status Physical activity Advanced directives List of other physicians Hospitalizations, surgeries, and ER visits in previous 12 months Vitals Screenings to include cognitive, depression, and falls Referrals and appointments  In addition, I have reviewed and discussed with patient certain preventive protocols, quality metrics, and best practice recommendations. A written personalized care plan for preventive services as well as general preventive health recommendations were provided to patient.     Maryan Puls,  LPN   1/61/0960   After Visit Summary: (MyChart) Due to this being a telephonic visit, the after visit summary with patients personalized plan was offered to patient via MyChart   Nurse Notes: none

## 2022-11-20 DIAGNOSIS — R251 Tremor, unspecified: Secondary | ICD-10-CM | POA: Diagnosis not present

## 2022-11-20 DIAGNOSIS — Z8659 Personal history of other mental and behavioral disorders: Secondary | ICD-10-CM | POA: Diagnosis not present

## 2022-11-20 DIAGNOSIS — G479 Sleep disorder, unspecified: Secondary | ICD-10-CM | POA: Diagnosis not present

## 2023-01-10 ENCOUNTER — Other Ambulatory Visit: Payer: Self-pay | Admitting: Family

## 2023-01-10 DIAGNOSIS — E7800 Pure hypercholesterolemia, unspecified: Secondary | ICD-10-CM

## 2023-01-10 DIAGNOSIS — F3170 Bipolar disorder, currently in remission, most recent episode unspecified: Secondary | ICD-10-CM

## 2023-02-10 DIAGNOSIS — G250 Essential tremor: Secondary | ICD-10-CM | POA: Diagnosis not present

## 2023-02-10 DIAGNOSIS — G479 Sleep disorder, unspecified: Secondary | ICD-10-CM | POA: Diagnosis not present

## 2023-02-10 DIAGNOSIS — F32A Depression, unspecified: Secondary | ICD-10-CM | POA: Diagnosis not present

## 2023-02-13 ENCOUNTER — Other Ambulatory Visit: Payer: Self-pay | Admitting: Family

## 2023-02-13 DIAGNOSIS — E890 Postprocedural hypothyroidism: Secondary | ICD-10-CM

## 2023-02-18 DIAGNOSIS — D2262 Melanocytic nevi of left upper limb, including shoulder: Secondary | ICD-10-CM | POA: Diagnosis not present

## 2023-02-18 DIAGNOSIS — D2272 Melanocytic nevi of left lower limb, including hip: Secondary | ICD-10-CM | POA: Diagnosis not present

## 2023-02-18 DIAGNOSIS — D2261 Melanocytic nevi of right upper limb, including shoulder: Secondary | ICD-10-CM | POA: Diagnosis not present

## 2023-02-18 DIAGNOSIS — L821 Other seborrheic keratosis: Secondary | ICD-10-CM | POA: Diagnosis not present

## 2023-02-18 DIAGNOSIS — D225 Melanocytic nevi of trunk: Secondary | ICD-10-CM | POA: Diagnosis not present

## 2023-02-18 DIAGNOSIS — Z85828 Personal history of other malignant neoplasm of skin: Secondary | ICD-10-CM | POA: Diagnosis not present

## 2023-02-18 DIAGNOSIS — L981 Factitial dermatitis: Secondary | ICD-10-CM | POA: Diagnosis not present

## 2023-02-26 DIAGNOSIS — R490 Dysphonia: Secondary | ICD-10-CM | POA: Diagnosis not present

## 2023-02-26 DIAGNOSIS — K219 Gastro-esophageal reflux disease without esophagitis: Secondary | ICD-10-CM | POA: Diagnosis not present

## 2023-02-26 DIAGNOSIS — J301 Allergic rhinitis due to pollen: Secondary | ICD-10-CM | POA: Diagnosis not present

## 2023-03-05 ENCOUNTER — Other Ambulatory Visit: Payer: Self-pay | Admitting: Family

## 2023-03-05 DIAGNOSIS — E890 Postprocedural hypothyroidism: Secondary | ICD-10-CM | POA: Diagnosis not present

## 2023-03-05 DIAGNOSIS — F3170 Bipolar disorder, currently in remission, most recent episode unspecified: Secondary | ICD-10-CM

## 2023-03-19 DIAGNOSIS — E890 Postprocedural hypothyroidism: Secondary | ICD-10-CM | POA: Diagnosis not present

## 2023-04-01 DIAGNOSIS — H2513 Age-related nuclear cataract, bilateral: Secondary | ICD-10-CM | POA: Diagnosis not present

## 2023-04-01 DIAGNOSIS — H02889 Meibomian gland dysfunction of unspecified eye, unspecified eyelid: Secondary | ICD-10-CM | POA: Diagnosis not present

## 2023-08-21 ENCOUNTER — Other Ambulatory Visit: Payer: Self-pay | Admitting: Family

## 2023-08-21 DIAGNOSIS — Z1231 Encounter for screening mammogram for malignant neoplasm of breast: Secondary | ICD-10-CM

## 2023-08-25 DIAGNOSIS — Z1331 Encounter for screening for depression: Secondary | ICD-10-CM | POA: Diagnosis not present

## 2023-08-25 DIAGNOSIS — G250 Essential tremor: Secondary | ICD-10-CM | POA: Diagnosis not present

## 2023-08-25 DIAGNOSIS — G479 Sleep disorder, unspecified: Secondary | ICD-10-CM | POA: Diagnosis not present

## 2023-08-25 DIAGNOSIS — T50905A Adverse effect of unspecified drugs, medicaments and biological substances, initial encounter: Secondary | ICD-10-CM | POA: Diagnosis not present

## 2023-08-25 DIAGNOSIS — Z8659 Personal history of other mental and behavioral disorders: Secondary | ICD-10-CM | POA: Diagnosis not present

## 2023-08-25 NOTE — Progress Notes (Signed)
 Today the history is gathered from: 100% - patient  0% - alone  RECORDS SUMMARY: I have reviewed the note dated 07/01/2022 from Ginger Patrick, FNP who has indicated:  Tremors in bilatearl hands.  Given these abnormal neurologic findings, a referral to neurology has been recommended.  REFERRING PHYSICIAN: Pcp PRIMARY CARE PHYSICIAN:  Patrick Sievert, NP  IMPRESSION/PLAN  Felicia Ross is a 78 y.o. female presenting for evaluation of  ESSENTIAL TREMORS/ DIFFICULTY SLEEPING/ HX of DEPRESSION - Unchanged. - Patient with unchanged tremor in the right hand. Sleep is poor. No longer taking Gabapentin 200 mg BID. Taking Remeron  15 mg nightly.  - Start taking Primidone 25 mg nightly for 2 weeks, then increase to 50 mg nightly for tremors. - Stop taking Gabapentin 200 mg BID. Do not refill. - Symptoms most consistent with medication-induced and essential tremor at this time. Will continue to monitor at future visits. - Symptoms not consistent with Parkinson's disease. Will continue to monitor at future visits. - Could consider restarting Gabapentin at future visit if worsening tremor.  - Could consider decreasing Lithium  to 150 mg in the morning and 300 mg in the evening at future visit.  Medications previously tried: Benzotropine 0.5 mg nightly - ineffective for tremors, blurry vision Gabapentin 200 mg BID (dizziness)  Follow-up with Dr. Lane in 6 months  p=4   CHIEF COMPLAINT & HPI  Felicia Ross is a 78 y.o. female presenting for evaluation of: Chief Complaint  Patient presents with  . ESSENTIAL TREMORS/ DIFFICULTY SLEEPING/ HX of DEPRESSION    ESSENTIAL TREMORS/ DIFFICULTY SLEEPING/ HX of DEPRESSION Patient with unchanged tremor in the bilateral hands and head, not interfering with ADLs. Slight tremor with handwriting, worse in the right hand. No numbness, tingling, or pain. Denies worsening tremor when she stopped Gabapentin. Sleep is poor. Sleeping 8-9 hours nightly. Mood is well. No  longer taking Gabapentin 200 mg BID. Reports she stopped taking Gabapentin for 4 days and when she restarted it, she experienced side effects. Taking Remeron  15 mg nightly.   DATA SUMMARY: No relevant data at this time.   VISIT SUMMARIES:   MEDICATIONS Current Outpatient Medications  Medication Sig Dispense Refill  . cholecalciferol (VITAMIN D3) 2,000 unit capsule Take 1 capsule by mouth once daily    . docosahexaenoic acid/epa (FISH OIL ORAL) Take by mouth once daily    . gabapentin (NEURONTIN) 100 MG capsule Take 100 mg twice a day for one week, then increase to 200 mg(2 tablets) twice a day and continue 120 capsule 3  . levothyroxine  (SYNTHROID ) 112 MCG tablet Take 1 tablet (112 mcg total) by mouth once daily Take on an empty stomach with a glass of water  at least 30-60 minutes before breakfast. 90 tablet 4  . lithium  (LITHOBID ) 300 MG CR tablet Take 300 mg by mouth 2 (two) times daily.    . mirtazapine  (REMERON ) 15 MG tablet Take 15 mg by mouth nightly    . multivitamin tablet Take 1 tablet by mouth once daily    . simvastatin  (ZOCOR ) 10 MG tablet Take 10 mg by mouth nightly    . benztropine (COGENTIN) 0.5 MG tablet Take 1 tablet (0.5 mg total) by mouth once daily (Patient not taking: Reported on 08/25/2023) 30 tablet 1  . primidone (MYSOLINE) 50 MG tablet Take 25 mg ( 1/2 tab) nightly for 2 weeks, then increase to 50 mg nightly for tremors. 30 tablet 1  . traZODone (DESYREL) 50 MG tablet Take 1/2 tab at night for sleep.  Can take 1 whole tab if needed at night if 1/2 tab doesn't help with sleep. (Patient not taking: Reported on 08/25/2023) 30 tablet 1   No current facility-administered medications for this visit.    ALLERGIES Allergies  Allergen Reactions  . Haemophilus Influenzae Type B Hives    High dose only---hives, itchy     EXAM   Vitals:   08/25/23 0819  Weight: 71.7 kg (158 lb)  Height: 170.2 cm (5' 7)  PainSc: 0-No pain       Body mass index is 24.75  kg/m.  GENERAL: Pleasant female, NAD. Normocephalic and atraumatic.  EYES: Funduscopic exam shows normal disc size, appearance and C/D ratio without clear evidence of papilledema.  Not assessed.  MUSCULOSKELETAL: Bulk - Normal Tone - Normal Pronator Drift - Absent bilaterally. Ambulation - Gait and station is intact. Romberg - negative. Tremor - Tremors in bilateral hands. Moderate head tremor. Mild tremor in 2nd digit of left hand when ambulating.  R/L 5/5    Shoulder abduction (deltoid/supraspinatus, axillary/suprascapular n, C5) 5/5    Elbow flexion (biceps brachii, musculoskeletal n, C5-6) 5/5    Elbow extension (triceps, radial n, C7) 5/5    Finger adduction (interossei, ulnar n, T1)  5/5    Hip flexion (iliopsoas, L1/L2) 5/5    Knee flexion (hamstrings, sciatic n, L5/S1)  5/5    Knee extension (quadriceps, femoral n, L3/4) 5/5    Ankle dorsiflexion (tibialis anterior, deep fibular n, L4/5) 5/5    Ankle plantarflexion (gastroc, tibial n, S1)   NEUROLOGICAL: MENTAL STATUS: Patient is oriented to person, place and time.   Short-term memory is not assessed today. Long-term memory is intact.   Attention span and concentration are intact.   Naming and repetition are intact. Comprehension is intact.   Expressive speech is intact.   Patient's fund of knowledge is within normal limits for educational level.  CRANIAL NERVES: Visual acuity and visual fields are intact         Extraocular muscles are intact                        Facial sensation is intact bilaterally                Facial strength is intact bilaterally                   Hearing is intact bilaterally                              Palate elevates midline, normal phonation     Shoulder shrug strength is intact                    Tongue protrudes midline                       SENSATION: Pain and temperature (spinothalamic tracts) is normal. Position and vibration (dorsal columns) is  normal.  REFLEXES: R/L 2+/2+    Biceps 2+/2+    Brachioradialis  2+/2+    Patellar 2+/2+    Achilles  COORDINATION/CEREBELLAR: Finger to nose testing is normal.       PAST MEDICAL HISTORY Past Medical History:  Diagnosis Date  . Chronic obstructive pulmonary disease (CMS/HHS-HCC) 05/23/2014   Overview:   pulmonary function tests 08/24/2014   FEC 71%   FEV1 65%   FEV1/FVC 69%   RV 100%  TLC 86%   RV/TLC 124%   ERV 42%   DLCO corrected 123%   Impression: Moderate obstructive process, no significant bronchodilator response, significant drop in ERV.   6 minute walk test: 300 cm/1017 feet lowest saturation 95%, highest heart rate 77, no complaints after the test.        Last Assessment & P  . Depression   . High cholesterol   . Hypothyroid     PAST SURGICAL HISTORY Past Surgical History:  Procedure Laterality Date  . COLONOSCOPY    . THYROIDECTOMY TOTAL    . TUBAL LIGATION      FAMILY HISTORY Family History  Problem Relation Name Age of Onset  . Ovarian cancer Mother    . High blood pressure (Hypertension) Mother    . Breast cancer Maternal Aunt    . Breast cancer Paternal Aunt    . Myocardial Infarction (Heart attack) Father         COD  . No Known Problems Sister    . No Known Problems Maternal Grandmother    . No Known Problems Maternal Grandfather    . No Known Problems Paternal Grandmother    . Tuberculosis Paternal Grandfather         COD  . Skin cancer Brother    . Thyroid  disease Daughter         RAI to correct  . No Known Problems Daughter    . No Known Problems Son      SOCIAL HISTORY  Social History   Tobacco Use  . Smoking status: Never  . Smokeless tobacco: Never  Vaping Use  . Vaping status: Never Used  Substance Use Topics  . Alcohol use: No  . Drug use: Not Currently     REVIEW OF SYSTEMS:  13 system ROS form was given to the patient to complete and I have reviewed it.  The form was sent for scan to the patient's EHR.  Pertinent  positives and negatives are mentioned above in the HPI and all other systems are negative.   DATA  I have personally reviewed all of the data outlined below both prior to the appointment and during the appointment with the patient as appropriate.  Appointment on 03/05/2023  Component Date Value Ref Range Status  . Thyroid  Stimulating Hormone (TSH) 03/05/2023 3.367  0.450-5.330 uIU/ml uIU/mL Final  . Thyroxine, Free (FT4) 03/05/2023 1.16 (H)  0.66 - 1.14 ng/dL Final      No follow-ups on file.  Payor: HU[employer]NA MEDICARE ADVANTAGE PLANS / Plan: HU[employer] HMO GOLD PLUS / Product Type: HMO /   This note is partially written by Tyron Koyanagi, in the presence of and acting as the scribe of Dr. Arthea Farrow.   I have reviewed, edited and added to the note as needed to reflect my best personal medical judgment.    Dr. Arthea Farrow, MD [facility] [place], [place] Ph:  [phone] Fax:  [phone]

## 2023-09-01 DIAGNOSIS — K219 Gastro-esophageal reflux disease without esophagitis: Secondary | ICD-10-CM | POA: Diagnosis not present

## 2023-09-16 ENCOUNTER — Other Ambulatory Visit: Payer: Self-pay | Admitting: Family

## 2023-09-16 DIAGNOSIS — E890 Postprocedural hypothyroidism: Secondary | ICD-10-CM

## 2023-09-16 NOTE — Telephone Encounter (Signed)
 Lvmtcb. No mychart set up to send message

## 2023-09-16 NOTE — Telephone Encounter (Unsigned)
 Copied from CRM [id number]. Topic: General - Call Back - No Documentation >> Sep 16, 2023  2:55 PM Chiquita SQUIBB wrote: Reason for CRM: Patient is calling in stating she received a voicemail from [place] regarding her medication. Patient would like a call back to go over this.

## 2023-09-17 NOTE — Telephone Encounter (Signed)
 Spoke to pt, sch cpe for 11/11/23

## 2023-10-06 ENCOUNTER — Ambulatory Visit
Admission: RE | Admit: 2023-10-06 | Discharge: 2023-10-06 | Disposition: A | Discharge status: Home / Self Care | Source: Ambulatory Visit | Attending: Family | Admitting: Family

## 2023-10-06 DIAGNOSIS — Z1231 Encounter for screening mammogram for malignant neoplasm of breast: Secondary | ICD-10-CM | POA: Insufficient documentation

## 2023-10-08 ENCOUNTER — Ambulatory Visit: Payer: Self-pay | Admitting: Family

## 2023-10-08 ENCOUNTER — Other Ambulatory Visit: Payer: Self-pay | Admitting: Family

## 2023-10-08 DIAGNOSIS — R928 Other abnormal and inconclusive findings on diagnostic imaging of breast: Secondary | ICD-10-CM

## 2023-10-08 NOTE — Progress Notes (Signed)
 noted

## 2023-10-12 DIAGNOSIS — K219 Gastro-esophageal reflux disease without esophagitis: Secondary | ICD-10-CM | POA: Diagnosis not present

## 2023-10-12 DIAGNOSIS — M26603 Bilateral temporomandibular joint disorder, unspecified: Secondary | ICD-10-CM | POA: Diagnosis not present

## 2023-10-16 ENCOUNTER — Ambulatory Visit
Admission: RE | Admit: 2023-10-16 | Discharge: 2023-10-16 | Disposition: A | Discharge status: Home / Self Care | Source: Ambulatory Visit | Attending: Family | Admitting: Family

## 2023-10-16 ENCOUNTER — Other Ambulatory Visit: Payer: Self-pay | Admitting: Family

## 2023-10-16 DIAGNOSIS — R928 Other abnormal and inconclusive findings on diagnostic imaging of breast: Secondary | ICD-10-CM

## 2023-10-16 DIAGNOSIS — N6012 Diffuse cystic mastopathy of left breast: Secondary | ICD-10-CM | POA: Diagnosis not present

## 2023-10-16 DIAGNOSIS — D242 Benign neoplasm of left breast: Secondary | ICD-10-CM | POA: Diagnosis not present

## 2023-10-16 DIAGNOSIS — R92322 Mammographic fibroglandular density, left breast: Secondary | ICD-10-CM | POA: Diagnosis not present

## 2023-10-19 ENCOUNTER — Ambulatory Visit: Payer: Self-pay | Admitting: Family

## 2023-10-19 NOTE — Progress Notes (Signed)
 noted

## 2023-10-21 ENCOUNTER — Other Ambulatory Visit: Payer: Self-pay | Admitting: Family

## 2023-10-21 DIAGNOSIS — F3170 Bipolar disorder, currently in remission, most recent episode unspecified: Secondary | ICD-10-CM

## 2023-10-21 DIAGNOSIS — E7800 Pure hypercholesterolemia, unspecified: Secondary | ICD-10-CM

## 2023-11-03 ENCOUNTER — Ambulatory Visit (INDEPENDENT_AMBULATORY_CARE_PROVIDER_SITE_OTHER): Payer: Medicare HMO

## 2023-11-03 VITALS — BP 122/80 | Ht 67.0 in | Wt 157.6 lb

## 2023-11-03 DIAGNOSIS — Z0000 Encounter for general adult medical examination without abnormal findings: Secondary | ICD-10-CM | POA: Diagnosis not present

## 2023-11-03 NOTE — Progress Notes (Addendum)
 Subjective:   Felicia Ross is a 78 y.o. who presents for a Medicare Wellness preventive visit.  As a reminder, Annual Wellness Visits don't include a physical exam, and some assessments may be limited, especially if this visit is performed virtually. We may recommend an in-person follow-up visit with your provider if needed.  Visit Complete: In person  Persons Participating in Visit: Patient.  AWV Questionnaire: No: Patient Medicare AWV questionnaire was not completed prior to this visit.  Cardiac Risk Factors include: advanced age (>24men, >21 women);dyslipidemia     Objective:    Today's Vitals   11/03/23 0933  Weight: 157 lb 9.6 oz (71.5 kg)  Height: 5' 7 (1.702 m)   Body mass index is 24.68 kg/m.     11/03/2023    9:47 AM 10/28/2022    8:53 AM 08/15/2022    7:04 AM 07/18/2021   12:35 PM 03/28/2020   12:50 PM 03/20/2020    8:49 AM 08/09/2019    7:30 AM  Advanced Directives  Does Patient Have a Medical Advance Directive? Yes Yes Yes Yes Yes Yes Yes  Type of Estate agent of [place];Living will Healthcare Power of [place];Living will Healthcare Power of [place];Living will Healthcare Power of [employer] of [place];Living will Healthcare Power of [place];Living will   Does patient want to make changes to medical advance directive?     No - Guardian declined    Copy of Healthcare Power of Attorney in Chart? No - copy requested No - copy requested No - copy requested No - copy requested No - copy requested    Would patient like information on creating a medical advance directive?   No - Patient declined        Current Medications (verified) Outpatient Encounter Medications as of 11/03/2023  Medication Sig   Cholecalciferol (VITAMIN D ) 2000 units CAPS Take 1 capsule by mouth daily.   ipratropium (ATROVENT) 0.03 % nasal spray Place into both nostrils.   levothyroxine  (SYNTHROID ) 112 MCG tablet TAKE 1 TABLET EVERY DAY   lithium   carbonate (LITHOBID ) 300 MG ER tablet TAKE 1 TABLET TWICE DAILY   mirtazapine  (REMERON ) 15 MG tablet TAKE 1 TABLET AT BEDTIME   Multiple Vitamin (MULTIVITAMIN) tablet Take 1 tablet by mouth daily.   Omega-3 Fatty Acids (FISH OIL) 1200 MG CAPS Take 1,200 mg by mouth 2 (two) times daily.    primidone (MYSOLINE) 50 MG tablet Take 50 mg by mouth daily.   simvastatin  (ZOCOR ) 10 MG tablet TAKE 1 TABLET AT BEDTIME   fluticasone  (FLONASE ) 50 MCG/ACT nasal spray PLACE 2 SPRAYS INTO BOTH NOSTRILS DAILY. (Patient not taking: Reported on 11/03/2023)   montelukast  (SINGULAIR ) 10 MG tablet Take 10 mg by mouth at bedtime. (Patient not taking: Reported on 11/03/2023)   No facility-administered encounter medications on file as of 11/03/2023.    Allergies (verified) Fluzone [influenza virus vaccine] and Haemophilus b polysaccharide vaccine   History: Past Medical History:  Diagnosis Date   Arthritis    Bipolar disorder (HCC)    Depression    Hypercholesteremia    Hypothyroidism    Osteopenia    Thyroid  goiter    Past Surgical History:  Procedure Laterality Date   BREAST BIOPSY Left 09/30/2022   Stereo Bx, coil clip, path pending   BREAST BIOPSY Left 09/30/2022   MM LT BREAST BX W LOC DEV 1ST LESION IMAGE BX SPEC STEREO GUIDE 09/30/2022 [facility]   COLONOSCOPY WITH PROPOFOL  N/A 08/09/2019   Procedure: COLONOSCOPY WITH  PROPOFOL ;  Surgeon: Therisa Bi, MD;  Location: [facility] ENDOSCOPY;  Service: Gastroenterology;  Laterality: N/A;   COLONOSCOPY WITH PROPOFOL  N/A 08/15/2022   Procedure: COLONOSCOPY WITH PROPOFOL ;  Surgeon: Therisa Bi, MD;  Location:  [facility] ENDOSCOPY;  Service: Gastroenterology;  Laterality: N/A;   ESOPHAGOGASTRODUODENOSCOPY (EGD) WITH PROPOFOL  N/A 08/15/2022   Procedure: ESOPHAGOGASTRODUODENOSCOPY (EGD) WITH PROPOFOL ;  Surgeon: Therisa Bi, MD;  Location: [facility] ENDOSCOPY;  Service: Gastroenterology;  Laterality: N/A;   JOINT REPLACEMENT     THYROID  SURGERY  03/17/2008   complete    TOTAL HIP ARTHROPLASTY Right 03/28/2020   Procedure: TOTAL HIP ARTHROPLASTY ANTERIOR APPROACH;  Surgeon: Melodi Lerner, MD;  Location: [facility] ORS;  Service: Orthopedics;  Laterality: Right;    TUBAL LIGATION     Family History  Problem Relation Age of Onset   Ovarian cancer Mother    Hypertension Mother    Heart attack Father    Hypertension Brother    Breast cancer Maternal Aunt    Lung cancer Paternal Aunt    Breast cancer Paternal Aunt    Hypertension Other    Breast cancer Cousin    Social History   Socioeconomic History   Marital status: Married    Spouse name: Lewis    Number of children: 3   Years of education: College   Highest education level: Not on file  Occupational History    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    Social Drivers of Corporate investment banker Strain: Low Risk  (11/03/2023)   Overall Financial Resource Strain (CARDIA)    Difficulty of Paying Living Expenses: Not hard at all  Food Insecurity: No Food Insecurity (11/03/2023)   Hunger Vital Sign    Worried About Running Out of Food in the Last Year: Never true    Ran Out of Food in the Last Year: Never true  Transportation Needs: No Transportation Needs (11/03/2023)   PRAPARE - Administrator, Civil Service (Medical): No    Lack of Transportation (Non-Medical): No  Physical Activity: Insufficiently Active (11/03/2023)   Exercise Vital Sign    Days of Exercise per Week: 4 days    Minutes of Exercise per Session: 30 min  Stress: No Stress Concern Present (11/03/2023)    [employer] of Occupational Health - Occupational Stress Questionnaire    Feeling of Stress: Not at all  Social Connections: Socially Integrated (11/03/2023)   Social Connection and Isolation Panel    Frequency of Communication with Friends and Family: More than three times a week    Frequency of Social Gatherings with Friends and Family: More than three times a week    Attends Religious Services: More than 4 times per year    Active Member of [employer] or Organizations: Yes    Attends Engineer, structural: More than 4 times per year    Marital Status: Married    Tobacco Counseling Counseling given: Not Answered    Clinical Intake:  Pre-visit preparation completed: Yes  Pain : No/denies pain    BMI - recorded: 24.68 Nutritional Status: BMI of 19-24  Normal Nutritional Risks: None Diabetes: No  Lab Results  Component Value Date   HGBA1C 5.6 03/06/2020     How often do you need to have someone help you when you read instructions, pamphlets, or other written materials from your doctor or pharmacy?: 1 - Never  Interpreter Needed?: No  Comments: lives with husband Information entered by :: B.Londell Noll,LPN   Activities of Daily Living     11/03/2023    9:48 AM  In your present state of health, do you have any difficulty performing the following activities:  Hearing? 0  Vision? 0  Difficulty concentrating or making decisions? 0  Walking or climbing stairs? 0  Dressing or bathing? 0  Doing errands, shopping? 0  Preparing Food and eating ? N  Using the Toilet? N  In the past six months, have you accidently leaked urine? N  Do you have problems with loss of bowel control? N  Managing your Medications? N  Managing your Finances? N  Housekeeping or managing your Housekeeping? N    Patient Care Team: [name], FNP as PCP - General (Family Medicine) Pa, [facility] (Optometry)  I have updated your Care Teams any recent Medical Services you may  have received from other providers in the past year.     Assessment:   This is a routine wellness examination for [place].  Hearing/Vision screen Hearing Screening - Comments:: Patient denies any hearing difficulties.   Vision Screening - Comments:: Pt says her vision is good w/glasses [facility]   Goals Addressed             This Visit's Progress    COMPLETED: Patient Stated       No goals      Patient Stated       11/02/23-Stay active and make smart food choices.       Depression Screen     11/03/2023    9:44 AM 10/28/2022    8:53 AM 07/01/2022    9:18 AM 07/18/2021   12:42 PM 06/18/2020    8:45 AM 03/06/2020    8:50 AM 06/16/2019    8:30 AM  PHQ 2/9 Scores  PHQ - 2 Score 0 0 0 0 0 0 0  PHQ- 9 Score     1      Fall Risk     11/03/2023    9:37 AM 10/28/2022    8:55 AM 07/01/2022    9:17 AM 07/18/2021   12:38 PM 06/26/2021    9:04 AM  Fall Risk   Falls in the past year? 0 0 0 0 0  Number falls in past yr: 0 0 0 0 0  Injury with Fall? 0 0 0 0 0  Risk for fall due to : No Fall Risks No Fall Risks   No Fall Risks  Follow up Education provided;Falls prevention discussed Falls prevention discussed;Falls evaluation completed Falls evaluation completed;Education provided;Falls prevention discussed Falls evaluation completed;Education provided;Falls prevention discussed       Data saved with a previous flowsheet row definition    MEDICARE RISK AT HOME:  Medicare Risk at Home Any stairs in or around the home?: Yes If so, are there any without handrails?: Yes Home free of loose throw rugs in walkways, pet beds, electrical cords, etc?: Yes Adequate lighting in your home to reduce risk of falls?: Yes Life alert?: No Use of a cane,  walker or w/c?: No Grab bars in the bathroom?: Yes Shower chair or bench in shower?: Yes Elevated toilet seat or a handicapped toilet?: Yes  TIMED UP AND GO:  Was the test performed?  Yes  Length of time to ambulate 10 feet: 12 sec Gait slow  and steady without use of assistive device  Cognitive Function: 6CIT completed        11/03/2023    9:48 AM 10/28/2022    8:56 AM 07/18/2021   12:37 PM  6CIT Screen  What Year? 0 points 0 points 0 points  What month? 0 points 0 points 0 points  What time? 0 points 0 points 0 points  Count back from 20 0 points 0 points 0 points  Months in reverse 0 points 0 points 0 points  Repeat phrase 0 points 0 points 2 points  Total Score 0 points 0 points 2 points    Immunizations Immunization History  Administered Date(s) Administered   Influenza Inj Mdck Quad Pf 12/28/2016, 12/25/2017, 12/14/2018   Influenza Split 01/16/2015   Influenza, High Dose Seasonal PF 12/19/2015   Influenza,inj,Quad PF,6+ Mos 12/28/2016, 12/25/2017, 12/05/2019   Influenza-Unspecified 12/22/2013   Moderna Sars-Covid-2 Vaccination 05/20/2019, 06/17/2019, 01/31/2020   Pneumococcal Conjugate-13 05/30/2013   Pneumococcal Polysaccharide-23 04/21/2012   Pneumococcal-Unspecified 01/05/2013   Tdap 01/20/2005, 07/15/2021   Zoster Recombinant(Shingrix) 05/19/2018, 12/01/2018   Zoster, Live 04/02/2007    Screening Tests Health Maintenance  Topic Date Due   DEXA SCAN  09/28/2022   COVID-19 Vaccine (4 - 2024-25 season) 11/16/2022   INFLUENZA VACCINE  10/16/2023   Medicare Annual Wellness (AWV)  11/02/2024   Colonoscopy  08/14/2025   DTaP/Tdap/Td (3 - Td or Tdap) 07/16/2031   Pneumococcal Vaccine: 50+ Years  Completed   Hepatitis C Screening  Completed   Zoster Vaccines- Shingrix  Completed   HPV VACCINES  Aged Out   Meningococcal B Vaccine  Aged Out    Health Maintenance  Health Maintenance Due  Topic Date Due   DEXA SCAN  09/28/2022   COVID-19 Vaccine (4 - 2024-25 season) 11/16/2022   INFLUENZA VACCINE  10/16/2023   Health Maintenance Items Addressed: None due at this time  Additional Screening:  Vision Screening: Recommended annual ophthalmology exams for early detection of glaucoma and other  disorders of the eye. Would you like a referral to an eye doctor? No    Dental Screening: Recommended annual dental exams for proper oral hygiene  Community Resource Referral / Chronic Care Management: CRR required this visit?  No   CCM required this visit?  No   Plan:    I have personally reviewed and noted the following in the patient's chart:   Medical and social history Use of alcohol, tobacco or illicit drugs  Current medications and supplements including opioid prescriptions. Patient is not currently taking opioid prescriptions. Functional ability and status Nutritional status Physical activity Advanced directives List of other physicians Hospitalizations, surgeries, and ER visits in previous 12 months Vitals Screenings to include cognitive, depression, and falls Referrals and appointments  In addition, I have reviewed and discussed with patient certain preventive protocols, quality metrics, and best practice recommendations. A written personalized care plan for preventive services as well as general preventive health recommendations were provided to patient.   Erminio LITTIE Saris, LPN   1/80/7974   After Visit Summary: Declined AVS says will see in Mychart  Notes: Pt says she is unsure if she needs a Bone Density (last 2 years ago) and want to  talk about genetic testing for cancer in her upcoming visit.

## 2023-11-03 NOTE — Patient Instructions (Addendum)
 Felicia Ross , Thank you for taking time out of your busy schedule to complete your Annual Wellness Visit with me. I enjoyed our conversation and look forward to speaking with you again next year. I, as well as your care team,  appreciate your ongoing commitment to your health goals. Please review the following plan we discussed and let me know if I can assist you in the future. Your Game plan/ To Do List    Referrals: If you haven't heard from the office you've been referred to, please reach out to them at the phone provided.   Follow up Visits: We will see or speak with you next year for your Next Medicare AWV with our clinical staff-11/03/24 @ 9:30am in person Have you seen your provider in the last 6 months (3 months if uncontrolled diabetes)? No  Clinician Recommendations:  Aim for 30 minutes of exercise or brisk walking, 6-8 glasses of water , and 5 servings of fruits and vegetables each day.       This is a list of the screenings recommended for you:  Health Maintenance  Topic Date Due   DEXA scan (bone density measurement)  09/28/2022   COVID-19 Vaccine (4 - 2024-25 season) 11/16/2022   Medicare Annual Wellness Visit  10/28/2023   Flu Shot  10/16/2023   Colon Cancer Screening  08/14/2025   DTaP/Tdap/Td vaccine (3 - Td or Tdap) 07/16/2031   Pneumococcal Vaccine for age over 73  Completed   Hepatitis C Screening  Completed   Zoster (Shingles) Vaccine  Completed   HPV Vaccine  Aged Out   Meningitis B Vaccine  Aged Out    Advanced directives: (Copy Requested) Please bring a copy of your health care power of attorney and living will to the office to be added to your chart at your convenience. You can mail to [facility] [address] [place], [place] [address] or email to [email or web address] Advance Care Planning is important because it:  [x]  Makes sure you receive the medical care that is consistent with your values, goals, and preferences  [x]  It provides  guidance to your family and loved ones and reduces their decisional burden about whether or not they are making the right decisions based on your wishes.  Follow the link provided in your after visit summary or read over the paperwork we have mailed to you to help you started getting your Advance Directives in place. If you need assistance in completing these, please reach out to us  so that we can help you!

## 2023-11-11 ENCOUNTER — Ambulatory Visit (INDEPENDENT_AMBULATORY_CARE_PROVIDER_SITE_OTHER): Admitting: Family

## 2023-11-11 ENCOUNTER — Encounter: Payer: Self-pay | Admitting: Family

## 2023-11-11 VITALS — BP 132/80 | HR 66 | Temp 98.1°F | Ht 67.0 in | Wt 157.6 lb

## 2023-11-11 DIAGNOSIS — R12 Heartburn: Secondary | ICD-10-CM

## 2023-11-11 DIAGNOSIS — Z0001 Encounter for general adult medical examination with abnormal findings: Secondary | ICD-10-CM | POA: Diagnosis not present

## 2023-11-11 DIAGNOSIS — R251 Tremor, unspecified: Secondary | ICD-10-CM | POA: Diagnosis not present

## 2023-11-11 DIAGNOSIS — Z79899 Other long term (current) drug therapy: Secondary | ICD-10-CM

## 2023-11-11 DIAGNOSIS — Z5181 Encounter for therapeutic drug level monitoring: Secondary | ICD-10-CM

## 2023-11-11 DIAGNOSIS — N1831 Chronic kidney disease, stage 3a: Secondary | ICD-10-CM

## 2023-11-11 DIAGNOSIS — Z13220 Encounter for screening for lipoid disorders: Secondary | ICD-10-CM

## 2023-11-11 DIAGNOSIS — M8589 Other specified disorders of bone density and structure, multiple sites: Secondary | ICD-10-CM

## 2023-11-11 DIAGNOSIS — J302 Other seasonal allergic rhinitis: Secondary | ICD-10-CM | POA: Diagnosis not present

## 2023-11-11 DIAGNOSIS — Z8041 Family history of malignant neoplasm of ovary: Secondary | ICD-10-CM | POA: Diagnosis not present

## 2023-11-11 DIAGNOSIS — Z0000 Encounter for general adult medical examination without abnormal findings: Secondary | ICD-10-CM

## 2023-11-11 LAB — COMPREHENSIVE METABOLIC PANEL WITH GFR
ALT: 15 U/L (ref 0–35)
AST: 17 U/L (ref 0–37)
Albumin: 4.5 g/dL (ref 3.5–5.2)
Alkaline Phosphatase: 68 U/L (ref 39–117)
BUN: 19 mg/dL (ref 6–23)
CO2: 28 meq/L (ref 19–32)
Calcium: 9.2 mg/dL (ref 8.4–10.5)
Chloride: 103 meq/L (ref 96–112)
Creatinine, Ser: 0.86 mg/dL (ref 0.40–1.20)
GFR: 65.46 mL/min (ref >60.00)
Glucose, Bld: 95 mg/dL (ref 70–99)
Potassium: 4.5 meq/L (ref 3.5–5.1)
Sodium: 141 meq/L (ref 135–145)
Total Bilirubin: 0.8 mg/dL (ref 0.2–1.2)
Total Protein: 7.4 g/dL (ref 6.0–8.3)

## 2023-11-11 LAB — BASIC METABOLIC PANEL WITH GFR
BUN: 19 mg/dL (ref 6–23)
CO2: 28 meq/L (ref 19–32)
Calcium: 9.2 mg/dL (ref 8.4–10.5)
Chloride: 103 meq/L (ref 96–112)
Creatinine, Ser: 0.86 mg/dL (ref 0.40–1.20)
GFR: 65.46 mL/min (ref >60.00)
Glucose, Bld: 95 mg/dL (ref 70–99)
Potassium: 4.5 meq/L (ref 3.5–5.1)
Sodium: 141 meq/L (ref 135–145)

## 2023-11-11 LAB — LIPID PANEL
Cholesterol: 181 mg/dL (ref 0–200)
HDL: 66.4 mg/dL (ref >39.00)
LDL Cholesterol: 92 mg/dL (ref 0–99)
NonHDL: 114.85
Total CHOL/HDL Ratio: 3
Triglycerides: 116 mg/dL (ref 0.0–149.0)
VLDL: 23.2 mg/dL (ref 0.0–40.0)

## 2023-11-11 LAB — CBC
HCT: 43.7 % (ref 36.0–46.0)
Hemoglobin: 14.2 g/dL (ref 12.0–15.0)
MCHC: 32.4 g/dL (ref 30.0–36.0)
MCV: 90.9 fl (ref 78.0–100.0)
Platelets: 184 K/uL (ref 150.0–400.0)
RBC: 4.8 Mil/uL (ref 3.87–5.11)
RDW: 13.6 % (ref 11.5–15.5)
WBC: 5.7 K/uL (ref 4.0–10.5)

## 2023-11-11 LAB — VITAMIN B12: Vitamin B-12: 680 pg/mL (ref 211–911)

## 2023-11-11 MED ORDER — OMEPRAZOLE 20 MG PO CPDR: 20.0000 mg | DELAYED_RELEASE_CAPSULE | Freq: Every day | ORAL | 0 refills | Status: DC

## 2023-11-11 MED ORDER — FLUTICASONE PROPIONATE 50 MCG/ACT NA SUSP: 2.0000 | Freq: Every day | NASAL | Status: AC

## 2023-11-11 NOTE — Patient Instructions (Signed)
 I have sent an electronic order over to your preferred location for the following:   []   Diagnostic mammogram and u/s x 6 months for stability  []   Bone Density   Please give this center a call to get scheduled at your convenience.  [x]   [facility]  [address] [place] [address]  [phone]  Make sure to wear two piece  clothing  No lotions powders or deodorants the day of the appointment Make sure to bring picture ID and insurance card.  Bring list of medications you are currently taking including any supplements.

## 2023-11-11 NOTE — Progress Notes (Addendum)
 23  Subjective:  Patient ID: Felicia Ross, female    DOB: [birth year]  Age: 78 y.o. MRN: [id number]  Patient Care Team: [name], FNP as PCP - General (Family Medicine) Pa, [facility])   CC:  Chief Complaint  Patient presents with   Annual Exam    HPI Felicia Ross is a 78 y.o. female who presents today for an annual physical exam. She reports consuming a general diet. Very active, [place] farm  She generally feels well. She reports sleeping fairly well. She does not have additional problems to discuss today.   Vision:Within last year Dental:Receives regular dental care  Mammogram: 10/16/23 mammogram u/s biopsy , benign repeat dx mammogram and u/s in 6 months  Colonoscopy: 08/15/22 > 76 y/o no longer needs  Bone density scan: 2021 ordering   Pt is without acute concerns.   Discussed the use of AI scribe software for clinical note transcription with the patient, who gave verbal consent to proceed.  History of Present Illness Felicia Ross is a 78 year old female with essential tremors who presents for follow-up on her tremor management and medication adjustments.  She experiences hand tremors, primarily in the right hand. She consulted with a neurologist who initially prescribed gabapentin but has since switched her to primidone. She started with 25 mg nightly for two weeks and then increased to 50 mg nightly. She noticed a significant improvement in her tremors after the medication adjustment, although she experienced some dizziness with the higher dose and reverted to half a tablet, which has been effective in controlling the tremors.  She is also taking mirtazapine  15 mg nightly. She previously took gabapentin but has discontinued it. Additionally, she takes lithium  at 150 mg in the morning and 300 mg at night. She has a history of thyroidectomy in 2011 and is on levothyroxine  112 mcg daily. Her thyroid  levels have been stable. She also takes  simvastatin  for cholesterol management.  She reports experiencing heartburn and occasionally uses Prilosec, with a bottle of extra strength at home. No significant gastrointestinal symptoms such as constipation or diarrhea. She occasionally experiences crusty eyes in the morning and uses Flonase  for allergies. She does not take any oral allergy medications like Allegra or Claritin.  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 She is up to date with her eye and dental exams, and she had a mammogram and ultrasound last year, which were benign. She also has osteopenia in the left hip, left forearm, and lower back, noted in a bone density exam from July 2022.   Advanced Directives Patient does have advanced directives. She does not have a copy in the electronic medical record.   DEPRESSION SCREENING    11/11/2023   10:12 AM 11/03/2023    9:44 AM 10/28/2022    8:53 AM 07/01/2022    9:18 AM 07/18/2021   12:42 PM 06/18/2020    8:45 AM 03/06/2020    8:50 AM  PHQ 2/9 Scores  PHQ - 2 Score 0 0 0 0 0 0 0  PHQ- 9 Score 1     1      ROS: Negative unless specifically indicated above in HPI.    Current Outpatient Medications:    omeprazole  (PRILOSEC) 20 MG capsule, Take 1 capsule (20 mg total) by mouth daily., Disp: 30 capsule, Rfl: 0   Cholecalciferol (VITAMIN D ) 2000 units CAPS, Take 1 capsule by mouth daily., Disp: , Rfl:  fluticasone  (FLONASE ) 50 MCG/ACT nasal spray, Place 2 sprays into both nostrils daily., Disp: , Rfl:    ipratropium (ATROVENT) 0.03 % nasal spray, Place into both nostrils., Disp: , Rfl:    levothyroxine  (SYNTHROID ) 112 MCG tablet, TAKE 1 TABLET EVERY DAY, Disp: 90 tablet, Rfl: 0   lithium  carbonate (LITHOBID ) 300 MG ER tablet, TAKE 1 TABLET TWICE DAILY, Disp: 180 tablet, Rfl: 3   mirtazapine  (REMERON ) 15 MG tablet, TAKE 1 TABLET AT BEDTIME, Disp: 90 tablet, Rfl: 3   Multiple Vitamin (MULTIVITAMIN) tablet, Take 1 tablet by mouth daily., Disp: , Rfl:     Omega-3 Fatty Acids (FISH OIL) 1200 MG CAPS, Take 1,200 mg by mouth 2 (two) times daily. , Disp: , Rfl:    primidone (MYSOLINE) 50 MG tablet, Take 50 mg by mouth daily., Disp: , Rfl:    simvastatin  (ZOCOR ) 10 MG tablet, TAKE 1 TABLET AT BEDTIME, Disp: 90 tablet, Rfl: 3    Objective:    BP 132/80 (BP Location: Left Arm, Patient Position: Sitting, Cuff Size: Normal)   Pulse 66   Temp 98.1 F (36.7 C) (Temporal)   Ht 5' 7 (1.702 m)   Wt 157 lb 9.6 oz (71.5 kg)   SpO2 96%   BMI 24.68 kg/m   BP Readings from Last 3 Encounters:  11/11/23 132/80  11/03/23 122/80  08/15/22 132/63      Physical Exam Vitals reviewed.  Constitutional:      General: She is not in acute distress.    Appearance: Normal appearance. She is normal weight. She is not ill-appearing.  HENT:     Head: Normocephalic.     Right Ear: Tympanic membrane normal.     Left Ear: Tympanic membrane normal.     Nose: Nose normal.     Mouth/Throat:     Mouth: Mucous membranes are moist.  Eyes:     Extraocular Movements: Extraocular movements intact.     Pupils: Pupils are equal, round, and reactive to light.  Cardiovascular:     Rate and Rhythm: Normal rate and regular rhythm.  Pulmonary:     Effort: Pulmonary effort is normal.     Breath sounds: Normal breath sounds.  Musculoskeletal:        General: Normal range of motion.     Cervical back: Normal range of motion.  Skin:    General: Skin is warm.     Capillary Refill: Capillary refill takes less than 2 seconds.  Neurological:     General: No focal deficit present.     Mental Status: She is alert.  Psychiatric:        Mood and Affect: Mood normal.        Behavior: Behavior normal.        Thought Content: Thought content normal.        Judgment: Judgment normal.       Results RADIOLOGY Mammogram: benign (10/16/2023)  DIAGNOSTIC Bone density exam: osteopenia in left hip, left forearm, lumbar spine (09/27/2020)      Assessment & Plan:    Assessment and Plan Assessment & Plan Essential tremor and medication-induced tremor, right hand Essential tremor primarily affecting the right hand. Symptoms improved with primidone 25 mg nightly, but increased to 50 mg nightly caused dizziness, so reverted to 25 mg nightly. Symptoms not consistent with Parkinson's disease. - Continue primidone 25 mg nightly - Follow up with neurologist in 6 months  Hypothyroidism post-thyroidectomy Hypothyroidism managed post-thyroidectomy since 2011. Current thyroid  function tests are well-controlled on  levothyroxine  112 mcg daily. - Continue levothyroxine  112 mcg daily - Continue annual follow-up with endocrinologist  Hyperlipidemia Hyperlipidemia managed with simvastatin . - Continue simvastatin   Chronic kidney disease, stage unspecified Chronic kidney disease with renal function slightly below 60, consistent with aging.  Gastroesophageal reflux disease (GERD) GERD with symptoms of heartburn and throat redness, possibly exacerbated by dietary factors such as chocolate and caffeine. - Take Prilosec for 2 weeks - Send prescription for Prilosec to pharmacy  Allergic rhinitis Allergic rhinitis with symptoms of nasal drainage and crusty eyes in the morning. Currently using Flonase  nasal spray. - Consider adding Allegra over-the-counter during allergy season  Osteopenia, left hip, left forearm, and lumbar spine Osteopenia diagnosed in the left hip, left forearm, and lumbar spine. Last bone density exam was in July 2022. - Schedule bone density exam in 6 months, ideally on the same day as the mammogram  Benign breast lesion, left Benign breast lesion in the left breast, previously biopsied and found to be benign. Follow-up imaging recommended out of caution. - Schedule diagnostic mammogram and ultrasound in 6 months - Provide contact information for [facility] for scheduling  Annual wellness visit  Patient Counseling(The following  topics were reviewed):  Preventative care handout given to pt  Health maintenance and immunizations reviewed. Please refer to Health maintenance section. Pt advised on safe sex, wearing seatbelts in car, and proper nutrition labwork ordered today for annual Dental health: Discussed importance of regular tooth brushing, flossing, and dental visits.  Recording duration: 16 minutes        Follow-up: Return in about 1 year (around 11/10/2024) for f/u CPE.   Ginger Patrick, FNP

## 2023-11-12 ENCOUNTER — Ambulatory Visit: Payer: Self-pay | Admitting: Family

## 2023-11-12 NOTE — Progress Notes (Signed)
 Cholesterol looks great.  Labs all normal range.  Pending lithium  level.

## 2023-11-13 LAB — LITHIUM LEVEL: Lithium Lvl: 0.5 mmol/L — ABNORMAL LOW (ref 0.6–1.2)

## 2023-11-14 NOTE — Progress Notes (Signed)
Lithium level stable

## 2023-11-18 NOTE — Telephone Encounter (Signed)
 Copied from CRM #[id number]. Topic: General - Call Back - No Documentation >> Nov 18, 2023  8:24 AM Frederich PARAS wrote: Reason for CRM: pt called in regards to a missed call she had, looks like lindsay lemons reached out and was unable to get the pt. Please call pt back at earliest convenience. [phone] callback# >> Nov 18, 2023  8:30 AM Frederich PARAS wrote: Pt says she is only available until 1pm

## 2023-12-24 ENCOUNTER — Ambulatory Visit
Admission: RE | Admit: 2023-12-24 | Discharge: 2023-12-24 | Disposition: A | Discharge status: Home / Self Care | Source: Ambulatory Visit | Attending: Family | Admitting: Family

## 2023-12-24 DIAGNOSIS — M8589 Other specified disorders of bone density and structure, multiple sites: Secondary | ICD-10-CM | POA: Diagnosis not present

## 2023-12-24 DIAGNOSIS — Z780 Asymptomatic menopausal state: Secondary | ICD-10-CM | POA: Diagnosis not present

## 2024-01-02 ENCOUNTER — Other Ambulatory Visit: Payer: Self-pay | Admitting: Family

## 2024-01-02 DIAGNOSIS — F3170 Bipolar disorder, currently in remission, most recent episode unspecified: Secondary | ICD-10-CM

## 2024-01-05 DIAGNOSIS — R09A2 Foreign body sensation, throat: Secondary | ICD-10-CM | POA: Diagnosis not present

## 2024-01-05 DIAGNOSIS — K219 Gastro-esophageal reflux disease without esophagitis: Secondary | ICD-10-CM | POA: Diagnosis not present

## 2024-01-05 DIAGNOSIS — R053 Chronic cough: Secondary | ICD-10-CM | POA: Diagnosis not present

## 2024-01-29 ENCOUNTER — Other Ambulatory Visit: Payer: Self-pay | Admitting: Family

## 2024-01-29 DIAGNOSIS — E890 Postprocedural hypothyroidism: Secondary | ICD-10-CM

## 2024-03-02 ENCOUNTER — Ambulatory Visit: Payer: Self-pay

## 2024-03-02 NOTE — Telephone Encounter (Signed)
°  FYI Only or Action Required?: FYI only for provider: appointment scheduled on 03/03/24.  Patient was last seen in primary care on 11/11/2023 by [name], FNP.  Called Nurse Triage reporting Dizziness.  Symptoms began several weeks ago.  Interventions attempted: Nothing.  Symptoms are: gradually worsening.  Triage Disposition: See Physician Within 24 Hours  Patient/caregiver understands and will follow disposition?: Yes   Copied from CRM #[id number]. Topic: Clinical - Red Word Triage >> Mar 02, 2024  8:06 AM Robinson H wrote: [facility] that prompted transfer to Nurse Triage: Dizziness and lightheaded, that's increasing over the last 2 weeks thought it was because [facility] changed source of thyroid  medication and medication looks different confirmed it was correct medication with thyroid  doctor Reason for Disposition  [1] MODERATE dizziness (e.g., interferes with normal activities) AND [2] has NOT been evaluated by doctor (or NP/PA) for this  (Exception: Dizziness caused by heat exposure, sudden standing, or poor fluid intake.)  Answer Assessment - Initial Assessment Questions 1. DESCRIPTION: Describe your dizziness.  Pt states she is dizzy, denies room spinning       2. LIGHTHEADED: Do you feel lightheaded? (e.g., somewhat faint, woozy, weak upon standing) Pt states she is lightheaded       3. VERTIGO: Do you feel like either you or the room is spinning or tilting? (i.e., vertigo)    Pt denies vertigo    4. SEVERITY: How bad is it?  Do you feel like you are going to faint? Can you stand and walk?      Pt states she has to sit when walking due to increased dizziness  5. ONSET:  When did the dizziness begin? 2 weeks       6. AGGRAVATING FACTORS: Does anything make it worse? (e.g., standing, change in head position)      Pt states has dizziness even when sitting that comes and goes   7. CAUSE: What do you think is causing the dizziness? (e.g., decreased  fluids or food, diarrhea, emotional distress, heat exposure, new medicine, sudden standing, vomiting; unknown)      Pt states she thought her thyroid  medication was changed and is the cause, however, unsure  8. RECURRENT SYMPTOM: Have you had dizziness before? If Yes, ask: When was the last time? What happened that time?      Pt states symptoms are recurrent  9. OTHER SYMPTOMS:  PT denies fever, chest pain, vomiting, diarrhea, bleeding          Pt called in with reports of dizziness. She suspected her thyroid  levels were abnormal, however, had them checked. Pt states she feels as though the dizziness is increasing. Pt scheduled for visit with PCP and states she will call back if symptoms worsen.  Protocols used: Dizziness - Lightheadedness-A-AH

## 2024-03-02 NOTE — Telephone Encounter (Signed)
 NOTED

## 2024-03-03 ENCOUNTER — Ambulatory Visit: Payer: Self-pay | Admitting: Family

## 2024-03-03 ENCOUNTER — Ambulatory Visit: Admitting: Family

## 2024-03-03 ENCOUNTER — Encounter: Payer: Self-pay | Admitting: Family

## 2024-03-03 VITALS — BP 126/82 | HR 87 | Temp 98.0°F | Ht 67.0 in | Wt 160.6 lb

## 2024-03-03 DIAGNOSIS — R42 Dizziness and giddiness: Secondary | ICD-10-CM

## 2024-03-03 DIAGNOSIS — H8111 Benign paroxysmal vertigo, right ear: Secondary | ICD-10-CM

## 2024-03-03 DIAGNOSIS — Z5181 Encounter for therapeutic drug level monitoring: Secondary | ICD-10-CM | POA: Diagnosis not present

## 2024-03-03 DIAGNOSIS — Z79899 Other long term (current) drug therapy: Secondary | ICD-10-CM | POA: Diagnosis not present

## 2024-03-03 LAB — BASIC METABOLIC PANEL WITH GFR
BUN: 17 mg/dL (ref 6–23)
CO2: 30 meq/L (ref 19–32)
Calcium: 9.5 mg/dL (ref 8.4–10.5)
Chloride: 101 meq/L (ref 96–112)
Creatinine, Ser: 0.85 mg/dL (ref 0.40–1.20)
GFR: 66.24 mL/min (ref >60.00)
Glucose, Bld: 83 mg/dL (ref 70–99)
Potassium: 4.2 meq/L (ref 3.5–5.1)
Sodium: 140 meq/L (ref 135–145)

## 2024-03-03 LAB — CBC
HCT: 44.6 % (ref 36.0–46.0)
Hemoglobin: 14.9 g/dL (ref 12.0–15.0)
MCHC: 33.3 g/dL (ref 30.0–36.0)
MCV: 89.9 fl (ref 78.0–100.0)
Platelets: 211 K/uL (ref 150.0–400.0)
RBC: 4.96 Mil/uL (ref 3.87–5.11)
RDW: 13.7 % (ref 11.5–15.5)
WBC: 6.3 K/uL (ref 4.0–10.5)

## 2024-03-03 LAB — IBC + FERRITIN
Ferritin: 141.1 ng/mL (ref 10.0–291.0)
Iron: 134 ug/dL (ref 42–145)
Saturation Ratios: 44.3 % (ref 20.0–50.0)
TIBC: 302.4 ug/dL (ref 250.0–450.0)
Transferrin: 216 mg/dL (ref 212.0–360.0)

## 2024-03-03 NOTE — Progress Notes (Signed)
 Lab work came back without acute findings.

## 2024-03-03 NOTE — Progress Notes (Signed)
 Established Patient Office Visit  Subjective:      CC:  Chief Complaint  Patient presents with   Dizziness    Onset was 2 weeks ago    HPI: Felicia Ross is a 78 y.o. female presenting on 03/03/2024 for Dizziness (Onset was 2 weeks ago) .  Discussed the use of AI scribe software for clinical note transcription with the patient, who gave verbal consent to proceed.  History of Present Illness Felicia Ross is a 78 year old female who presents with dizziness and lightheadedness.  She has been experiencing dizziness and lightheadedness for the past two weeks, beginning on a Sunday morning. The dizziness is described as a sensation of her head being 'swimmy' without vertigo. It is intermittent, worsens in the morning, and is not significantly affected by positional changes, although she feels dizzy when lying down and turning her head to the right. She feels more unsteady while walking and holds onto objects to prevent falling.  She initially suspected her thyroid  medication might be the cause due to a change in the pill's appearance, but her thyroid  levels were checked and found to be normal. She attempted to alleviate the symptoms by taking an emergency immune supplement, which provided temporary relief.  No sinus pressure, ear discomfort, blurry vision, chest pain, palpitations, or shortness of breath. She is currently taking primidone at half a tablet for hand tremors.         Social history:  Relevant past medical, surgical, family and social history reviewed and updated as indicated. Interim medical history since our last visit reviewed.  Allergies and medications reviewed and updated.  DATA REVIEWED: CHART IN EPIC     ROS: Negative unless specifically indicated above in HPI.   Current Medications[1]        Objective:        BP 126/82 (BP Location: Left Arm, Patient Position: Sitting, Cuff Size: Normal)   Pulse 87   Temp 98 F (36.7 C) (Temporal)    Ht 5' 7 (1.702 m)   Wt 160 lb 9.6 oz (72.8 kg)   SpO2 98%   BMI 25.15 kg/m   Physical Exam VITALS: P- 82, BP- 138/78 NEUROLOGICAL: Positional vertigo present.  Wt Readings from Last 3 Encounters:  03/03/24 160 lb 9.6 oz (72.8 kg)  11/11/23 157 lb 9.6 oz (71.5 kg)  11/03/23 157 lb 9.6 oz (71.5 kg)    Physical Exam Vitals reviewed.  Constitutional:      General: She is not in acute distress.    Appearance: Normal appearance. She is normal weight. She is not ill-appearing, toxic-appearing or diaphoretic.  HENT:     Head: Normocephalic.     Right Ear: Tympanic membrane normal.     Left Ear: Tympanic membrane normal.     Nose: Nose normal.     Mouth/Throat:     Mouth: Mucous membranes are dry.     Pharynx: No oropharyngeal exudate or posterior oropharyngeal erythema.  Eyes:     Extraocular Movements: Extraocular movements intact.     Pupils: Pupils are equal, round, and reactive to light.  Cardiovascular:     Rate and Rhythm: Normal rate and regular rhythm.     Pulses: Normal pulses.     Heart sounds: Normal heart sounds.  Pulmonary:     Effort: Pulmonary effort is normal.     Breath sounds: Normal breath sounds.  Musculoskeletal:     Cervical back: Normal range of motion.  Neurological:  General: No focal deficit present.     Mental Status: She is alert and oriented to person, place, and time. Mental status is at baseline.     Cranial Nerves: Cranial nerves 2-12 are intact. No cranial nerve deficit or facial asymmetry.     Coordination: Coordination is intact.     Gait: Gait is intact.     Comments: Positive dix hallpyke worse with right external rotation of neck   Psychiatric:        Mood and Affect: Mood normal.        Behavior: Behavior normal.        Thought Content: Thought content normal.        Judgment: Judgment normal.          Results Labs TSH: Within normal limits  Assessment & Plan:   Assessment and Plan Assessment & Plan Benign  paroxysmal positional vertigo of right ear Intermittent dizziness and lightheadedness for two weeks, worse in the morning and with positional changes. No associated nausea, chest pain, or shortness of breath. Symptoms suggestive of benign paroxysmal positional vertigo (BPPV) of the right ear, likely due to displaced inner ear crystals. Orthostatic vital signs are reassuring, ruling out orthostatic hypotension. Differential diagnosis includes anemia, thyroid  dysfunction, and lithium  toxicity, but these are less likely given normal thyroid  levels and stable lithium  levels. - Ordered blood tests to check for anemia, lithium  levels, electrolytes, and kidney function. - Prescribed meclizine (Antivert) as needed for vertigo spells, starting with half a tablet due to potential drowsiness. - Prescribed prednisone  to reduce inflammation. - Advised follow-up with ENT if symptoms persist for 2-3 days for possible Epley maneuver.  Encounter for lithium  monitoring Lithium  levels are well-managed and within normal range. - Continue monitoring lithium  levels as part of routine care.        Return in about 2 weeks (around 03/17/2024) for if no improvement of symptoms .     Ginger Patrick, MSN, APRN, FNP-C [facility]        [1]  Current Outpatient Medications:    Cholecalciferol (VITAMIN D ) 2000 units CAPS, Take 1 capsule by mouth daily., Disp: , Rfl:    fluticasone  (FLONASE ) 50 MCG/ACT nasal spray, Place 2 sprays into both nostrils daily., Disp: , Rfl:    levothyroxine  (SYNTHROID ) 112 MCG tablet, TAKE 1 TABLET EVERY DAY, Disp: 90 tablet, Rfl: 3   lithium  carbonate (LITHOBID ) 300 MG ER tablet, TAKE 1 TABLET TWICE DAILY, Disp: 180 tablet, Rfl: 3   meclizine (ANTIVERT) 25 MG tablet, Take 1/2 to one tablet po tid prn vertigo, Disp: 30 tablet, Rfl: 0   mirtazapine  (REMERON ) 15 MG tablet, TAKE 1 TABLET AT BEDTIME, Disp: 90 tablet, Rfl: 3   Multiple Vitamin (MULTIVITAMIN)  tablet, Take 1 tablet by mouth daily., Disp: , Rfl:    Omega-3 Fatty Acids (FISH OIL) 1200 MG CAPS, Take 1,200 mg by mouth 2 (two) times daily. , Disp: , Rfl:    predniSONE  (DELTASONE ) 20 MG tablet, Take two tablets once daily for five days, Disp: 10 tablet, Rfl: 0   primidone (MYSOLINE) 50 MG tablet, Take 50 mg by mouth daily., Disp: , Rfl:    simvastatin  (ZOCOR ) 10 MG tablet, TAKE 1 TABLET AT BEDTIME, Disp: 90 tablet, Rfl: 3

## 2024-03-04 LAB — LITHIUM LEVEL: Lithium Lvl: 0.4 mmol/L — ABNORMAL LOW (ref 0.6–1.2)

## 2024-05-03 ENCOUNTER — Other Ambulatory Visit

## 2024-05-03 ENCOUNTER — Encounter

## 2024-11-03 ENCOUNTER — Ambulatory Visit

## 2024-11-04 ENCOUNTER — Ambulatory Visit
